# Patient Record
Sex: Female | Born: 1970 | Race: White | Hispanic: No | Smoking: Former smoker
Health system: Southern US, Community
[De-identification: ages and names within clinical notes are randomized; demographics above are authoritative.]

## PROBLEM LIST (undated history)

## (undated) DIAGNOSIS — I82409 Acute embolism and thrombosis of unspecified deep veins of unspecified lower extremity: Secondary | ICD-10-CM

## (undated) DIAGNOSIS — E039 Hypothyroidism, unspecified: Secondary | ICD-10-CM

## (undated) DIAGNOSIS — C50919 Malignant neoplasm of unspecified site of unspecified female breast: Secondary | ICD-10-CM

## (undated) DIAGNOSIS — M8580 Other specified disorders of bone density and structure, unspecified site: Secondary | ICD-10-CM

## (undated) DIAGNOSIS — E079 Disorder of thyroid, unspecified: Secondary | ICD-10-CM

## (undated) DIAGNOSIS — Z803 Family history of malignant neoplasm of breast: Secondary | ICD-10-CM

## (undated) DIAGNOSIS — S83522A Sprain of posterior cruciate ligament of left knee, initial encounter: Secondary | ICD-10-CM

## (undated) DIAGNOSIS — Z1509 Genetic susceptibility to other malignant neoplasm: Secondary | ICD-10-CM

## (undated) DIAGNOSIS — F329 Major depressive disorder, single episode, unspecified: Secondary | ICD-10-CM

## (undated) DIAGNOSIS — Z1501 Genetic susceptibility to malignant neoplasm of breast: Secondary | ICD-10-CM

## (undated) DIAGNOSIS — Z1379 Encounter for other screening for genetic and chromosomal anomalies: Secondary | ICD-10-CM

## (undated) DIAGNOSIS — Z1589 Genetic susceptibility to other disease: Secondary | ICD-10-CM

## (undated) DIAGNOSIS — F32A Depression, unspecified: Secondary | ICD-10-CM

## (undated) DIAGNOSIS — F4310 Post-traumatic stress disorder, unspecified: Secondary | ICD-10-CM

## (undated) DIAGNOSIS — F322 Major depressive disorder, single episode, severe without psychotic features: Secondary | ICD-10-CM

## (undated) DIAGNOSIS — S83242A Other tear of medial meniscus, current injury, left knee, initial encounter: Secondary | ICD-10-CM

## (undated) HISTORY — DX: Major depressive disorder, single episode, severe without psychotic features: F32.2

## (undated) HISTORY — DX: Disorder of thyroid, unspecified: E07.9

## (undated) HISTORY — DX: Post-traumatic stress disorder, unspecified: F43.10

## (undated) HISTORY — DX: Acute embolism and thrombosis of unspecified deep veins of unspecified lower extremity: I82.409

## (undated) HISTORY — DX: Genetic susceptibility to other malignant neoplasm: Z15.09

## (undated) HISTORY — DX: Genetic susceptibility to malignant neoplasm of breast: Z15.89

## (undated) HISTORY — DX: Family history of malignant neoplasm of breast: Z80.3

## (undated) HISTORY — DX: Depression, unspecified: F32.A

## (undated) HISTORY — DX: Other specified disorders of bone density and structure, unspecified site: M85.80

## (undated) HISTORY — DX: Major depressive disorder, single episode, unspecified: F32.9

## (undated) HISTORY — DX: Genetic susceptibility to malignant neoplasm of breast: Z15.01

## (undated) HISTORY — DX: Encounter for other screening for genetic and chromosomal anomalies: Z13.79

---

## 1998-06-16 ENCOUNTER — Emergency Department (HOSPITAL_COMMUNITY): Admission: EM | Admit: 1998-06-16 | Discharge: 1998-06-16 | Payer: Self-pay | Admitting: Emergency Medicine

## 1998-10-22 ENCOUNTER — Inpatient Hospital Stay (HOSPITAL_COMMUNITY): Admission: EM | Admit: 1998-10-22 | Discharge: 1998-10-24 | Payer: Self-pay | Admitting: Emergency Medicine

## 2003-11-05 ENCOUNTER — Emergency Department (HOSPITAL_COMMUNITY): Admission: EM | Admit: 2003-11-05 | Discharge: 2003-11-05 | Payer: Self-pay | Admitting: Emergency Medicine

## 2005-05-06 ENCOUNTER — Emergency Department (HOSPITAL_COMMUNITY): Admission: EM | Admit: 2005-05-06 | Discharge: 2005-05-06 | Payer: Self-pay | Admitting: Emergency Medicine

## 2005-10-29 ENCOUNTER — Inpatient Hospital Stay (HOSPITAL_COMMUNITY): Admission: AD | Admit: 2005-10-29 | Discharge: 2005-10-29 | Payer: Self-pay | Admitting: *Deleted

## 2006-06-01 ENCOUNTER — Emergency Department: Payer: Self-pay | Admitting: Emergency Medicine

## 2006-06-03 ENCOUNTER — Inpatient Hospital Stay (HOSPITAL_COMMUNITY): Admission: AD | Admit: 2006-06-03 | Discharge: 2006-06-03 | Payer: Self-pay | Admitting: Gynecology

## 2007-09-26 ENCOUNTER — Encounter (INDEPENDENT_AMBULATORY_CARE_PROVIDER_SITE_OTHER): Payer: Self-pay | Admitting: Unknown Physician Specialty

## 2007-09-26 ENCOUNTER — Other Ambulatory Visit: Admission: RE | Admit: 2007-09-26 | Discharge: 2007-09-26 | Payer: Self-pay | Admitting: Unknown Physician Specialty

## 2008-02-19 ENCOUNTER — Emergency Department (HOSPITAL_COMMUNITY): Admission: EM | Admit: 2008-02-19 | Discharge: 2008-02-19 | Payer: Self-pay | Admitting: Emergency Medicine

## 2008-04-01 ENCOUNTER — Ambulatory Visit: Payer: Self-pay | Admitting: Internal Medicine

## 2008-04-01 DIAGNOSIS — F32A Depression, unspecified: Secondary | ICD-10-CM | POA: Insufficient documentation

## 2008-04-01 DIAGNOSIS — E785 Hyperlipidemia, unspecified: Secondary | ICD-10-CM | POA: Insufficient documentation

## 2008-04-01 DIAGNOSIS — F329 Major depressive disorder, single episode, unspecified: Secondary | ICD-10-CM

## 2008-04-01 DIAGNOSIS — D509 Iron deficiency anemia, unspecified: Secondary | ICD-10-CM | POA: Insufficient documentation

## 2008-04-01 DIAGNOSIS — R259 Unspecified abnormal involuntary movements: Secondary | ICD-10-CM | POA: Insufficient documentation

## 2008-04-01 DIAGNOSIS — Z87442 Personal history of urinary calculi: Secondary | ICD-10-CM | POA: Insufficient documentation

## 2008-04-02 ENCOUNTER — Telehealth: Payer: Self-pay | Admitting: Internal Medicine

## 2008-04-05 ENCOUNTER — Telehealth: Payer: Self-pay | Admitting: Internal Medicine

## 2008-04-08 ENCOUNTER — Encounter (HOSPITAL_COMMUNITY): Admission: RE | Admit: 2008-04-08 | Discharge: 2008-04-09 | Payer: Self-pay | Admitting: Internal Medicine

## 2008-04-17 ENCOUNTER — Telehealth: Payer: Self-pay | Admitting: Internal Medicine

## 2008-04-23 ENCOUNTER — Encounter: Payer: Self-pay | Admitting: Internal Medicine

## 2008-04-26 ENCOUNTER — Ambulatory Visit: Payer: Self-pay | Admitting: Internal Medicine

## 2008-04-26 DIAGNOSIS — E0500 Thyrotoxicosis with diffuse goiter without thyrotoxic crisis or storm: Secondary | ICD-10-CM | POA: Insufficient documentation

## 2008-05-24 ENCOUNTER — Ambulatory Visit: Payer: Self-pay | Admitting: Internal Medicine

## 2008-07-25 ENCOUNTER — Ambulatory Visit: Payer: Self-pay | Admitting: Internal Medicine

## 2008-07-26 ENCOUNTER — Ambulatory Visit: Payer: Self-pay | Admitting: Internal Medicine

## 2008-07-30 ENCOUNTER — Telehealth: Payer: Self-pay | Admitting: Internal Medicine

## 2008-07-30 LAB — CONVERTED CEMR LAB: TSH: 73.46 microintl units/mL — ABNORMAL HIGH (ref 0.35–5.50)

## 2008-08-13 ENCOUNTER — Ambulatory Visit: Payer: Self-pay | Admitting: Internal Medicine

## 2008-08-13 DIAGNOSIS — J029 Acute pharyngitis, unspecified: Secondary | ICD-10-CM | POA: Insufficient documentation

## 2009-02-24 ENCOUNTER — Ambulatory Visit: Payer: Self-pay | Admitting: Internal Medicine

## 2009-02-24 DIAGNOSIS — E039 Hypothyroidism, unspecified: Secondary | ICD-10-CM | POA: Insufficient documentation

## 2009-03-24 ENCOUNTER — Ambulatory Visit: Payer: Self-pay | Admitting: Internal Medicine

## 2009-09-24 ENCOUNTER — Ambulatory Visit (HOSPITAL_COMMUNITY): Admission: RE | Admit: 2009-09-24 | Discharge: 2009-09-24 | Payer: Self-pay | Admitting: Family Medicine

## 2009-09-30 ENCOUNTER — Encounter: Admission: RE | Admit: 2009-09-30 | Discharge: 2009-09-30 | Payer: Self-pay | Admitting: General Surgery

## 2009-10-08 ENCOUNTER — Encounter (INDEPENDENT_AMBULATORY_CARE_PROVIDER_SITE_OTHER): Payer: Self-pay | Admitting: General Surgery

## 2009-10-08 ENCOUNTER — Inpatient Hospital Stay (HOSPITAL_COMMUNITY): Admission: RE | Admit: 2009-10-08 | Discharge: 2009-10-10 | Payer: Self-pay | Admitting: General Surgery

## 2009-10-08 HISTORY — PX: MODIFIED RADICAL MASTECTOMY: SHX5703

## 2009-11-05 ENCOUNTER — Encounter (HOSPITAL_COMMUNITY): Admission: RE | Admit: 2009-11-05 | Discharge: 2009-12-05 | Payer: Self-pay | Admitting: Oncology

## 2009-11-05 ENCOUNTER — Ambulatory Visit (HOSPITAL_COMMUNITY): Payer: Self-pay | Admitting: Oncology

## 2009-11-07 ENCOUNTER — Ambulatory Visit: Payer: Self-pay | Admitting: Cardiology

## 2009-11-07 ENCOUNTER — Ambulatory Visit: Admission: RE | Admit: 2009-11-07 | Discharge: 2010-01-19 | Payer: Self-pay | Admitting: Radiation Oncology

## 2009-11-07 ENCOUNTER — Encounter (HOSPITAL_COMMUNITY): Payer: Self-pay | Admitting: Oncology

## 2009-11-14 ENCOUNTER — Ambulatory Visit (HOSPITAL_COMMUNITY): Admission: RE | Admit: 2009-11-14 | Discharge: 2009-11-14 | Payer: Self-pay | Admitting: General Surgery

## 2009-11-14 HISTORY — PX: PORTACATH PLACEMENT: SHX2246

## 2009-11-24 ENCOUNTER — Ambulatory Visit: Payer: Self-pay | Admitting: Internal Medicine

## 2009-12-16 ENCOUNTER — Encounter (HOSPITAL_COMMUNITY): Admission: RE | Admit: 2009-12-16 | Discharge: 2010-01-15 | Payer: Self-pay | Admitting: Oncology

## 2009-12-30 ENCOUNTER — Ambulatory Visit (HOSPITAL_COMMUNITY): Payer: Self-pay | Admitting: Oncology

## 2010-01-20 ENCOUNTER — Encounter (HOSPITAL_COMMUNITY): Admission: RE | Admit: 2010-01-20 | Discharge: 2010-02-19 | Payer: Self-pay | Admitting: Oncology

## 2010-02-17 ENCOUNTER — Ambulatory Visit (HOSPITAL_COMMUNITY): Payer: Self-pay | Admitting: Oncology

## 2010-02-24 ENCOUNTER — Encounter (HOSPITAL_COMMUNITY): Admission: RE | Admit: 2010-02-24 | Discharge: 2010-03-26 | Payer: Self-pay | Admitting: Oncology

## 2010-03-10 ENCOUNTER — Encounter: Payer: Self-pay | Admitting: Internal Medicine

## 2010-03-31 ENCOUNTER — Encounter (HOSPITAL_COMMUNITY): Admission: RE | Admit: 2010-03-31 | Discharge: 2010-04-30 | Payer: Self-pay | Admitting: Oncology

## 2010-04-10 ENCOUNTER — Ambulatory Visit: Admission: RE | Admit: 2010-04-10 | Discharge: 2010-06-17 | Payer: Self-pay | Admitting: Radiation Oncology

## 2010-05-06 ENCOUNTER — Ambulatory Visit (HOSPITAL_COMMUNITY): Admission: RE | Admit: 2010-05-06 | Discharge: 2010-05-06 | Payer: Self-pay | Admitting: Neurosurgery

## 2010-05-12 ENCOUNTER — Ambulatory Visit (HOSPITAL_COMMUNITY): Payer: Self-pay | Admitting: Oncology

## 2010-06-19 ENCOUNTER — Encounter (HOSPITAL_COMMUNITY)
Admission: RE | Admit: 2010-06-19 | Discharge: 2010-07-19 | Payer: Self-pay | Source: Home / Self Care | Attending: Oncology | Admitting: Oncology

## 2010-06-19 ENCOUNTER — Encounter: Payer: Self-pay | Admitting: Internal Medicine

## 2010-06-30 ENCOUNTER — Ambulatory Visit (HOSPITAL_COMMUNITY): Payer: Self-pay | Admitting: Oncology

## 2010-07-15 ENCOUNTER — Ambulatory Visit (HOSPITAL_COMMUNITY)
Admission: RE | Admit: 2010-07-15 | Discharge: 2010-07-15 | Payer: Self-pay | Source: Home / Self Care | Attending: General Surgery | Admitting: General Surgery

## 2010-07-15 HISTORY — PX: PORT-A-CATH REMOVAL: SHX5289

## 2010-08-30 ENCOUNTER — Encounter (HOSPITAL_COMMUNITY): Payer: Self-pay | Admitting: Oncology

## 2010-08-30 ENCOUNTER — Encounter: Payer: Self-pay | Admitting: Radiation Oncology

## 2010-09-10 NOTE — Letter (Signed)
Summary: [facility]  [facility]   Imported By: Maryln Gottron 04/10/2010 12:47:07  _____________________________________________________________________  External Attachment:    Type:   Image     Comment:   External Document

## 2010-09-10 NOTE — Letter (Signed)
Summary: [facility]  [facility]   Imported By: Maryln Gottron 07/08/2010 14:41:47  _____________________________________________________________________  External Attachment:    Type:   Image     Comment:   External Document

## 2010-09-10 NOTE — Assessment & Plan Note (Signed)
Summary: fu on med/njr   Vital Signs:  Patient profile:   40 year old female Weight:      145 pounds Temp:     98.6 degrees F oral BP sitting:   100 / 68  (right arm) Cuff size:   regular  Vitals Entered By: Duard Brady LPN (November 24, 2009 3:31 PM) CC: med review - had mastectomy (L) mar 2, 20100,   portacath (R) upper chest - started chemo this week Is Patient Diabetic? No   CC:  med review - had mastectomy (L) mar 2, 20100, and portacath (R) upper chest - started chemo this week.  History of Present Illness: 40 year old patient has a history of hypothyroidism, status post I-131 treatment for Graves' disease.  She is seen today for follow-up.  Since her last visit here.  She has been diagnosed with stage IIIa breast cancer with 2 of  3 positive lymph nodes.  She is status post left mastectomy on March 2 and has completed her first round of chemotherapy last week  Preventive Screening-Counseling & Management  Alcohol-Tobacco     Smoking Status: current  Allergies (verified): No Known Drug Allergies  Past History:  Past Medical History: seizure disorder, and posttraumatic  at age 23:  formally treated with phenobarbital, but apparently has been seizure-free for over 10 years Hyperlipidemia Nephrolithiasis, hx of Anemia-iron deficiency Depression Graves' disease status post I-131 April 23, 2008 Hypothyroidism left breast cancer ([facility]), Ne[facility]rom, [facility]   Past Surgical History: gravida two, para two, abortus zero status post left mastectomy October 08, 2009 insertion of Port-A-Cath  Physical Exam  General:  overweight-appearing.  low-normal blood pressureoverweight-appearing.   Head:  Normocephalic and atraumatic without obvious abnormalities. No apparent alopecia or balding. Eyes:  No corneal or conjunctival inflammation noted. EOMI. Perrla. Funduscopic exam benign, without hemorrhages, exudates or papilledema. Vision grossly normal. Mouth:   Oral mucosa and oropharynx without lesions or exudates.  Teeth in good repair. Neck:  No deformities, masses, or tenderness noted. Lungs:  Normal respiratory effort, chest expands symmetrically. Lungs are clear to auscultation, no crackles or wheezes. Heart:  Normal rate and regular rhythm. S1 and S2 normal without gallop, murmur, click, rub or other extra sounds. Abdomen:  Bowel sounds positive,abdomen soft and non-tender without masses, organomegaly or hernias noted. Msk:  No deformity or scoliosis noted of thoracic or lumbar spine.     Impression & Recommendations:  Problem # 1:  ADENOCARCINOMA, LEFT BREAST (ICD-174.9)  Problem # 2:  HYPOTHYROIDISM (ICD-244.9)  Her updated medication list for this problem includes:    Synthroid 100 Mcg Tabs (Levothyroxine sodium) .Marland Kitchen... 1 once daily  Orders: Venipuncture (45809) TLB-TSH (Thyroid Stimulating Hormone) (84443-TSH)  Her updated medication list for this problem includes:    Synthroid 100 Mcg Tabs (Levothyroxine sodium) .Marland Kitchen... 1 once daily  Complete Medication List: 1)  Synthroid 100 Mcg Tabs (Levothyroxine sodium) .Marland Kitchen.. 1 once daily 2)  Ativan 1 Mg Tabs (Lorazepam) .... Prn  Patient Instructions: 1)  Please schedule a follow-up appointment in 6 months. 2)  It is important that you exercise regularly at least 20 minutes 5 times a week. If you develop chest pain, have severe difficulty breathing, or feel very tired , stop exercising immediately and seek medical attention. 3)  You need to lose weight. Consider a lower calorie diet and regular exercise.  Prescriptions: SYNTHROID 100 MCG TABS (LEVOTHYROXINE SODIUM) 1 once daily  #90 x 6   Entered and Authorized by:   Gordy Savers  MD   Signed by:   Gordy Savers  MD on 11/24/2009   Method used:   Print then Give to Patient   RxID:   [id number] SYNTHROID 100 MCG TABS (LEVOTHYROXINE SODIUM) 1 once daily  #90 x 6   Entered and Authorized by:   Gordy Savers  MD    Signed by:   Gordy Savers  MD on 11/24/2009   Method used:   Electronically to        [facility]  [address] (retail)       [address]       [place], [place]  [address]       Ph: [phone]       Fax: [phone]   RxID:   [id number]

## 2010-09-11 ENCOUNTER — Ambulatory Visit (HOSPITAL_COMMUNITY): Admission: RE | Admit: 2010-09-11 | Payer: Self-pay | Source: Ambulatory Visit | Admitting: Oncology

## 2010-09-11 ENCOUNTER — Ambulatory Visit (HOSPITAL_COMMUNITY): Payer: Medicaid Other | Admitting: Oncology

## 2010-09-11 ENCOUNTER — Encounter (HOSPITAL_COMMUNITY): Admission: RE | Admit: 2010-09-11 | Payer: Self-pay | Source: Home / Self Care | Admitting: Oncology

## 2010-09-11 DIAGNOSIS — C50919 Malignant neoplasm of unspecified site of unspecified female breast: Secondary | ICD-10-CM

## 2010-10-16 ENCOUNTER — Ambulatory Visit (HOSPITAL_COMMUNITY): Payer: Medicaid Other | Admitting: Oncology

## 2010-10-16 DIAGNOSIS — C50919 Malignant neoplasm of unspecified site of unspecified female breast: Secondary | ICD-10-CM

## 2010-10-20 LAB — COMPREHENSIVE METABOLIC PANEL
ALT: 15 U/L (ref 0–35)
Albumin: 4.2 g/dL (ref 3.5–5.2)
Alkaline Phosphatase: 79 U/L (ref 39–117)
BUN: 13 mg/dL (ref 6–23)
Calcium: 9.8 mg/dL (ref 8.4–10.5)
Potassium: 3.5 mEq/L (ref 3.5–5.1)
Sodium: 131 mEq/L — ABNORMAL LOW (ref 135–145)
Total Protein: 7.3 g/dL (ref 6.0–8.3)

## 2010-10-20 LAB — CBC
HCT: 39.3 % (ref 36.0–46.0)
Hemoglobin: 13.3 g/dL (ref 12.0–15.0)
MCV: 98.8 fL (ref 78.0–100.0)
RDW: 15.2 % (ref 11.5–15.5)
WBC: 7.1 10*3/uL (ref 4.0–10.5)

## 2010-10-20 LAB — DIFFERENTIAL
Basophils Relative: 0 % (ref 0–1)
Lymphs Abs: 1.2 10*3/uL (ref 0.7–4.0)
Monocytes Absolute: 0.5 10*3/uL (ref 0.1–1.0)
Monocytes Relative: 8 % (ref 3–12)
Neutro Abs: 5.1 10*3/uL (ref 1.7–7.7)
Neutrophils Relative %: 72 % (ref 43–77)

## 2010-10-22 ENCOUNTER — Ambulatory Visit (HOSPITAL_COMMUNITY): Payer: Medicaid Other | Admitting: Oncology

## 2010-10-22 ENCOUNTER — Other Ambulatory Visit (HOSPITAL_COMMUNITY): Payer: Self-pay | Admitting: Oncology

## 2010-10-22 DIAGNOSIS — Z853 Personal history of malignant neoplasm of breast: Secondary | ICD-10-CM

## 2010-10-22 DIAGNOSIS — Z9010 Acquired absence of unspecified breast and nipple: Secondary | ICD-10-CM

## 2010-10-22 DIAGNOSIS — M79622 Pain in left upper arm: Secondary | ICD-10-CM

## 2010-10-22 DIAGNOSIS — C50919 Malignant neoplasm of unspecified site of unspecified female breast: Secondary | ICD-10-CM

## 2010-10-22 DIAGNOSIS — Z139 Encounter for screening, unspecified: Secondary | ICD-10-CM

## 2010-10-23 LAB — DIFFERENTIAL
Band Neutrophils: 0 % (ref 0–10)
Basophils Absolute: 0 10*3/uL (ref 0.0–0.1)
Basophils Absolute: 0 10*3/uL (ref 0.0–0.1)
Basophils Relative: 0 % (ref 0–1)
Blasts: 0 %
Eosinophils Absolute: 0 10*3/uL (ref 0.0–0.7)
Eosinophils Absolute: 0 10*3/uL (ref 0.0–0.7)
Eosinophils Absolute: 0 10*3/uL (ref 0.0–0.7)
Eosinophils Relative: 0 % (ref 0–5)
Eosinophils Relative: 0 % (ref 0–5)
Eosinophils Relative: 0 % (ref 0–5)
Eosinophils Relative: 0 % (ref 0–5)
Lymphocytes Relative: 7 % — ABNORMAL LOW (ref 12–46)
Lymphocytes Relative: 8 % — ABNORMAL LOW (ref 12–46)
Lymphs Abs: 0.6 10*3/uL — ABNORMAL LOW (ref 0.7–4.0)
Lymphs Abs: 0.7 10*3/uL (ref 0.7–4.0)
Monocytes Absolute: 0.2 10*3/uL (ref 0.1–1.0)
Monocytes Absolute: 0.1 10*3/uL (ref 0.1–1.0)
Monocytes Relative: 2 % — ABNORMAL LOW (ref 3–12)
Monocytes Relative: 2 % — ABNORMAL LOW (ref 3–12)
Neutro Abs: 8.2 10*3/uL — ABNORMAL HIGH (ref 1.7–7.7)
Neutro Abs: 6 10*3/uL (ref 1.7–7.7)
Neutrophils Relative %: 91 % — ABNORMAL HIGH (ref 43–77)
Neutrophils Relative %: 88 % — ABNORMAL HIGH (ref 43–77)

## 2010-10-23 LAB — CBC
HCT: 33 % — ABNORMAL LOW (ref 36.0–46.0)
Hemoglobin: 11.4 g/dL — ABNORMAL LOW (ref 12.0–15.0)
Hemoglobin: 11.5 g/dL — ABNORMAL LOW (ref 12.0–15.0)
MCH: 37 pg — ABNORMAL HIGH (ref 26.0–34.0)
MCH: 37.4 pg — ABNORMAL HIGH (ref 26.0–34.0)
MCHC: 33.9 g/dL (ref 30.0–36.0)
MCV: 108.5 fL — ABNORMAL HIGH (ref 78.0–100.0)
MCV: 109.4 fL — ABNORMAL HIGH (ref 78.0–100.0)
MCV: 109.8 fL — ABNORMAL HIGH (ref 78.0–100.0)
Platelets: 182 10*3/uL (ref 150–400)
Platelets: 213 10*3/uL (ref 150–400)
RBC: 3.05 MIL/uL — ABNORMAL LOW (ref 3.87–5.11)
RBC: 3.09 MIL/uL — ABNORMAL LOW (ref 3.87–5.11)
RDW: 14.8 % (ref 11.5–15.5)
RDW: 15.5 % (ref 11.5–15.5)
RDW: 16.3 % — ABNORMAL HIGH (ref 11.5–15.5)
WBC: 8.5 10*3/uL (ref 4.0–10.5)
WBC: 5.2 10*3/uL (ref 4.0–10.5)
WBC: 6.6 10*3/uL (ref 4.0–10.5)

## 2010-10-23 LAB — COMPREHENSIVE METABOLIC PANEL
ALT: 13 U/L (ref 0–35)
AST: 17 U/L (ref 0–37)
Albumin: 3.8 g/dL (ref 3.5–5.2)
Alkaline Phosphatase: 87 U/L (ref 39–117)
BUN: 11 mg/dL (ref 6–23)
CO2: 24 mEq/L (ref 19–32)
Calcium: 9.4 mg/dL (ref 8.4–10.5)
Chloride: 105 mEq/L (ref 96–112)
Creatinine, Ser: 0.73 mg/dL (ref 0.4–1.2)
GFR calc Af Amer: >60 mL/min (ref >60)
GFR calc Af Amer: >60 mL/min (ref >60)
GFR calc non Af Amer: >60 mL/min (ref >60)
Glucose, Bld: 144 mg/dL — ABNORMAL HIGH (ref 70–99)
Potassium: 3.3 mEq/L — ABNORMAL LOW (ref 3.5–5.1)
Sodium: 135 mEq/L (ref 135–145)
Total Bilirubin: 0.3 mg/dL (ref 0.3–1.2)
Total Protein: 7.3 g/dL (ref 6.0–8.3)

## 2010-10-24 LAB — CBC
Hemoglobin: 11.3 g/dL — ABNORMAL LOW (ref 12.0–15.0)
MCH: 36.7 pg — ABNORMAL HIGH (ref 26.0–34.0)
MCH: 37.2 pg — ABNORMAL HIGH (ref 26.0–34.0)
MCHC: 34 g/dL (ref 30.0–36.0)
MCHC: 34.1 g/dL (ref 30.0–36.0)
MCV: 109.4 fL — ABNORMAL HIGH (ref 78.0–100.0)
Platelets: 209 10*3/uL (ref 150–400)
RDW: 17.3 % — ABNORMAL HIGH (ref 11.5–15.5)

## 2010-10-24 LAB — DIFFERENTIAL
Basophils Absolute: 0 10*3/uL (ref 0.0–0.1)
Basophils Relative: 0 % (ref 0–1)
Eosinophils Absolute: 0 10*3/uL (ref 0.0–0.7)
Eosinophils Absolute: 0 10*3/uL (ref 0.0–0.7)
Eosinophils Relative: 0 % (ref 0–5)
Monocytes Relative: 1 % — ABNORMAL LOW (ref 3–12)
Neutrophils Relative %: 88 % — ABNORMAL HIGH (ref 43–77)

## 2010-10-24 LAB — URINALYSIS, ROUTINE W REFLEX MICROSCOPIC
Bilirubin Urine: NEGATIVE
Hgb urine dipstick: NEGATIVE
Protein, ur: NEGATIVE mg/dL
Urobilinogen, UA: 0.2 mg/dL (ref 0.0–1.0)

## 2010-10-25 LAB — DIFFERENTIAL
Basophils Absolute: 0 10*3/uL (ref 0.0–0.1)
Basophils Absolute: 0 10*3/uL (ref 0.0–0.1)
Basophils Absolute: 0 10*3/uL (ref 0.0–0.1)
Basophils Relative: 0 % (ref 0–1)
Basophils Relative: 0 % (ref 0–1)
Basophils Relative: 0 % (ref 0–1)
Eosinophils Absolute: 0 10*3/uL (ref 0.0–0.7)
Eosinophils Relative: 0 % (ref 0–5)
Lymphocytes Relative: 5 % — ABNORMAL LOW (ref 12–46)
Lymphocytes Relative: 6 % — ABNORMAL LOW (ref 12–46)
Lymphs Abs: 0.4 10*3/uL — ABNORMAL LOW (ref 0.7–4.0)
Monocytes Absolute: 0.1 10*3/uL (ref 0.1–1.0)
Monocytes Absolute: 0.3 10*3/uL (ref 0.1–1.0)
Monocytes Relative: 3 % (ref 3–12)
Neutro Abs: 7 10*3/uL (ref 1.7–7.7)
Neutro Abs: 8 10*3/uL — ABNORMAL HIGH (ref 1.7–7.7)
Neutro Abs: 7.8 10*3/uL — ABNORMAL HIGH (ref 1.7–7.7)
Neutro Abs: 8.2 10*3/uL — ABNORMAL HIGH (ref 1.7–7.7)
Neutrophils Relative %: 93 % — ABNORMAL HIGH (ref 43–77)
Neutrophils Relative %: 92 % — ABNORMAL HIGH (ref 43–77)
Neutrophils Relative %: 93 % — ABNORMAL HIGH (ref 43–77)

## 2010-10-25 LAB — CBC
HCT: 30.3 % — ABNORMAL LOW (ref 36.0–46.0)
HCT: 30 % — ABNORMAL LOW (ref 36.0–46.0)
Hemoglobin: 10.1 g/dL — ABNORMAL LOW (ref 12.0–15.0)
Hemoglobin: 10.3 g/dL — ABNORMAL LOW (ref 12.0–15.0)
MCHC: 33.7 g/dL (ref 30.0–36.0)
Platelets: 182 10*3/uL (ref 150–400)
Platelets: 200 10*3/uL (ref 150–400)
RBC: 2.94 MIL/uL — ABNORMAL LOW (ref 3.87–5.11)
RDW: 19.9 % — ABNORMAL HIGH (ref 11.5–15.5)
RDW: 20.4 % — ABNORMAL HIGH (ref 11.5–15.5)
RDW: 20.3 % — ABNORMAL HIGH (ref 11.5–15.5)
RDW: 21 % — ABNORMAL HIGH (ref 11.5–15.5)
WBC: 7.5 10*3/uL (ref 4.0–10.5)
WBC: 8.7 10*3/uL (ref 4.0–10.5)
WBC: 8.5 10*3/uL (ref 4.0–10.5)

## 2010-10-25 LAB — COMPREHENSIVE METABOLIC PANEL
ALT: 15 U/L (ref 0–35)
ALT: 21 U/L (ref 0–35)
Albumin: 3.6 g/dL (ref 3.5–5.2)
Alkaline Phosphatase: 69 U/L (ref 39–117)
Alkaline Phosphatase: 76 U/L (ref 39–117)
BUN: 12 mg/dL (ref 6–23)
BUN: 13 mg/dL (ref 6–23)
CO2: 25 mEq/L (ref 19–32)
Calcium: 9.7 mg/dL (ref 8.4–10.5)
Chloride: 101 mEq/L (ref 96–112)
GFR calc non Af Amer: >60 mL/min (ref >60)
Glucose, Bld: 163 mg/dL — ABNORMAL HIGH (ref 70–99)
Glucose, Bld: 173 mg/dL — ABNORMAL HIGH (ref 70–99)
Potassium: 3.4 mEq/L — ABNORMAL LOW (ref 3.5–5.1)
Potassium: 3.5 mEq/L (ref 3.5–5.1)
Sodium: 134 mEq/L — ABNORMAL LOW (ref 135–145)
Sodium: 136 mEq/L (ref 135–145)
Total Bilirubin: 0.4 mg/dL (ref 0.3–1.2)
Total Protein: 6.7 g/dL (ref 6.0–8.3)
Total Protein: 6.9 g/dL (ref 6.0–8.3)

## 2010-10-26 LAB — COMPREHENSIVE METABOLIC PANEL
ALT: 12 U/L (ref 0–35)
AST: 15 U/L (ref 0–37)
Alkaline Phosphatase: 115 U/L (ref 39–117)
CO2: 28 mEq/L (ref 19–32)
Calcium: 9 mg/dL (ref 8.4–10.5)
GFR calc Af Amer: >60 mL/min (ref >60)
GFR calc non Af Amer: >60 mL/min (ref >60)
Potassium: 3.7 mEq/L (ref 3.5–5.1)
Sodium: 138 mEq/L (ref 135–145)
Total Protein: 6.6 g/dL (ref 6.0–8.3)

## 2010-10-26 LAB — DIFFERENTIAL
Basophils Absolute: 0 10*3/uL (ref 0.0–0.1)
Basophils Absolute: 0 10*3/uL (ref 0.0–0.1)
Basophils Relative: 0 % (ref 0–1)
Basophils Relative: 1 % (ref 0–1)
Basophils Relative: 0 % (ref 0–1)
Eosinophils Absolute: 0 10*3/uL (ref 0.0–0.7)
Eosinophils Absolute: 0 10*3/uL (ref 0.0–0.7)
Eosinophils Absolute: 0.1 10*3/uL (ref 0.0–0.7)
Eosinophils Relative: 0 % (ref 0–5)
Eosinophils Relative: 1 % (ref 0–5)
Lymphs Abs: 0.6 10*3/uL — ABNORMAL LOW (ref 0.7–4.0)
Metamyelocytes Relative: 1 %
Monocytes Absolute: 0.5 10*3/uL (ref 0.1–1.0)
Monocytes Relative: 2 % — ABNORMAL LOW (ref 3–12)
Monocytes Relative: 3 % (ref 3–12)
Myelocytes: 0 %
Neutro Abs: 9.7 10*3/uL — ABNORMAL HIGH (ref 1.7–7.7)
Neutrophils Relative %: 92 % — ABNORMAL HIGH (ref 43–77)
Neutrophils Relative %: 94 % — ABNORMAL HIGH (ref 43–77)
Promyelocytes Absolute: 0 %

## 2010-10-26 LAB — CBC
HCT: 31 % — ABNORMAL LOW (ref 36.0–46.0)
Hemoglobin: 10.5 g/dL — ABNORMAL LOW (ref 12.0–15.0)
Hemoglobin: 10.7 g/dL — ABNORMAL LOW (ref 12.0–15.0)
MCHC: 33.9 g/dL (ref 30.0–36.0)
MCHC: 34 g/dL (ref 30.0–36.0)
MCHC: 34.6 g/dL (ref 30.0–36.0)
MCV: 100.3 fL — ABNORMAL HIGH (ref 78.0–100.0)
MCV: 99.8 fL (ref 78.0–100.0)
Platelets: 272 10*3/uL (ref 150–400)
RBC: 3.04 MIL/uL — ABNORMAL LOW (ref 3.87–5.11)
RBC: 3.09 MIL/uL — ABNORMAL LOW (ref 3.87–5.11)
RBC: 3.15 MIL/uL — ABNORMAL LOW (ref 3.87–5.11)
RDW: 18.6 % — ABNORMAL HIGH (ref 11.5–15.5)
WBC: 16 10*3/uL — ABNORMAL HIGH (ref 4.0–10.5)

## 2010-10-27 LAB — DIFFERENTIAL
Basophils Absolute: 0 10*3/uL (ref 0.0–0.1)
Basophils Absolute: 0 10*3/uL (ref 0.0–0.1)
Basophils Relative: 0 % (ref 0–1)
Lymphocytes Relative: 12 % (ref 12–46)
Lymphs Abs: 1.8 10*3/uL (ref 0.7–4.0)
Monocytes Absolute: 1.1 10*3/uL — ABNORMAL HIGH (ref 0.1–1.0)
Monocytes Relative: 7 % (ref 3–12)
Monocytes Relative: 8 % (ref 3–12)
Neutro Abs: 12.2 10*3/uL — ABNORMAL HIGH (ref 1.7–7.7)
Neutro Abs: 9.7 10*3/uL — ABNORMAL HIGH (ref 1.7–7.7)
Neutrophils Relative %: 73 % (ref 43–77)

## 2010-10-27 LAB — CBC
HCT: 34.2 % — ABNORMAL LOW (ref 36.0–46.0)
Hemoglobin: 11.9 g/dL — ABNORMAL LOW (ref 12.0–15.0)
Hemoglobin: 12.2 g/dL (ref 12.0–15.0)
MCHC: 35.6 g/dL (ref 30.0–36.0)
MCV: 97.5 fL (ref 78.0–100.0)
RBC: 3.44 MIL/uL — ABNORMAL LOW (ref 3.87–5.11)
RDW: 13.6 % (ref 11.5–15.5)
WBC: 15.1 10*3/uL — ABNORMAL HIGH (ref 4.0–10.5)

## 2010-10-27 LAB — COMPREHENSIVE METABOLIC PANEL
Alkaline Phosphatase: 101 U/L (ref 39–117)
BUN: 14 mg/dL (ref 6–23)
Glucose, Bld: 86 mg/dL (ref 70–99)
Potassium: 3.8 mEq/L (ref 3.5–5.1)
Total Protein: 6.7 g/dL (ref 6.0–8.3)

## 2010-10-28 ENCOUNTER — Ambulatory Visit (HOSPITAL_COMMUNITY)
Admission: RE | Admit: 2010-10-28 | Discharge: 2010-10-28 | Disposition: A | Discharge status: Home / Self Care | Payer: Medicaid Other | Source: Ambulatory Visit | Attending: Oncology | Admitting: Oncology

## 2010-10-28 DIAGNOSIS — N644 Mastodynia: Secondary | ICD-10-CM | POA: Insufficient documentation

## 2010-10-28 DIAGNOSIS — Z853 Personal history of malignant neoplasm of breast: Secondary | ICD-10-CM | POA: Insufficient documentation

## 2010-10-28 DIAGNOSIS — M79622 Pain in left upper arm: Secondary | ICD-10-CM

## 2010-10-28 DIAGNOSIS — R0789 Other chest pain: Secondary | ICD-10-CM | POA: Insufficient documentation

## 2010-10-28 LAB — DIFFERENTIAL
Lymphocytes Relative: 24 % (ref 12–46)
Lymphs Abs: 2.3 10*3/uL (ref 0.7–4.0)
Monocytes Absolute: 0.6 10*3/uL (ref 0.1–1.0)
Monocytes Relative: 7 % (ref 3–12)
Neutro Abs: 6.3 10*3/uL (ref 1.7–7.7)

## 2010-10-28 LAB — CBC
Hemoglobin: 13.5 g/dL (ref 12.0–15.0)
MCHC: 33.6 g/dL (ref 30.0–36.0)
RBC: 4.03 MIL/uL (ref 3.87–5.11)

## 2010-10-29 ENCOUNTER — Ambulatory Visit (HOSPITAL_COMMUNITY)
Admission: RE | Admit: 2010-10-29 | Discharge: 2010-10-29 | Disposition: A | Discharge status: Home / Self Care | Payer: Medicaid Other | Source: Ambulatory Visit | Attending: Oncology | Admitting: Oncology

## 2010-10-29 DIAGNOSIS — Z9010 Acquired absence of unspecified breast and nipple: Secondary | ICD-10-CM

## 2010-10-29 DIAGNOSIS — Z1231 Encounter for screening mammogram for malignant neoplasm of breast: Secondary | ICD-10-CM | POA: Insufficient documentation

## 2010-10-29 DIAGNOSIS — Z139 Encounter for screening, unspecified: Secondary | ICD-10-CM

## 2010-10-29 LAB — CROSSMATCH: ABO/RH(D): A POS

## 2010-10-29 LAB — COMPREHENSIVE METABOLIC PANEL
ALT: 14 U/L (ref 0–35)
CO2: 31 mEq/L (ref 19–32)
Calcium: 9.4 mg/dL (ref 8.4–10.5)
Creatinine, Ser: 0.71 mg/dL (ref 0.4–1.2)
GFR calc non Af Amer: >60 mL/min (ref >60)
Glucose, Bld: 86 mg/dL (ref 70–99)

## 2010-10-29 LAB — CBC
HCT: 41 % (ref 36.0–46.0)
Hemoglobin: 14 g/dL (ref 12.0–15.0)
MCHC: 34.2 g/dL (ref 30.0–36.0)
MCV: 100.8 fL — ABNORMAL HIGH (ref 78.0–100.0)
RBC: 4.07 MIL/uL (ref 3.87–5.11)

## 2010-11-02 LAB — DIFFERENTIAL
Basophils Absolute: 0 10*3/uL (ref 0.0–0.1)
Basophils Relative: 0 % (ref 0–1)
Basophils Relative: 0 % (ref 0–1)
Eosinophils Absolute: 0.2 10*3/uL (ref 0.0–0.7)
Lymphocytes Relative: 24 % (ref 12–46)
Monocytes Absolute: 0.6 10*3/uL (ref 0.1–1.0)
Monocytes Relative: 7 % (ref 3–12)
Neutro Abs: 7.8 10*3/uL — ABNORMAL HIGH (ref 1.7–7.7)
Neutrophils Relative %: 69 % (ref 43–77)
Neutrophils Relative %: 69 % (ref 43–77)

## 2010-11-02 LAB — CBC
Hemoglobin: 10.8 g/dL — ABNORMAL LOW (ref 12.0–15.0)
Hemoglobin: 13.4 g/dL (ref 12.0–15.0)
Platelets: 143 10*3/uL — ABNORMAL LOW (ref 150–400)
RBC: 3.96 MIL/uL (ref 3.87–5.11)
RDW: 14.2 % (ref 11.5–15.5)

## 2010-11-02 LAB — BASIC METABOLIC PANEL
Calcium: 8.2 mg/dL — ABNORMAL LOW (ref 8.4–10.5)
Creatinine, Ser: 0.57 mg/dL (ref 0.4–1.2)
GFR calc non Af Amer: >60 mL/min (ref >60)
Glucose, Bld: 92 mg/dL (ref 70–99)
Sodium: 135 mEq/L (ref 135–145)

## 2010-11-02 LAB — COMPREHENSIVE METABOLIC PANEL
ALT: 13 U/L (ref 0–35)
Alkaline Phosphatase: 84 U/L (ref 39–117)
CO2: 31 mEq/L (ref 19–32)
Chloride: 104 mEq/L (ref 96–112)
GFR calc non Af Amer: >60 mL/min (ref >60)
Glucose, Bld: 85 mg/dL (ref 70–99)
Potassium: 4 mEq/L (ref 3.5–5.1)
Sodium: 140 mEq/L (ref 135–145)
Total Bilirubin: 0.4 mg/dL (ref 0.3–1.2)

## 2010-11-02 LAB — HEMOGLOBIN AND HEMATOCRIT, BLOOD
HCT: 30.7 % — ABNORMAL LOW (ref 36.0–46.0)
Hemoglobin: 10.4 g/dL — ABNORMAL LOW (ref 12.0–15.0)

## 2010-11-02 LAB — LUTEINIZING HORMONE: LH: 57 m[IU]/mL

## 2010-12-03 ENCOUNTER — Encounter (HOSPITAL_COMMUNITY): Payer: Medicaid Other | Attending: Oncology

## 2010-12-03 DIAGNOSIS — C50919 Malignant neoplasm of unspecified site of unspecified female breast: Secondary | ICD-10-CM

## 2010-12-03 DIAGNOSIS — Z79899 Other long term (current) drug therapy: Secondary | ICD-10-CM | POA: Insufficient documentation

## 2010-12-04 ENCOUNTER — Other Ambulatory Visit (HOSPITAL_COMMUNITY): Payer: Medicaid Other

## 2010-12-04 ENCOUNTER — Ambulatory Visit (HOSPITAL_COMMUNITY): Payer: Medicaid Other | Admitting: Oncology

## 2010-12-07 ENCOUNTER — Other Ambulatory Visit: Payer: Self-pay | Admitting: Internal Medicine

## 2010-12-08 ENCOUNTER — Encounter (HOSPITAL_COMMUNITY): Payer: Medicaid Other | Attending: Oncology | Admitting: Oncology

## 2010-12-08 DIAGNOSIS — C50919 Malignant neoplasm of unspecified site of unspecified female breast: Secondary | ICD-10-CM

## 2011-01-13 ENCOUNTER — Encounter (HOSPITAL_COMMUNITY)
Admission: RE | Admit: 2011-01-13 | Discharge: 2011-01-13 | Disposition: A | Discharge status: Home / Self Care | Payer: Medicaid Other | Source: Ambulatory Visit | Attending: Plastic Surgery | Admitting: Plastic Surgery

## 2011-01-13 LAB — BASIC METABOLIC PANEL
BUN: 12 mg/dL (ref 6–23)
Calcium: 9.3 mg/dL (ref 8.4–10.5)
GFR calc non Af Amer: >60 mL/min (ref >60)
Glucose, Bld: 88 mg/dL (ref 70–99)
Sodium: 141 mEq/L (ref 135–145)

## 2011-01-13 LAB — CBC
HCT: 40.2 % (ref 36.0–46.0)
MCHC: 33.3 g/dL (ref 30.0–36.0)
RDW: 13.2 % (ref 11.5–15.5)

## 2011-01-14 ENCOUNTER — Ambulatory Visit (HOSPITAL_COMMUNITY)
Admission: RE | Admit: 2011-01-14 | Discharge: 2011-01-17 | Disposition: A | Discharge status: Home / Self Care | Payer: Medicaid Other | Source: Ambulatory Visit | Attending: Plastic Surgery | Admitting: Plastic Surgery

## 2011-01-14 ENCOUNTER — Other Ambulatory Visit: Payer: Self-pay | Admitting: Plastic Surgery

## 2011-01-14 DIAGNOSIS — Z9010 Acquired absence of unspecified breast and nipple: Secondary | ICD-10-CM | POA: Insufficient documentation

## 2011-01-14 DIAGNOSIS — Z853 Personal history of malignant neoplasm of breast: Secondary | ICD-10-CM | POA: Insufficient documentation

## 2011-01-14 DIAGNOSIS — Z421 Encounter for breast reconstruction following mastectomy: Principal | ICD-10-CM | POA: Insufficient documentation

## 2011-01-14 DIAGNOSIS — Z87891 Personal history of nicotine dependence: Secondary | ICD-10-CM | POA: Insufficient documentation

## 2011-01-14 LAB — DIFFERENTIAL
Basophils Relative: 0 % (ref 0–1)
Eosinophils Absolute: 0 10*3/uL (ref 0.0–0.7)
Eosinophils Relative: 0 % (ref 0–5)
Monocytes Absolute: 0.8 10*3/uL (ref 0.1–1.0)
Monocytes Relative: 7 % (ref 3–12)
Neutrophils Relative %: 85 % — ABNORMAL HIGH (ref 43–77)

## 2011-01-14 LAB — CBC
MCH: 33.3 pg (ref 26.0–34.0)
MCHC: 34.2 g/dL (ref 30.0–36.0)
Platelets: 130 10*3/uL — ABNORMAL LOW (ref 150–400)
RDW: 13.2 % (ref 11.5–15.5)

## 2011-01-14 LAB — BASIC METABOLIC PANEL
Calcium: 7.7 mg/dL — ABNORMAL LOW (ref 8.4–10.5)
Creatinine, Ser: 0.48 mg/dL (ref 0.4–1.2)
GFR calc Af Amer: >60 mL/min (ref >60)

## 2011-01-15 HISTORY — PX: BREAST RECONSTRUCTION: SHX9

## 2011-01-15 LAB — BASIC METABOLIC PANEL
Chloride: 101 mEq/L (ref 96–112)
Creatinine, Ser: 0.52 mg/dL (ref 0.4–1.2)
GFR calc Af Amer: >60 mL/min (ref >60)
GFR calc non Af Amer: >60 mL/min (ref >60)
Potassium: 3.6 mEq/L (ref 3.5–5.1)

## 2011-01-15 LAB — CBC
Platelets: 135 10*3/uL — ABNORMAL LOW (ref 150–400)
RBC: 3.4 MIL/uL — ABNORMAL LOW (ref 3.87–5.11)
RDW: 13.5 % (ref 11.5–15.5)
WBC: 10.5 10*3/uL (ref 4.0–10.5)

## 2011-01-18 NOTE — Discharge Summary (Signed)
  NAMEVASSIE, KUGEL                ACCOUNT NO.:  [id number]  MEDICAL RECORD NO.:  [id number]  LOCATION:  5124                         FACILITY:  [facility]  PHYSICIAN:  Wayland Denis, DO      DATE OF BIRTH:  1970/09/22  DATE OF ADMISSION:  01/14/2011 DATE OF DISCHARGE:  01/17/2011                              DISCHARGE SUMMARY   ADMITTING DIAGNOSIS:  History of left-sided breast cancer.  PROCEDURE:  Left breast reconstruction.  BRIEF HISTORY:  [name] is a 40 year old female who was diagnosed with left breast cancer and underwent mastectomy with radiation.  She presents for reconstruction.  She has been seen in the office with some complications for the procedure were explained and she wished to proceed with reconstruction using a latissimus reconstruction flap with expander placement underneath.  HOSPITAL COURSE:  The patient was taken to the operating room and underwent a latissimus muscle flap with expander placement for the left breast reconstruction.  She did well throughout the procedure. Postoperatively, she was managed on the floor.  Initially, her pain was managed with PCA, which was switched to oral medications on postop day 2.  She was continued on antibiotics IV.  Her diet was advanced to a regular diet.  Compression hose were placed on the lower extremities for DVT prophylaxis.  On postop day 2, she was given Lovenox subcu as well. Incentive spirometer was used to help decreased pulmonary atelectasis. Colace was given prophylactically for decreasing the incidence of constipation. By discharge, she was ambulating, tolerating regular foods.  Her pain was managed with oral medication.  She was eager to go home.  She was caring for herself, ambulating without assistance.  Drain care was explained.  She will have a followup appointment in the clinic 2 days after discharge.  Her medications were called into the pharmacy prior to her procedure.  HOME MEDICATIONS FROM THE  PROCEDURE: 1. Vicodin 5/325 one p.o. q.6 h p.r.n. pain. 2. Keflex 500 mg one p.o. q.6 hours. 3. Colace 100 mg one p.o. q.8 hours. 4. Zofran 4 mg one p.o. q.6 h p.r.n. nausea and vomiting. 5. Phenergan 25 mg one p.o. q. 6 hours p.r.n. nausea and vomiting. 6. Aleve 220 mg one p.o. q.8 h. 7. Valium 2 mg 1 p.o. q.12 hours p.r.n. muscle tightness.  At the time of discharge, her vitals were stable.  She was afebrile and 100% on room air oxygen.     Wayland Denis, DO     CS/MEDQ  D:  01/17/2011  T:  01/18/2011  Job:  [id number]  Electronically Signed by Wayland Denis  on 01/18/2011 10:15:52 AM

## 2011-02-09 NOTE — Op Note (Signed)
Renee [name], Renee [name]                ACCOUNT NO.:  [id number]  MEDICAL RECORD NO.:  [id number]  LOCATION:  5124                         FACILITY:  [facility]  PHYSICIAN:  Wayland Denis, DO      DATE OF BIRTH:  05/05/1971  DATE OF PROCEDURE: DATE OF DISCHARGE:                              OPERATIVE REPORT   PREOPERATIVE DIAGNOSIS:  Left breast cancer.  POSTOPERATIVE DIAGNOSIS:  Left breast cancer.  PROCEDURE:  Delayed reconstruction with left latissimus dorsi muscle reconstruction, muscle flap reconstruction with expander placement.  ATTENDING:  Wayland Denis, DO  ANESTHESIA:  General.  INDICATIONS FOR PROCEDURE:  [name] is a 40 year old female who had left breast cancer and underwent radiation for her treatment.  She is now ready for reconstruction.  DESCRIPTION OF PROCEDURE:  The patient was seen in the holding room. Risks and complications were explained.  Consent was confirmed.  She was taken to the operating room and placed on the operating room table in supine position.  General anesthesia was administered once adequate time- out was called.  All information was confirmed to be correct by those in the room.  She was prepped and draped in the usual sterile fashion.  The patient was marked preoperatively.  A #10 blade was used to excise the previous incision site that had scarred down over the left mastectomy site.  The Bovie was then used to dissect down to the pectoralis major muscle.  The skin hooks were used to lift the skin and create a pocket over the pectoralis major muscle.  Hemostasis was achieved with the electrocautery.  Once the pocket was created to the inframammary fold, margin of the sternum and inferior to the clavicle and midaxillary line. Once that pocket was created, the pectoralis was lifted with the Bovie to create a subpectoral pocket.  The pectoralis was released laterally and pocket was created for the expander.  Once that was complete, a  wet sponge was placed in an [place] over the skin in order to secure the area. The patient was then repositioned to the right lateral position.  All bony points were padded.  The patient was secured.  Foley was placed just prior to repositioning.  The patient was prepped and draped in the usual sterile fashion.  A #10 blade was used to make an incision at the previous marked latissimus site.  The Bovie was used to dissect away from the skin paddle and down to the latissimus muscle.  The borders of the latissimus were identified with the electrocautery.  Particular care was taken not to reach the scapular fat pad.  Once the borders were identified, the latissimus was released inferiorly and then released up to the insertion point and with the blood supply kept intact, the paddle was checked and there was excellent blood supply.  The sponge from the pectoral pocket was removed and the latissimus was dropped into the pocket.  Hemostasis was achieved with electrocautery.  Two drains were placed, one that was more posterior was inferior and the one that was more inferior was superior. Tisseel was used to help decrease the treatment used to close the incision site deep layers and then 4-0 Vicryl  for the continuous closure and 5-0 Monocryl for the skin.  Dermabond was applied and a sterile dressing.  The patient's drains were secured with 3-0 silk.  The patient was then repositioned and redraped and prepped in the supine position. The [place] was removed and the latissimus was tacked on the lateral edges of the latissimus to create a pocket over the pectoralis.  The expander was placed under the pectoralis, 30 mL of injectable saline was injected into the expander.  The air was evacuated prior to injecting any saline into the expander.  A 3-0 Vicryl was used to tack the latissimus and also to close the skin edges to the skin paddle.  A 4-0 Vicryl was then used and 5-0 Monocryl.  Prior to closure,  the drain was placed and secured with 3-0 silk.  Dermabond was applied to the skin, an ABD, and a breast binder.  The patient tolerated the procedure well with no complications.  She was awoken and taken to recovery room in stable condition.  Foley was removed prior to deporting the room.  The excised left breast skin scar was sent to Pathology for evaluation because of her cancer history.     Wayland Denis, DO     CS/MEDQ  D:  01/14/2011  T:  01/15/2011  Job:  [id number]  Electronically Signed by Wayland Denis  on 02/09/2011 07:51:47 AM

## 2011-03-04 ENCOUNTER — Ambulatory Visit (HOSPITAL_BASED_OUTPATIENT_CLINIC_OR_DEPARTMENT_OTHER)
Admission: RE | Admit: 2011-03-04 | Discharge: 2011-03-04 | Disposition: A | Discharge status: Home / Self Care | Payer: Medicaid Other | Source: Ambulatory Visit | Attending: Plastic Surgery | Admitting: Plastic Surgery

## 2011-03-04 DIAGNOSIS — T8130XA Disruption of wound, unspecified, initial encounter: Secondary | ICD-10-CM | POA: Insufficient documentation

## 2011-03-04 DIAGNOSIS — Y838 Other surgical procedures as the cause of abnormal reaction of the patient, or of later complication, without mention of misadventure at the time of the procedure: Secondary | ICD-10-CM | POA: Insufficient documentation

## 2011-03-04 DIAGNOSIS — Z01812 Encounter for preprocedural laboratory examination: Secondary | ICD-10-CM | POA: Insufficient documentation

## 2011-03-04 LAB — POCT HEMOGLOBIN-HEMACUE: Hemoglobin: 13.5 g/dL (ref 12.0–15.0)

## 2011-03-05 HISTORY — PX: MINOR IRRIGATION AND DEBRIDEMENT OF WOUND: SHX6239

## 2011-03-06 LAB — WOUND CULTURE
Culture: NO GROWTH
Gram Stain: NONE SEEN

## 2011-03-08 NOTE — Op Note (Signed)
  [name], Renee [name]                ACCOUNT NO.:  [id number]  MEDICAL RECORD NO.:  [id number]  LOCATION:                                 FACILITY:  PHYSICIAN:  Wayland Denis, DO      DATE OF BIRTH:  11/09/1970  DATE OF PROCEDURE: DATE OF DISCHARGE:                              OPERATIVE REPORT   PREOPERATIVE DIAGNOSIS:  History of left breast cancer with reconstruction, back wound ulcer due to dehiscence, breast wound.  POSTOPERATIVE DIAGNOSIS:  History of left breast cancer with reconstruction, back wound ulcer due to dehiscence, breast wound.  PROCEDURES:  Irrigation and debridement of skin, subcutaneous tissue, and capsule the left back wound with primary closure 5cm Irrigation and debridement of breast wound, skin, and subcutaneous tissue 3cm.  ATTENDING:  Wayland Denis, DO  ANESTHESIA:  General.  INDICATIONS FOR PROCEDURE:  The patient is a 40 year old female who underwent reconstruction.  Unfortunately, she started smoking and dehisced her back incision and a portion of her medial breast reconstruction.  Decision was made to bring her to the OR.  She was seen in the holding room.  Risks and complications were reviewed.  DESCRIPTION OF PROCEDURE:  The patient was taken to the operating room placed on the operating room table in supine position.  General anesthesia was administered once adequate time-out was called and all information was confirmed to be correct.  She was then placed in the right lateral position.  She was prepped and draped in the usual sterile fashion.  A -15 blade was used to make an incision along where the opening was and it was debrided completely with sharp dissection using a #15 blade.  There was a capsule that was approximately 4 x 10 cm and this was excised from the flap side.  The chest side was left intact. Hemostasis was achieved using electrocautery.  Cultures were sent. Copious amounts of irrigation was used to irrigate the wound.  The  deep layers were closed with 3-0 Vicryl.  A drain was placed prior to closure.  A #10 drain was secured to the skin with a 3-0 silk to seal and was used to help decrease the seroma formation.  The next layer was closed with 4-0 Vicryl and 5-0 Monocryl to close the skin.  The patient then had Dermabond applied and an ABD.  She was placed in the supine position.  Attention was given to the anterior wound.  It was prepped and draped in usual sterile fashion.  A #15 blade was used to excise the wound.  The flap was brought more medially.  1 cm of undermining was done to decrease the tension. A 5-0 Vicryl was used to reapproximate the deep layers and 5-0 Monocryl was used to close the skin.  Dermabond was applied, 4 x 4's, and breast binder.  The patient tolerated the procedure well.  No complications.  She was awoken and taken to the recovery room in stable condition.     Wayland Denis, DO     CS/MEDQ  D:  03/04/2011  T:  03/05/2011  Job:  [id number]  Electronically Signed by Wayland Denis  on 03/08/2011 08:30:08 AM

## 2011-03-10 LAB — ANAEROBIC CULTURE

## 2011-03-18 ENCOUNTER — Other Ambulatory Visit: Payer: Self-pay | Admitting: Internal Medicine

## 2011-05-06 LAB — POCT I-STAT, CHEM 8
BUN: 14
Calcium, Ion: 1.35 — ABNORMAL HIGH
HCT: 36
Hemoglobin: 12.2
Sodium: 139
TCO2: 29

## 2011-05-06 LAB — POCT CARDIAC MARKERS
Myoglobin, poc: 64.1
Operator id: 151321

## 2011-05-06 LAB — D-DIMER, QUANTITATIVE: D-Dimer, Quant: 0.28

## 2011-05-07 DIAGNOSIS — C50919 Malignant neoplasm of unspecified site of unspecified female breast: Secondary | ICD-10-CM | POA: Insufficient documentation

## 2011-05-07 DIAGNOSIS — Z9889 Other specified postprocedural states: Secondary | ICD-10-CM | POA: Insufficient documentation

## 2011-06-09 ENCOUNTER — Encounter: Payer: Self-pay | Admitting: *Deleted

## 2011-06-09 ENCOUNTER — Emergency Department (HOSPITAL_COMMUNITY): Payer: Medicaid Other

## 2011-06-09 ENCOUNTER — Emergency Department (HOSPITAL_COMMUNITY)
Admission: EM | Admit: 2011-06-09 | Discharge: 2011-06-09 | Disposition: A | Discharge status: Home / Self Care | Payer: Medicaid Other | Attending: Emergency Medicine | Admitting: Emergency Medicine

## 2011-06-09 DIAGNOSIS — N200 Calculus of kidney: Secondary | ICD-10-CM | POA: Insufficient documentation

## 2011-06-09 DIAGNOSIS — N390 Urinary tract infection, site not specified: Secondary | ICD-10-CM | POA: Insufficient documentation

## 2011-06-09 DIAGNOSIS — Z9010 Acquired absence of unspecified breast and nipple: Secondary | ICD-10-CM | POA: Insufficient documentation

## 2011-06-09 DIAGNOSIS — R109 Unspecified abdominal pain: Secondary | ICD-10-CM | POA: Insufficient documentation

## 2011-06-09 DIAGNOSIS — Z853 Personal history of malignant neoplasm of breast: Secondary | ICD-10-CM | POA: Insufficient documentation

## 2011-06-09 HISTORY — DX: Malignant neoplasm of unspecified site of unspecified female breast: C50.919

## 2011-06-09 LAB — URINALYSIS, ROUTINE W REFLEX MICROSCOPIC
Glucose, UA: NEGATIVE mg/dL
Ketones, ur: NEGATIVE mg/dL
Nitrite: NEGATIVE
Protein, ur: NEGATIVE mg/dL
Urobilinogen, UA: 0.2 mg/dL (ref 0.0–1.0)

## 2011-06-09 LAB — COMPREHENSIVE METABOLIC PANEL
Albumin: 3.6 g/dL (ref 3.5–5.2)
Alkaline Phosphatase: 83 U/L (ref 39–117)
BUN: 11 mg/dL (ref 6–23)
Chloride: 99 mEq/L (ref 96–112)
Creatinine, Ser: 0.7 mg/dL (ref 0.50–1.10)
GFR calc Af Amer: >90 mL/min (ref >90)
Glucose, Bld: 104 mg/dL — ABNORMAL HIGH (ref 70–99)
Potassium: 3.8 mEq/L (ref 3.5–5.1)
Total Bilirubin: 0.3 mg/dL (ref 0.3–1.2)

## 2011-06-09 LAB — DIFFERENTIAL
Basophils Relative: 0 % (ref 0–1)
Eosinophils Absolute: 0.1 10*3/uL (ref 0.0–0.7)
Lymphs Abs: 1.8 10*3/uL (ref 0.7–4.0)
Monocytes Absolute: 1.2 10*3/uL — ABNORMAL HIGH (ref 0.1–1.0)
Monocytes Relative: 9 % (ref 3–12)
Neutro Abs: 9.5 10*3/uL — ABNORMAL HIGH (ref 1.7–7.7)

## 2011-06-09 LAB — CBC
HCT: 37.5 % (ref 36.0–46.0)
Hemoglobin: 12.7 g/dL (ref 12.0–15.0)
MCH: 33.1 pg (ref 26.0–34.0)
MCHC: 33.9 g/dL (ref 30.0–36.0)
RBC: 3.84 MIL/uL — ABNORMAL LOW (ref 3.87–5.11)

## 2011-06-09 LAB — URINE MICROSCOPIC-ADD ON

## 2011-06-09 MED ORDER — NITROFURANTOIN MONOHYD MACRO 100 MG PO CAPS: 100.0000 mg | ORAL_CAPSULE | Freq: Two times a day (BID) | ORAL | Status: AC

## 2011-06-09 MED ORDER — KETOROLAC TROMETHAMINE 30 MG/ML IJ SOLN
30.0000 mg | Freq: Once | INTRAMUSCULAR | Status: AC
  Administered 2011-06-09: 30 mg via INTRAVENOUS
  Filled 2011-06-09: qty 1

## 2011-06-09 MED ORDER — SODIUM CHLORIDE 0.9 % IV BOLUS (SEPSIS)
1000.0000 mL | Freq: Once | INTRAVENOUS | Status: AC
  Administered 2011-06-09: 1000 mL via INTRAVENOUS

## 2011-06-09 MED ORDER — SODIUM CHLORIDE 0.9 % IV SOLN: INTRAVENOUS | Status: DC

## 2011-06-09 MED ORDER — HYDROMORPHONE HCL 1 MG/ML IJ SOLN
1.0000 mg | Freq: Once | INTRAMUSCULAR | Status: AC
  Administered 2011-06-09: 1 mg via INTRAVENOUS
  Filled 2011-06-09: qty 1

## 2011-06-09 MED ORDER — ONDANSETRON HCL 4 MG PO TABS: 4.0000 mg | ORAL_TABLET | Freq: Four times a day (QID) | ORAL | Status: AC

## 2011-06-09 MED ORDER — NITROFURANTOIN MONOHYD MACRO 100 MG PO CAPS
ORAL_CAPSULE | ORAL | Status: AC
  Filled 2011-06-09: qty 1

## 2011-06-09 MED ORDER — NITROFURANTOIN MONOHYD MACRO 100 MG PO CAPS
100.0000 mg | ORAL_CAPSULE | Freq: Once | ORAL | Status: AC
  Administered 2011-06-09: 100 mg via ORAL
  Filled 2011-06-09: qty 1

## 2011-06-09 MED ORDER — OXYCODONE-ACETAMINOPHEN 5-325 MG PO TABS: 2.0000 | ORAL_TABLET | ORAL | Status: AC | PRN

## 2011-06-09 MED ORDER — ONDANSETRON HCL 4 MG/2ML IJ SOLN
4.0000 mg | Freq: Once | INTRAMUSCULAR | Status: AC
  Administered 2011-06-09: 4 mg via INTRAVENOUS
  Filled 2011-06-09: qty 2

## 2011-06-09 NOTE — ED Notes (Signed)
Pt reports right flank pain.  Started about 2 weeks ago, progressively worsening.  Reports nausea and vomiting. Denies problem with urination.

## 2011-06-09 NOTE — ED Notes (Signed)
Pt still reporting nausea but does not want more medication. Requesting something to drink.

## 2011-06-09 NOTE — ED Provider Notes (Signed)
Scribed for Donnetta Hutching, MD, the patient was seen in room APA01/APA01. This chart was scribed by [employer]. The patient's care started at 21:19  CSN: [id number] Arrival date & time: 06/09/2011  9:00 PM   First MD Initiated Contact with Patient 06/09/11 2119      Chief Complaint  Patient presents with  . Flank Pain   HPI Renee [name] is a 40 y.o. female with a history of kidney stones who presents to the Emergency Department complaining of flank pain for about two weeks. Reports that pain radiates from the right flank to the RLQ of the abdomen. Denies dysuria or hematuria but reports associated nausea and vomiting. Reports mild 99 fever. PCP is Dr Amador Cunas. Patient reports a history of Kidney stones.  Past Medical History  Diagnosis Date  . Breast cancer     Past Surgical History  Procedure Date  . Mastectomy     History reviewed. No pertinent family history.  History  Substance Use Topics  . Smoking status: Never Smoker   . Smokeless tobacco: Not on file  . Alcohol Use: No    OB History    Grav Para Term Preterm Abortions TAB SAB Ect Mult Living                  Review of Systems  Gastrointestinal: Positive for abdominal pain.  Genitourinary: Positive for flank pain (right).  All other systems reviewed and are negative.    Allergies  Review of patient's allergies indicates no known allergies.  Home Medications   Current Outpatient Rx  Name Route Sig Dispense Refill  . TAMOXIFEN CITRATE 10 MG PO TABS Oral Take 10 mg by mouth 2 (two) times daily.      Marland Kitchen SYNTHROID 112 MCG PO TABS  TAKE ONE TABLET BY MOUTH EVERY DAY 90 each 0    Dispense as written.    BP 120/67  Pulse 104  Temp(Src) 99.9 F (37.7 C) (Oral)  Resp 21  Ht 5' (1.524 m)  Wt 142 lb (64.411 kg)  BMI 27.73 kg/m2  SpO2 99%  Physical Exam  Nursing note and vitals reviewed. Constitutional: She is oriented to person, place, and time.       Awake, alert, nontoxic appearance with  baseline speech.  HENT:  Head: Atraumatic.  Eyes: Pupils are equal, round, and reactive to light. Right eye exhibits no discharge. Left eye exhibits no discharge.  Neck: Neck supple.  Cardiovascular: Normal rate and regular rhythm.   No murmur heard. Pulmonary/Chest: Effort normal and breath sounds normal. No respiratory distress. She has no wheezes. She has no rales. She exhibits no tenderness.  Abdominal: Soft. Bowel sounds are normal. She exhibits no mass. There is no tenderness. There is no rebound.  Musculoskeletal: She exhibits tenderness (right flank to the RLQ).       Thoracic back: She exhibits no tenderness.       Lumbar back: She exhibits no tenderness.  Neurological: She is alert and oriented to person, place, and time. No cranial nerve deficit.       Mental status baseline for patient.  Upper extremity motor strength and sensation intact and symmetric bilaterally.  Skin: No rash noted.  Psychiatric: She has a normal mood and affect.    ED Course  Procedures   DIAGNOSTIC STUDIES: Oxygen Saturation is 99% on room air, normal by my interpretation.    COORDINATION OF CARE: 21:25 - EDP examined patient at bedside. Pain management, IV Fluids, blood work, UA  and CT abdomen orderd  Results for orders placed during the hospital encounter of 06/09/11  CBC      Component Value Range   WBC 12.5 (*) 4.0 - 10.5 (K/uL)   RBC 3.84 (*) 3.87 - 5.11 (MIL/uL)   Hemoglobin 12.7  12.0 - 15.0 (g/dL)   HCT 21.3  08.6 - 57.8 (%)   MCV 97.7  78.0 - 100.0 (fL)   MCH 33.1  26.0 - 34.0 (pg)   MCHC 33.9  30.0 - 36.0 (g/dL)   RDW 46.9  62.9 - 52.8 (%)   Platelets 181  150 - 400 (K/uL)  DIFFERENTIAL      Component Value Range   Neutrophils Relative 75  43 - 77 (%)   Neutro Abs 9.5 (*) 1.7 - 7.7 (K/uL)   Lymphocytes Relative 15  12 - 46 (%)   Lymphs Abs 1.8  0.7 - 4.0 (K/uL)   Monocytes Relative 9  3 - 12 (%)   Monocytes Absolute 1.2 (*) 0.1 - 1.0 (K/uL)   Eosinophils Relative 1  0 - 5 (%)    Eosinophils Absolute 0.1  0.0 - 0.7 (K/uL)   Basophils Relative 0  0 - 1 (%)   Basophils Absolute 0.0  0.0 - 0.1 (K/uL)  COMPREHENSIVE METABOLIC PANEL      Component Value Range   Sodium 135  135 - 145 (mEq/L)   Potassium 3.8  3.5 - 5.1 (mEq/L)   Chloride 99  96 - 112 (mEq/L)   CO2 27  19 - 32 (mEq/L)   Glucose, Bld 104 (*) 70 - 99 (mg/dL)   BUN 11  6 - 23 (mg/dL)   Creatinine, Ser 4.13  0.50 - 1.10 (mg/dL)   Calcium 9.4  8.4 - 24.4 (mg/dL)   Total Protein 7.3  6.0 - 8.3 (g/dL)   Albumin 3.6  3.5 - 5.2 (g/dL)   AST 14  0 - 37 (U/L)   ALT 10  0 - 35 (U/L)   Alkaline Phosphatase 83  39 - 117 (U/L)   Total Bilirubin 0.3  0.3 - 1.2 (mg/dL)   GFR calc non Af Amer >90  >90 (mL/min)   GFR calc Af Amer >90  >90 (mL/min)  URINALYSIS, ROUTINE W REFLEX MICROSCOPIC      Component Value Range   Color, Urine YELLOW  YELLOW    Appearance CLEAR  CLEAR    Specific Gravity, Urine 1.020  1.005 - 1.030    pH 6.0  5.0 - 8.0    Glucose, UA NEGATIVE  NEGATIVE (mg/dL)   Hgb urine dipstick NEGATIVE  NEGATIVE    Bilirubin Urine NEGATIVE  NEGATIVE    Ketones, ur NEGATIVE  NEGATIVE (mg/dL)   Protein, ur NEGATIVE  NEGATIVE (mg/dL)   Urobilinogen, UA 0.2  0.0 - 1.0 (mg/dL)   Nitrite NEGATIVE  NEGATIVE    Leukocytes, UA SMALL (*) NEGATIVE   URINE MICROSCOPIC-ADD ON      Component Value Range   Squamous Epithelial / LPF FEW (*) RARE    WBC, UA 11-20  <3 (WBC/hpf)   Bacteria, UA FEW (*) RARE       MDM  Patient complains of right flank pain with radiation to the right lower quadrant. CT scan shows negative acute;  however 5 mm stone noted in parenchyma. Will treat urinary infection, meds for pain and nausea. Follow up with urologist.    I personally performed the services described in this documentation, which was scribed in my presence. The recorded  information has been reviewed and considered.   Donnetta Hutching, MD 06/09/11 [phone]

## 2011-06-12 LAB — URINE CULTURE: Colony Count: >=100000

## 2011-06-13 NOTE — ED Notes (Addendum)
+   urine culture. Chart sent to EDP office for review patient put on Zithromax  by PCP

## 2011-06-15 ENCOUNTER — Encounter (HOSPITAL_COMMUNITY): Payer: Medicaid Other | Attending: Oncology | Admitting: Oncology

## 2011-06-15 ENCOUNTER — Encounter (HOSPITAL_COMMUNITY): Payer: Self-pay | Admitting: Oncology

## 2011-06-15 DIAGNOSIS — C50419 Malignant neoplasm of upper-outer quadrant of unspecified female breast: Secondary | ICD-10-CM

## 2011-06-15 DIAGNOSIS — C50919 Malignant neoplasm of unspecified site of unspecified female breast: Secondary | ICD-10-CM

## 2011-06-15 DIAGNOSIS — E079 Disorder of thyroid, unspecified: Secondary | ICD-10-CM | POA: Insufficient documentation

## 2011-06-15 NOTE — Patient Instructions (Signed)
[facility]  Discharge Instructions  RECOMMENDATIONS MADE BY THE CONSULTANT AND ANY TEST RESULTS WILL BE SENT TO YOUR REFERRING DOCTOR.   EXAM FINDINGS BY MD TODAY AND SIGNS AND SYMPTOMS TO REPORT TO CLINIC OR PRIMARY MD: you are doing well.  Report any lumps, bone pain or shortness of breath.  MEDICATIONS PRESCRIBED: none     SPECIAL INSTRUCTIONS/FOLLOW-UP: Return to Clinic in 6 months.   I acknowledge that I have been informed and understand all the instructions given to me and received a copy. I do not have any more questions at this time, but understand that I may call the Specialty Clinic at [facility] at [phone] during business hours should I have any further questions or need assistance in obtaining follow-up care.    __________________________________________  _____________  __________ Signature of Patient or Authorized Representative            Date                   Time    __________________________________________ Nurse's Signature

## 2011-06-15 NOTE — Progress Notes (Signed)
Renee Boga, MD [address] [place] [address]  1. Breast cancer  CBC, Differential, Comprehensive metabolic panel  2. Thyroid disease    3. 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  S/P radiation following chemo by Dr. Mitzi Hansen.   Now on Tamoxifen.  INTERVAL HISTORY: Renee [name] 40 y.o. 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  Both mets were > 3 mm and positive extracapsular extension.  No LVI.  Er 96%, PR 11%, Ki-67 20%, Her2 negative.  The patient denies any complaints except weight gain.  She reports that she is exercising (walking), but may not be eating the healthiest of foods.    The patient did successfully complete a skydiving trip.  She reports that it was exhilarating.    She is continuing to undergo the breast reconstruction procedure under the care of Dr. Wayland Denis.   She reports that the procedure is going well.  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  I wished her good luck with that venture.  The patient was recently in the ER for right sided flank pain.  She was diagnosed with a UTI for which she is taking an antibiotic.  She has a few more doses left.  She was also diagnosed with a renal calculus measuring 5 mm.   Past Medical History  Diagnosis Date  . Breast cancer     chemo  . 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      has no known allergies.  [name] does not currently have medications on file.  Past Surgical History  Procedure Date  . Mastectomy   . Portacath placement   . Port-a-cath removal   .  Breast reconstruction 01/2011    Denies any headaches, dizziness, double vision, fevers, chills, night sweats, nausea, vomiting, diarrhea, constipation, chest pain, heart palpitations, shortness of breath, blood in stool, black tarry stool, urinary pain, urinary burning, urinary frequency, hematuria.   PHYSICAL EXAMINATION  ECOG PERFORMANCE STATUS: 0 - Asymptomatic  Filed Vitals:   06/15/11 1525  BP: 111/69  Pulse: 66  Temp: 98.1 F (36.7 C)    GENERAL:alert, no distress, well nourished, well developed, comfortable, cooperative and smiling SKIN: skin color, texture, turgor are normal HEAD: Normocephalic EYES: normal EARS: External ears normal OROPHARYNX:mucous membranes are moist  NECK: supple, no adenopathy, no bruits, thyroid normal size, non-tender, without nodularity, no stridor, non-tender, trachea midline LYMPH:  no palpable lymphadenopathy, no hepatosplenomegaly BREAST:right breast normal without mass, skin or nipple changes or axillary nodes,Left breast is undergoing reconstruction, but no palpable abnormalities otherwise LUNGS: clear to auscultation and percussion HEART: regular rate & rhythm, no murmurs, no gallops, S1 normal and S2 normal ABDOMEN:abdomen soft, non-tender, normal bowel sounds and no hepatosplenomegaly BACK: Back symmetric, no curvature., No CVA tenderness, left sided scar well healed from breast reconstruction EXTREMITIES:less then 2 second capillary refill, no joint deformities, effusion, or inflammation, no edema, no skin discoloration, no clubbing, no cyanosis  NEURO: alert & oriented x 3 with fluent speech, no focal motor/sensory deficits, gait normal  LABORATORY DATA: CBC    Component Value Date/Time   WBC  12.5* 06/09/2011 2130   RBC 3.84* 06/09/2011 2130   HGB 12.7 06/09/2011 2130   HCT 37.5 06/09/2011 2130   PLT 181 06/09/2011 2130   MCV 97.7 06/09/2011 2130   MCH 33.1 06/09/2011 2130   MCHC 33.9 06/09/2011 2130   RDW 13.2 06/09/2011 2130     LYMPHSABS 1.8 06/09/2011 2130   MONOABS 1.2* 06/09/2011 2130   EOSABS 0.1 06/09/2011 2130   BASOSABS 0.0 06/09/2011 2130      Chemistry      Component Value Date/Time   NA 135 06/09/2011 2130   K 3.8 06/09/2011 2130   CL 99 06/09/2011 2130   CO2 27 06/09/2011 2130   BUN 11 06/09/2011 2130   CREATININE 0.70 06/09/2011 2130      Component Value Date/Time   CALCIUM 9.4 06/09/2011 2130   ALKPHOS 83 06/09/2011 2130   AST 14 06/09/2011 2130   ALT 10 06/09/2011 2130   BILITOT 0.3 06/09/2011 2130       RADIOGRAPHIC STUDIES:  06/09/2011  *RADIOLOGY REPORT*  Clinical Data: Right flank pain. Previous history of renal stones.  CT ABDOMEN AND PELVIS WITHOUT CONTRAST  Technique: Multidetector CT imaging of the abdomen and pelvis was  performed following the standard protocol without intravenous  contrast.  Comparison: 11/07/2009  Findings: Mild dependent atelectasis in the lung bases. 5 mm  nonobstructing stone in the midportion of the right kidney. No  other renal or ureteral or bladder stones demonstrated. No  pyelocaliectasis or ureterectasis. The bladder is decompressed.  The unenhanced liver, spleen, gallbladder, pancreas, adrenal  glands, and retroperitoneal lymph nodes are unremarkable.  Calcification of the aorta. The stomach and small bowel are  decompressed. No free fluid or free air in the abdomen. Broad-  based umbilical hernia containing fat without fat necrosis. Stool  filled colon without wall thickening or distension.  Pelvis: The uterus and adnexal structures are not enlarged. The  appendix is normal. No inflammatory changes in the sigmoid colon.  Normal alignment of the lumbar vertebra.  IMPRESSION:  5 mm nonobstructing stone in the midportion of the right kidney.  No ureteral stones or obstruction is demonstrated.  Original Report Authenticated By: Marlon Pel, M.D.    ASSESSMENT:  1. Stage IIIA left sided, poorly differentiated infiltrating  ductal carcinoma of left breast S/P chemotherapy, radiation, and mastectomy.   PLAN:  1. Return in 6 months for follow-up. 2. Lab work in 6 months: CBC diff, CMET    All questions were answered. The patient knows to call the clinic with any problems, questions or concerns. We can certainly see the patient much sooner if necessary.  The patient and plan discussed with Glenford Peers, MD and he is in agreement with the aforementioned.  I spent 20 minutes counseling the patient face to face. The total time spent in the appointment was 25 minutes.  Louis Ivery

## 2011-07-03 ENCOUNTER — Other Ambulatory Visit (HOSPITAL_COMMUNITY): Payer: Self-pay | Admitting: Oncology

## 2011-07-05 ENCOUNTER — Encounter (HOSPITAL_COMMUNITY): Payer: Self-pay

## 2011-07-06 ENCOUNTER — Telehealth (HOSPITAL_COMMUNITY): Payer: Self-pay | Admitting: *Deleted

## 2011-07-06 ENCOUNTER — Other Ambulatory Visit (HOSPITAL_COMMUNITY): Payer: Self-pay | Admitting: Oncology

## 2011-07-06 NOTE — Telephone Encounter (Signed)
She should have 6 refills

## 2011-08-04 ENCOUNTER — Encounter (HOSPITAL_COMMUNITY): Payer: Self-pay

## 2011-08-06 ENCOUNTER — Other Ambulatory Visit (HOSPITAL_COMMUNITY): Payer: Self-pay | Admitting: *Deleted

## 2011-08-13 ENCOUNTER — Encounter (HOSPITAL_COMMUNITY): Payer: Self-pay

## 2011-08-13 ENCOUNTER — Encounter (HOSPITAL_COMMUNITY)
Admission: RE | Admit: 2011-08-13 | Discharge: 2011-08-13 | Disposition: A | Discharge status: Home / Self Care | Payer: Medicaid Other | Source: Ambulatory Visit | Attending: Plastic Surgery | Admitting: Plastic Surgery

## 2011-08-13 HISTORY — DX: Hypothyroidism, unspecified: E03.9

## 2011-08-13 LAB — CBC
MCV: 97.1 fL (ref 78.0–100.0)
Platelets: 203 10*3/uL (ref 150–400)
RBC: 4.11 MIL/uL (ref 3.87–5.11)
RDW: 14.3 % (ref 11.5–15.5)
WBC: 9.1 10*3/uL (ref 4.0–10.5)

## 2011-08-13 LAB — BASIC METABOLIC PANEL
Calcium: 9.7 mg/dL (ref 8.4–10.5)
GFR calc Af Amer: >90 mL/min (ref >90)
GFR calc non Af Amer: >90 mL/min (ref >90)
Glucose, Bld: 68 mg/dL — ABNORMAL LOW (ref 70–99)
Potassium: 4 mEq/L (ref 3.5–5.1)
Sodium: 137 mEq/L (ref 135–145)

## 2011-08-13 LAB — SURGICAL PCR SCREEN
MRSA, PCR: NEGATIVE
Staphylococcus aureus: NEGATIVE

## 2011-08-13 NOTE — Pre-Procedure Instructions (Signed)
20 Renee [name]  08/13/2011   Your procedure is scheduled on:  08/19/11  Report to [facility] at 11 AM.  Call this number if you have problems the morning of surgery: [phone]   Remember:   Do not eat food:After Midnight.  May have clear liquids: up to 4 Hours before arrival.  Clear liquids include soda, tea, black coffee, apple or grape juice, broth.  Take these medicines the morning of surgery with A SIP OF WATER: synthroid,tamoxifen,   Do not wear jewelry, make-up or nail polish.  Do not wear lotions, powders, or perfumes. You may wear deodorant.  Do not shave 48 hours prior to surgery.  Do not bring valuables to the hospital.  Contacts, dentures or bridgework may not be worn into surgery.  Leave suitcase in the car. After surgery it may be brought to your room.  For patients admitted to the hospital, checkout time is 11:00 AM the day of discharge.   Patients discharged the day of surgery will not be allowed to drive home.  Name and phone number of your driver: family  Special Instructions: CHG Shower Use Special Wash: 1/2 bottle night before surgery and 1/2 bottle morning of surgery.   Please read over the following fact sheets that you were given: Pain Booklet, MRSA Information and Surgical Site Infection Prevention 1

## 2011-08-18 MED ORDER — CHLORHEXIDINE GLUCONATE 4 % EX LIQD: 1.0000 "application " | Freq: Once | CUTANEOUS | Status: DC

## 2011-08-18 MED ORDER — CEFAZOLIN SODIUM 1-5 GM-% IV SOLN
1.0000 g | INTRAVENOUS | Status: AC
  Administered 2011-08-19: 1 g via INTRAVENOUS
  Filled 2011-08-18: qty 50

## 2011-08-19 ENCOUNTER — Ambulatory Visit (HOSPITAL_COMMUNITY)
Admission: RE | Admit: 2011-08-19 | Discharge: 2011-08-19 | Disposition: A | Discharge status: Home / Self Care | Payer: Medicaid Other | Source: Ambulatory Visit | Attending: Plastic Surgery | Admitting: Plastic Surgery

## 2011-08-19 ENCOUNTER — Ambulatory Visit (HOSPITAL_COMMUNITY): Payer: Medicaid Other | Admitting: Anesthesiology

## 2011-08-19 ENCOUNTER — Encounter (HOSPITAL_COMMUNITY): Payer: Self-pay | Admitting: Plastic Surgery

## 2011-08-19 ENCOUNTER — Encounter (HOSPITAL_COMMUNITY): Payer: Self-pay | Admitting: Anesthesiology

## 2011-08-19 ENCOUNTER — Encounter (HOSPITAL_COMMUNITY)
Admission: RE | Disposition: A | Discharge status: Home / Self Care | Payer: Self-pay | Source: Ambulatory Visit | Attending: Plastic Surgery

## 2011-08-19 DIAGNOSIS — Z01812 Encounter for preprocedural laboratory examination: Secondary | ICD-10-CM | POA: Insufficient documentation

## 2011-08-19 DIAGNOSIS — Z923 Personal history of irradiation: Secondary | ICD-10-CM | POA: Insufficient documentation

## 2011-08-19 DIAGNOSIS — Z4430 Encounter for fitting and adjustment of external breast prosthesis, unspecified breast: Secondary | ICD-10-CM | POA: Insufficient documentation

## 2011-08-19 DIAGNOSIS — C50919 Malignant neoplasm of unspecified site of unspecified female breast: Secondary | ICD-10-CM | POA: Insufficient documentation

## 2011-08-19 HISTORY — PX: BREAST CAPSULECTOMY WITH IMPLANT EXCHANGE: SHX5592

## 2011-08-19 HISTORY — PX: PLACEMENT OF BREAST IMPLANTS: SHX6334

## 2011-08-19 SURGERY — CAPSULECTOMY, BREAST, WITH REPLACEMENT OF IMPLANT
Anesthesia: General | Site: Breast | Laterality: Bilateral | Wound class: Clean

## 2011-08-19 MED ORDER — MEPERIDINE HCL 25 MG/ML IJ SOLN: 6.2500 mg | INTRAMUSCULAR | Status: DC | PRN

## 2011-08-19 MED ORDER — HYDROMORPHONE HCL PF 1 MG/ML IJ SOLN
INTRAMUSCULAR | Status: AC
  Filled 2011-08-19: qty 1

## 2011-08-19 MED ORDER — LACTATED RINGERS IV SOLN
INTRAVENOUS | Status: DC | PRN
  Administered 2011-08-19 (×2): via INTRAVENOUS

## 2011-08-19 MED ORDER — LACTATED RINGERS IV SOLN
INTRAVENOUS | Status: DC
  Administered 2011-08-19: 14:00:00 via INTRAVENOUS

## 2011-08-19 MED ORDER — NEOSTIGMINE METHYLSULFATE 1 MG/ML IJ SOLN
INTRAMUSCULAR | Status: DC | PRN
  Administered 2011-08-19: 4 mg via INTRAVENOUS

## 2011-08-19 MED ORDER — HYDROMORPHONE HCL PF 1 MG/ML IJ SOLN
0.2500 mg | INTRAMUSCULAR | Status: DC | PRN
  Administered 2011-08-19 (×6): 0.5 mg via INTRAVENOUS

## 2011-08-19 MED ORDER — ONDANSETRON HCL 4 MG/2ML IJ SOLN
INTRAMUSCULAR | Status: DC | PRN
  Administered 2011-08-19: 4 mg via INTRAVENOUS

## 2011-08-19 MED ORDER — PROMETHAZINE HCL 25 MG/ML IJ SOLN: 6.2500 mg | INTRAMUSCULAR | Status: DC | PRN

## 2011-08-19 MED ORDER — 0.9 % SODIUM CHLORIDE (POUR BTL) OPTIME
TOPICAL | Status: DC | PRN
  Administered 2011-08-19: 1000 mL

## 2011-08-19 MED ORDER — FENTANYL CITRATE 0.05 MG/ML IJ SOLN
INTRAMUSCULAR | Status: DC | PRN
  Administered 2011-08-19: 100 ug via INTRAVENOUS
  Administered 2011-08-19: 50 ug via INTRAVENOUS

## 2011-08-19 MED ORDER — GLYCOPYRROLATE 0.2 MG/ML IJ SOLN
INTRAMUSCULAR | Status: DC | PRN
  Administered 2011-08-19: .6 mg via INTRAVENOUS

## 2011-08-19 MED ORDER — LIDOCAINE HCL (CARDIAC) 20 MG/ML IV SOLN
INTRAVENOUS | Status: DC | PRN
  Administered 2011-08-19: 60 mg via INTRAVENOUS

## 2011-08-19 MED ORDER — SODIUM CHLORIDE 0.9 % IR SOLN
Status: DC | PRN
  Administered 2011-08-19 (×3)

## 2011-08-19 MED ORDER — ROCURONIUM BROMIDE 100 MG/10ML IV SOLN
INTRAVENOUS | Status: DC | PRN
  Administered 2011-08-19: 10 mg via INTRAVENOUS
  Administered 2011-08-19: 40 mg via INTRAVENOUS
  Administered 2011-08-19: 30 mg via INTRAVENOUS

## 2011-08-19 MED ORDER — LIDOCAINE-EPINEPHRINE (PF) 1 %-1:200000 IJ SOLN
INTRAMUSCULAR | Status: DC | PRN
  Administered 2011-08-19: 18 mL

## 2011-08-19 MED ORDER — PROPOFOL 10 MG/ML IV EMUL
INTRAVENOUS | Status: DC | PRN
  Administered 2011-08-19: 150 mg via INTRAVENOUS

## 2011-08-19 SURGICAL SUPPLY — 62 items
125CC ROUND MODERATE PROFILE SALINE BREAST IMPLANT SIZER ×1 IMPLANT
ADH SKN CLS APL DERMABOND .7 (GAUZE/BANDAGES/DRESSINGS) ×3
ATCH SMKEVC FLXB CAUT HNDSWH (FILTER) IMPLANT
BAG DECANTER FOR FLEXI CONT (MISCELLANEOUS) ×4 IMPLANT
BINDER BREAST LRG (GAUZE/BANDAGES/DRESSINGS) ×1 IMPLANT
BINDER BREAST XLRG (GAUZE/BANDAGES/DRESSINGS) IMPLANT
BIOPATCH RED 1 DISK 7.0 (GAUZE/BANDAGES/DRESSINGS) ×1 IMPLANT
CANISTER SUCTION 2500CC (MISCELLANEOUS) ×2 IMPLANT
CHLORAPREP W/TINT 26ML (MISCELLANEOUS) ×2 IMPLANT
CLOTH BEACON ORANGE TIMEOUT ST (SAFETY) ×2 IMPLANT
CONT SPEC 4OZ CLIKSEAL STRL BL (MISCELLANEOUS) ×2 IMPLANT
COVER SURGICAL LIGHT HANDLE (MISCELLANEOUS) ×2 IMPLANT
DERMABOND ADVANCED (GAUZE/BANDAGES/DRESSINGS) ×3
DERMABOND ADVANCED .7 DNX12 (GAUZE/BANDAGES/DRESSINGS) ×1 IMPLANT
DRAIN CHANNEL 19F RND (DRAIN) ×1 IMPLANT
DRAPE LAPAROSCOPIC ABDOMINAL (DRAPES) ×2 IMPLANT
DRAPE ORTHO SPLIT 77X108 STRL (DRAPES) ×4
DRAPE SURG ORHT 6 SPLT 77X108 (DRAPES) ×2 IMPLANT
DRAPE WARM FLUID 44X44 (DRAPE) ×2 IMPLANT
DRSG MEPILEX BORDER 4X4 (GAUZE/BANDAGES/DRESSINGS) ×1 IMPLANT
DRSG PAD ABDOMINAL 8X10 ST (GAUZE/BANDAGES/DRESSINGS) ×4 IMPLANT
ELECT BLADE 4.0 EZ CLEAN MEGAD (MISCELLANEOUS) ×2
ELECT REM PT RETURN 9FT ADLT (ELECTROSURGICAL) ×2
ELECTRODE BLDE 4.0 EZ CLN MEGD (MISCELLANEOUS) IMPLANT
ELECTRODE REM PT RTRN 9FT ADLT (ELECTROSURGICAL) ×1 IMPLANT
EVACUATOR SILICONE 100CC (DRAIN) ×1 IMPLANT
EVACUATOR SMOKE ACCUVAC VALLEY (FILTER) ×1
GLOVE BIO SURGEON STRL SZ 6.5 (GLOVE) ×7 IMPLANT
GLOVE BIOGEL PI IND STRL 6.5 (GLOVE) IMPLANT
GLOVE BIOGEL PI INDICATOR 6.5 (GLOVE) ×3
GLOVE SURG SS PI 6.5 STRL IVOR (GLOVE) ×4 IMPLANT
GOWN STRL NON-REIN LRG LVL3 (GOWN DISPOSABLE) ×6 IMPLANT
IMPL GEL HI PROFILE 350CC (Breast) IMPLANT
IMPLANT GEL HI PROFILE 350CC (Breast) ×2 IMPLANT
NDL HYPO 25GX1X1/2 BEV (NEEDLE) IMPLANT
NEEDLE HYPO 25GX1X1/2 BEV (NEEDLE) ×2 IMPLANT
NS IRRIG 1000ML POUR BTL (IV SOLUTION) ×4 IMPLANT
PACK GENERAL/GYN (CUSTOM PROCEDURE TRAY) ×2 IMPLANT
PAD ARMBOARD 7.5X6 YLW CONV (MISCELLANEOUS) ×2 IMPLANT
PIN SAFETY STERILE (MISCELLANEOUS) IMPLANT
PREFILTER SMOKE EVAC (FILTER) ×1 IMPLANT
SET COLLECT BLD 21X3/4 12 PB (MISCELLANEOUS) ×1 IMPLANT
SPONGE GAUZE 4X4 12PLY (GAUZE/BANDAGES/DRESSINGS) IMPLANT
STAPLER VISISTAT 35W (STAPLE) ×2 IMPLANT
SUT ETHILON 2 0 FS 18 (SUTURE) ×1 IMPLANT
SUT MON AB 5-0 P3 18 (SUTURE) ×1 IMPLANT
SUT MON AB 5-0 PS2 18 (SUTURE) ×4 IMPLANT
SUT PDS AB 2-0 CT1 27 (SUTURE) IMPLANT
SUT PDS AB 3-0 SH 27 (SUTURE) ×2 IMPLANT
SUT VIC AB 3-0 SH 27 (SUTURE) ×4
SUT VIC AB 3-0 SH 27X BRD (SUTURE) ×1 IMPLANT
SUT VIC AB 5-0 P-3 18XBRD (SUTURE) IMPLANT
SUT VIC AB 5-0 P3 18 (SUTURE) ×2
SUT VIC AB 5-0 PS2 18 (SUTURE) ×1 IMPLANT
SUT VICRYL 4-0 PS2 18IN ABS (SUTURE) ×5 IMPLANT
SYR 50ML LL SCALE MARK (SYRINGE) ×1 IMPLANT
SYR CONTROL 10ML LL (SYRINGE) ×1 IMPLANT
TOWEL OR 17X24 6PK STRL BLUE (TOWEL DISPOSABLE) ×2 IMPLANT
TOWEL OR 17X26 10 PK STRL BLUE (TOWEL DISPOSABLE) ×2 IMPLANT
TUBE CONNECTING 12X1/4 (SUCTIONS) ×1 IMPLANT
YANKAUER SUCT BULB TIP NO VENT (SUCTIONS) ×1 IMPLANT
smooth round moderate plus profile gel filled brea (Prosthesis) ×1 IMPLANT

## 2011-08-19 NOTE — Anesthesia Procedure Notes (Signed)
Procedure Name: Intubation Date/Time: 08/19/2011 2:09 PM Performed by: Ellin Goodie Pre-anesthesia Checklist: Patient identified, Emergency Drugs available, Suction available, Patient being monitored and Timeout performed Patient Re-evaluated:Patient Re-evaluated prior to inductionOxygen Delivery Method: Circle System Utilized Preoxygenation: Pre-oxygenation with 100% oxygen Intubation Type: IV induction Ventilation: Mask ventilation without difficulty Laryngoscope Size: Mac and 3 Grade View: Grade I Tube type: Oral Tube size: 7.5 mm Number of attempts: 1 Airway Equipment and Method: stylet Placement Confirmation: ETT inserted through vocal cords under direct vision,  positive ETCO2 and breath sounds checked- equal and bilateral Secured at: 21 cm Tube secured with: Tape Dental Injury: Teeth and Oropharynx as per pre-operative assessment

## 2011-08-19 NOTE — H&P (Signed)
History and Physical  Renee [name]   07/20/2011 1:00 PM Office Visit  MRN: [id number]  Description: 41 year old female  Provider: Wayland Denis, DO  Department:  Plastic Surgery   Diagnoses  -  CA - breast cancer   - Primary   174.9     Reason for Visit  -  Breast Reconstruction      Subjective:   HPI Renee [name] is a 41 years old wf here for post operative follow up from Left Breast reconstruction with latissimus and expander placement. She was diagnosed (09/23/09) with left breast invasive ductal carcinoma with DCIS and 2/3 lymph nodes positive for metastasis (pT3, pN1a). She underwent a left masectomy followed by chemo and radiation which ended 10/11. She was a 36 A bra size. The lower lateral portion of her incision opened so she was taken back to the OR for primary closure 03/04/11. She denies any fevers. All incisions are healing well. She has good cap refill and color. She was inflated to 350 cc at the last visit and likes that size.  The following portions of the patient's history were reviewed and updated as appropriate: allergies, current medications, past family history, past medical history, past social history, past surgical history and problem list.  Review of Systems  Constitutional: Negative.   HENT: Negative.   Eyes: Negative.   Respiratory: Negative.   Cardiovascular: Negative.   Gastrointestinal: Negative.   Genitourinary: Negative.   Musculoskeletal: Negative.   Neurological: Negative.   Hematological: Negative.   Psychiatric/Behavioral: Negative.   All other systems reviewed and are negative.  Objective:   Physical Exam  Constitutional: She appears well-developed and well-nourished.  HENT:   Head: Normocephalic and atraumatic.  Eyes: Conjunctivae and EOM are normal. Pupils are equal, round, and reactive to light.  Cardiovascular: Normal rate.   Pulmonary/Chest: Effort normal. No respiratory distress. She has no wheezes.  Neurological: She is alert.    Skin: Skin is warm.  Psychiatric: She has a normal mood and affect. Her behavior is normal.   Assessment:  1.  CA - breast cancer       Plan:  We placed 50 cc for a total of 400/350 cc.  We are scheduled for 08/19/11 for an implant on the right and exchange on the left.  Don't plan to go any larger than 350cc for the implant. At this point she would like to go with silicone implants.  The patient was seen in holding and marked.  The consent was signed and risks and complications were reviewed.  There is no change in her history or physical.

## 2011-08-19 NOTE — Transfer of Care (Signed)
Immediate Anesthesia Transfer of Care Note  Patient: Renee [name]  Procedure(s) Performed:  BREAST CAPSULECTOMY WITH IMPLANT EXCHANGE - EXCHANGE LEFT TISSUE EXPANDER  WITH LEFT GEL IMPLANT AND PLACE GEL IMPLANT ON RIGHT SIDE, REMOVAL OF DOG EAR LEFT FLANK (STATUS POST LATISSIMUS FLAP)  Patient Location: PACU  Anesthesia Type: General  Level of Consciousness: awake, alert  and oriented  Airway & Oxygen Therapy: Patient Spontanous Breathing and Patient connected to nasal cannula oxygen  Post-op Assessment: Report given to PACU RN and Post -op Vital signs reviewed and stable  Post vital signs: Reviewed and stable Filed Vitals:   08/19/11 1652  BP:   Pulse:   Temp: 36.7 C  Resp:     Complications: No apparent anesthesia complications

## 2011-08-19 NOTE — Brief Op Note (Addendum)
08/19/2011  4:45 PM  PATIENT:  Renee [name]  41 y.o. female  PRE-OPERATIVE DIAGNOSIS:  BREAST CANCER  POST-OPERATIVE DIAGNOSIS:  breast cancer  PROCEDURE:  Procedure(s): LEFT BREAST CAPSULECTOMY WITH EXPANDER REMOVAL AND PLACEMENT OF IMPLANT RIGHT IMPLANT PLACEMENT  SURGEON:  Surgeon(s): [employer], DO  PHYSICIAN ASSISTANT:   ASSISTANTS: none   ANESTHESIA:   local and general  EBL:  Total I/O In: 1450 [I.V.:1450] Out: 60 [Blood:60]  BLOOD ADMINISTERED:none  DRAINS: none   LOCAL MEDICATIONS USED:  LIDOCAINE 1%  SPECIMEN:  Excision of left mastectomy scar and lateral left capsule  DISPOSITION OF SPECIMEN:  PATHOLOGY  COUNTS:  YES  TOURNIQUET:  * No tourniquets in log *  DICTATION: dictated  PLAN OF CARE: Discharge to home after PACU  PATIENT DISPOSITION:  PACU - hemodynamically stable.   Delay start of Pharmacological VTE agent (>24hrs) due to surgical blood loss or risk of bleeding:  NO

## 2011-08-19 NOTE — Anesthesia Preprocedure Evaluation (Addendum)
Anesthesia Evaluation  Patient identified by MRN, date of birth, ID band Patient awake    Reviewed: Allergy & Precautions, H&P , NPO status , Patient's Chart, lab work & pertinent test results  History of Anesthesia Complications Negative for: history of anesthetic complications  Airway Mallampati: I  Neck ROM: Full    Dental  (+) Teeth Intact   Pulmonary neg pulmonary ROS,  clear to auscultation        Cardiovascular neg cardio ROS Regular Normal    Neuro/Psych Negative Neurological ROS     GI/Hepatic negative GI ROS, Neg liver ROS,   Endo/Other  Hypothyroidism Hyperthyroidism   Renal/GU negative Renal ROS     Musculoskeletal   Abdominal   Peds negative pediatric ROS (+)  Hematology negative hematology ROS (+)   Anesthesia Other Findings   Reproductive/Obstetrics                          Anesthesia Physical Anesthesia Plan  ASA: I  Anesthesia Plan: General   Post-op Pain Management:    Induction: Intravenous  Airway Management Planned: Oral ETT  Additional Equipment:   Intra-op Plan:   Post-operative Plan: Extubation in OR  Informed Consent: I have reviewed the patients History and Physical, chart, labs and discussed the procedure including the risks, benefits and alternatives for the proposed anesthesia with the patient or authorized representative who has indicated his/her understanding and acceptance.   Dental advisory given  Plan Discussed with: CRNA and Surgeon  Anesthesia Plan Comments:         Anesthesia Quick Evaluation

## 2011-08-19 NOTE — Preoperative (Signed)
Beta Blockers   Reason not to administer Beta Blockers:Not Applicable 

## 2011-08-19 NOTE — Anesthesia Postprocedure Evaluation (Signed)
  Anesthesia Post-op Note  Patient: Renee [name]  Procedure(s) Performed:  BREAST CAPSULECTOMY WITH IMPLANT EXCHANGE - EXCHANGE LEFT TISSUE EXPANDER  WITH LEFT GEL IMPLANT AND PLACE GEL IMPLANT ON RIGHT SIDE, REMOVAL OF DOG EAR LEFT FLANK (STATUS POST LATISSIMUS FLAP)  Patient Location: PACU  Anesthesia Type: General  Level of Consciousness: awake  Airway and Oxygen Therapy: Patient Spontanous Breathing  Post-op Pain: mild  Post-op Assessment: Post-op Vital signs reviewed  Post-op Vital Signs: stable  Complications: No apparent anesthesia complications

## 2011-08-20 ENCOUNTER — Other Ambulatory Visit: Payer: Self-pay | Admitting: Plastic Surgery

## 2011-08-20 NOTE — Op Note (Signed)
Renee [name], ALBEE                ACCOUNT NO.:  [id number]  MEDICAL RECORD NO.:  [id number]  LOCATION:  [facility]                         FACILITY:  [facility]  PHYSICIAN:  Wayland Denis, DO      DATE OF BIRTH:  June 27, 1971  DATE OF PROCEDURE:  08/19/2011 DATE OF DISCHARGE:  08/19/2011                              OPERATIVE REPORT   PREOPERATIVE DIAGNOSIS:  Left breast cancer.  POSTOPERATIVE DIAGNOSIS:  Left breast cancer.  PROCEDURES: 1. Left breast capsulectomy with removal of expander and placement of     implant. 2. Right breast implant placement for symmetry.  ATTENDING SURGEON:  Wayland Denis, DO  ANESTHESIA:  General and 1% lidocaine with epinephrine.  INDICATION FOR PROCEDURE:  The patient is a 41 year old female who was diagnosed with left breast cancer and underwent mastectomy with radiation and opted for reconstruction with latissimus muscle flap and expander placement.  Risks and complications were explained and reviewed including capsular contracture, exposure, rupture, and followup plan. She was seen in holding, risks and complications were reviewed, the patient was marked, and consent was confirmed.  DESCRIPTION OF PROCEDURE:  The patient was taken to the operating room and placed on the operating room table in supine position.  General anesthesia was administered.  Once adequate, time-out was called and all information was confirmed to be correct.  She was prepped and draped in the usual sterile fashion.  The lower incision on the left breast was excised and sent to pathology.  The Bovie was then used to dissect down to the expander.  Once it was located, the fluid was evacuated and the expander was removed.  The decision was made to place a Mentor smooth, round, high-profile gel implant, reference #[id number], lot [id number], SN #[id number].  Mentor 350 implant was placed.  Capsulectomy was done prior to placement of the implant laterally and that was sent  to pathology.  The capsule was closed with 3-0 PDS.  Hemostasis was achieved with electrocautery and the capsule was released from medial inferior aspect and superiorly.  The deep layers were closed with 3-0 Vicryl to reapproximate the deep layers, 4-0 Vicryl next, and then finally a 5-0 Monocryl to reapproximate the skin edges.  Attention was then turned to the right breast.  A 2.5-3 cm incision was made right around the inframammary fold.  The Bovie was used to dissect down to the pectoralis major muscle which was lifted to create a pocket for the implant.  Sizer was placed and the decision was made for the 225 mL Mentor Moderate Plus profile gel implant, reference #[id number], lot [id number], SN #[id number].  The patient was sat up to observe symmetry. She has had good symmetry.  The deep layers were closed with 3-0 Vicryl, 4-0 Vicryl was used to close the next layer, and the skin was reapproximated with 5-0 Monocryl.  The patient was then turned to the right lateral position and prepped to remove the dog ear from the latissimus flap.  The area was marked with a marking pen and 1% lidocaine with epinephrine was injected into the area after waiting several minutes for the epinephrine to take effect.  The #15 blade was used  to excise in an elliptical fashion the dog ear, and the deep layers were closed with 4-0 Vicryl and 5-0 Monocryl was used to reapproximate the skin edges.  Dermabond was applied.  The patient tolerated the procedure well.  Mepilex was placed. There were no complications.  She was awakened and taken to recovery room in stable condition.     Wayland Denis, DO     CS/MEDQ  D:  08/19/2011  T:  08/20/2011  Job:  [id number]

## 2011-09-06 ENCOUNTER — Other Ambulatory Visit: Payer: Self-pay | Admitting: Internal Medicine

## 2011-11-08 ENCOUNTER — Other Ambulatory Visit (HOSPITAL_COMMUNITY): Payer: Self-pay | Admitting: Oncology

## 2011-11-08 DIAGNOSIS — C50919 Malignant neoplasm of unspecified site of unspecified female breast: Secondary | ICD-10-CM

## 2011-11-08 DIAGNOSIS — Z139 Encounter for screening, unspecified: Secondary | ICD-10-CM

## 2011-11-12 ENCOUNTER — Ambulatory Visit (HOSPITAL_COMMUNITY)
Admission: RE | Admit: 2011-11-12 | Discharge: 2011-11-12 | Disposition: A | Discharge status: Home / Self Care | Payer: Medicaid Other | Source: Ambulatory Visit | Attending: Oncology | Admitting: Oncology

## 2011-11-12 ENCOUNTER — Other Ambulatory Visit (HOSPITAL_COMMUNITY): Payer: Self-pay | Admitting: Oncology

## 2011-11-12 DIAGNOSIS — Z139 Encounter for screening, unspecified: Secondary | ICD-10-CM

## 2011-11-12 DIAGNOSIS — Z1231 Encounter for screening mammogram for malignant neoplasm of breast: Secondary | ICD-10-CM | POA: Insufficient documentation

## 2011-11-12 DIAGNOSIS — Z978 Presence of other specified devices: Secondary | ICD-10-CM | POA: Insufficient documentation

## 2011-11-24 ENCOUNTER — Inpatient Hospital Stay (HOSPITAL_COMMUNITY): Admission: RE | Admit: 2011-11-24 | Payer: Medicaid Other | Source: Ambulatory Visit

## 2011-11-24 ENCOUNTER — Encounter (HOSPITAL_COMMUNITY): Payer: Medicaid Other

## 2011-12-08 ENCOUNTER — Encounter (HOSPITAL_COMMUNITY): Payer: Self-pay

## 2011-12-08 ENCOUNTER — Emergency Department (HOSPITAL_COMMUNITY)
Admission: EM | Admit: 2011-12-08 | Discharge: 2011-12-08 | Disposition: A | Discharge status: Home / Self Care | Payer: Medicaid Other | Attending: Emergency Medicine | Admitting: Emergency Medicine

## 2011-12-08 ENCOUNTER — Emergency Department (HOSPITAL_COMMUNITY): Payer: Medicaid Other

## 2011-12-08 DIAGNOSIS — M545 Low back pain, unspecified: Secondary | ICD-10-CM | POA: Insufficient documentation

## 2011-12-08 DIAGNOSIS — Z853 Personal history of malignant neoplasm of breast: Secondary | ICD-10-CM | POA: Insufficient documentation

## 2011-12-08 DIAGNOSIS — E079 Disorder of thyroid, unspecified: Secondary | ICD-10-CM | POA: Insufficient documentation

## 2011-12-08 DIAGNOSIS — Z79899 Other long term (current) drug therapy: Secondary | ICD-10-CM | POA: Insufficient documentation

## 2011-12-08 DIAGNOSIS — R109 Unspecified abdominal pain: Secondary | ICD-10-CM | POA: Insufficient documentation

## 2011-12-08 LAB — URINALYSIS, ROUTINE W REFLEX MICROSCOPIC
Glucose, UA: NEGATIVE mg/dL
Ketones, ur: NEGATIVE mg/dL
Leukocytes, UA: NEGATIVE
pH: 6 (ref 5.0–8.0)

## 2011-12-08 MED ORDER — SODIUM CHLORIDE 0.9 % IV SOLN
INTRAVENOUS | Status: DC
  Administered 2011-12-08: 11:00:00 via INTRAVENOUS

## 2011-12-08 MED ORDER — HYDROMORPHONE HCL PF 1 MG/ML IJ SOLN
1.0000 mg | Freq: Once | INTRAMUSCULAR | Status: AC
  Administered 2011-12-08: 1 mg via INTRAVENOUS
  Filled 2011-12-08: qty 1

## 2011-12-08 MED ORDER — ONDANSETRON HCL 4 MG/2ML IJ SOLN
4.0000 mg | Freq: Once | INTRAMUSCULAR | Status: AC
  Administered 2011-12-08: 4 mg via INTRAVENOUS
  Filled 2011-12-08: qty 2

## 2011-12-08 MED ORDER — OXYCODONE-ACETAMINOPHEN 5-325 MG PO TABS: 1.0000 | ORAL_TABLET | ORAL | Status: AC | PRN

## 2011-12-08 NOTE — Discharge Instructions (Signed)
Renee [name], your tests today show that you have a stone in the right kidney.  There was no urinary tract infection.  You can take the pain medicine Percocet every 4 hours if needed for pain.  Call Dr. Madilyn Hook office at [facility] for a followup appointment.

## 2011-12-08 NOTE — ED Notes (Signed)
Pt c/o r flank pain with nausea x 1 1/2 weeks.  Reports has "lodged kidney stones."

## 2011-12-08 NOTE — ED Provider Notes (Signed)
History    This chart was scribed for Osvaldo Human, MD, MD by Smitty Pluck. The patient was seen in room APA08 and the patient's care was started at 10:35AM.   CSN: [id number]  Arrival date & time 12/08/11  5284   First MD Initiated Contact with Patient 12/08/11 1027      Chief Complaint  Patient presents with  . Flank Pain    (Consider location/radiation/quality/duration/timing/severity/associated sxs/prior treatment) Patient is a 41 y.o. female presenting with flank pain. The history is provided by the patient.  Flank Pain   Renee [name] is a 41 y.o. female who presents to the Emergency Department complaining of moderate lower back pain radiating to front right side onset 1.5 weeks ago. Pt has hx of kidney stones. Pt has taken ibuprofen without relief. Denies hematuria, fever and injury to back. Pt takes tomoxifen for breast cancer (pt has had mastectomy of left breast). Pt is not allergic to medication. Pt does not smoke or drink. Pt denies chest pain and leg pain. Symptoms have been constant since onset. Pt has seen urologist previously. Pt reports that her urine has been darker than normal.   PCP is Dr. Amador Cunas  Past Medical History  Diagnosis Date  . Breast cancer     chemo  . Thyroid disease   . Hypothyroidism     Past Surgical History  Procedure Date  . Mastectomy   . Portacath placement   . Port-a-cath removal   . Breast reconstruction 01/2011    Family History  Problem Relation Age of Onset  . Hypertension Mother   . Diabetes Father   . Stroke Father   . Hypertension Father   . Hypertension Maternal Grandmother   . Diabetes Paternal Grandmother   . Cancer Paternal Grandfather     History  Substance Use Topics  . Smoking status: Former Smoker -- 0.5 packs/day for 14 years    Quit date: 09/22/2010  . Smokeless tobacco: Never Used  . Alcohol Use: No    OB History    Grav Para Term Preterm Abortions TAB SAB Ect Mult Living                   Review of Systems  Genitourinary: Positive for flank pain.  All other systems reviewed and are negative.   10 Systems reviewed and all are negative for acute change except as noted in the HPI.   Allergies  Review of patient's allergies indicates no known allergies.  Home Medications   Current Outpatient Rx  Name Route Sig Dispense Refill  . VITAMIN C PO Oral Take 1 tablet by mouth daily.      Marland Kitchen VITAMIN B-12 CR PO Oral Take 1 tablet by mouth daily.      Carma Leaven M PLUS PO TABS Oral Take 1 tablet by mouth daily.      Marland Kitchen SYNTHROID 112 MCG PO TABS  TAKE ONE TABLET BY MOUTH EVERY DAY 90 each 0    Dispense as written.  . TAMOXIFEN CITRATE 10 MG PO TABS  TAKE ONE TABLET BY MOUTH TWICE DAILY 60 tablet 6    BP 131/73  Pulse 70  Temp(Src) 97.9 F (36.6 C) (Oral)  Resp 20  Ht 5' (1.524 m)  Wt 132 lb (59.875 kg)  BMI 25.78 kg/m2  SpO2 94%  Physical Exam  Nursing note and vitals reviewed. Constitutional: She is oriented to person, place, and time. She appears well-developed and well-nourished. No distress.  HENT:  Head:  Normocephalic and atraumatic.  Eyes: Conjunctivae are normal. Pupils are equal, round, and reactive to light.  Neck: Normal range of motion. No thyromegaly present.  Pulmonary/Chest: Effort normal. No respiratory distress.  Abdominal: Soft. She exhibits no distension.  Neurological: She is alert and oriented to person, place, and time.  Skin: Skin is warm and dry.  Psychiatric: She has a normal mood and affect. Her behavior is normal.    ED Course  Procedures (including critical care time) DIAGNOSTIC STUDIES: Oxygen Saturation is 94% on room air, adequate by my interpretation.    COORDINATION OF CARE: 10:43AM EDP discusses pt ED treatment with pt 10:45AM EDP ordered medication: dilaudid 1 mg, zofran 4 mg, 0.9% NaCl 12:42PM EDP rechecks pt. Discusses pt lab results with pt.    Labs Reviewed  URINALYSIS, ROUTINE W REFLEX MICROSCOPIC  PREGNANCY, URINE   URINE CULTURE   Ct Abdomen Pelvis Wo Contrast  12/08/2011  *RADIOLOGY REPORT*  Clinical Data: Right flank pain, history of kidney stones.  Left breast cancer diagnosed 2010, status post mastectomy and oral chemotherapy.  CT ABDOMEN AND PELVIS WITHOUT CONTRAST  Technique:  Multidetector CT imaging of the abdomen and pelvis was performed following the standard protocol without intravenous contrast.  Comparison: 06/09/2011  Findings: Lung bases are essentially clear.  Unenhanced liver is notable for a tiny probable cyst in the left hepatic dome (series 2/image 6).  Unenhanced spleen, pancreas, and adrenal glands are within normal limits.  Gallbladder is unremarkable.  No intrahepatic or extrahepatic ductal dilatation.  Stable 4 mm nonobstructing interpolar right renal calculus. Suspected 1.5 x 1.4 cm right upper pole cyst (series 2/image 22), incompletely characterized in the absence of intravenous contrast. Left kidney is unremarkable.  No hydronephrosis.  No evidence of bowel obstruction.  Normal appendix.  Atherosclerotic calcifications of the abdominal aorta and branch vessels.  No abdominopelvic ascites.  No suspicious abdominopelvic lymphadenopathy.  Uterus and bilateral ovaries are unremarkable.  No ureteral or bladder calculi.  Visualized osseous structures are within normal limits.  IMPRESSION: Stable 4 mm nonobstructing interpolar right renal calculus.  No ureteral or bladder calculi.  No hydronephrosis.  Suspected 1.5 x 1.4 cm right upper pole cyst, incompletely characterized in the absence of intravenous contrast.  Normal appendix.  No evidence of bowel obstruction.  No CT findings to account for the patient's right abdominal pain.  Original Report Authenticated By: Charline Bills, M.D.     1. Flank pain      I personally performed the services described in this documentation, which was scribed in my presence. The recorded information has been reviewed and considered.  Osvaldo Human,  M.D.         Carleene Cooper III, MD 12/08/11 [phone]

## 2011-12-10 LAB — URINE CULTURE
Colony Count: NO GROWTH
Culture  Setup Time: 201305020233

## 2011-12-13 ENCOUNTER — Other Ambulatory Visit (HOSPITAL_COMMUNITY): Payer: Medicaid Other

## 2011-12-15 ENCOUNTER — Ambulatory Visit (HOSPITAL_COMMUNITY): Payer: Medicaid Other | Admitting: Oncology

## 2012-01-28 ENCOUNTER — Encounter (HOSPITAL_COMMUNITY): Payer: Medicaid Other | Attending: Oncology | Admitting: Oncology

## 2012-01-28 ENCOUNTER — Encounter (HOSPITAL_COMMUNITY): Payer: Self-pay | Admitting: Oncology

## 2012-01-28 VITALS — BP 125/81 | HR 83 | Temp 98.6°F | Wt 132.4 lb

## 2012-01-28 DIAGNOSIS — Z853 Personal history of malignant neoplasm of breast: Secondary | ICD-10-CM | POA: Insufficient documentation

## 2012-01-28 DIAGNOSIS — Z09 Encounter for follow-up examination after completed treatment for conditions other than malignant neoplasm: Secondary | ICD-10-CM | POA: Insufficient documentation

## 2012-01-28 DIAGNOSIS — Z170 Estrogen receptor positive status [ER+]: Secondary | ICD-10-CM

## 2012-01-28 DIAGNOSIS — T50905S Adverse effect of unspecified drugs, medicaments and biological substances, sequela: Secondary | ICD-10-CM | POA: Insufficient documentation

## 2012-01-28 DIAGNOSIS — C50919 Malignant neoplasm of unspecified site of unspecified female breast: Secondary | ICD-10-CM

## 2012-01-28 DIAGNOSIS — C50419 Malignant neoplasm of upper-outer quadrant of unspecified female breast: Secondary | ICD-10-CM

## 2012-01-28 DIAGNOSIS — G609 Hereditary and idiopathic neuropathy, unspecified: Secondary | ICD-10-CM | POA: Insufficient documentation

## 2012-01-28 DIAGNOSIS — T451X5A Adverse effect of antineoplastic and immunosuppressive drugs, initial encounter: Secondary | ICD-10-CM | POA: Insufficient documentation

## 2012-01-28 LAB — CBC
HCT: 40.1 % (ref 36.0–46.0)
MCH: 34.5 pg — ABNORMAL HIGH (ref 26.0–34.0)
MCHC: 33.7 g/dL (ref 30.0–36.0)
MCV: 102.6 fL — ABNORMAL HIGH (ref 78.0–100.0)
RDW: 13.9 % (ref 11.5–15.5)

## 2012-01-28 LAB — DIFFERENTIAL
Basophils Absolute: 0 10*3/uL (ref 0.0–0.1)
Basophils Relative: 0 % (ref 0–1)
Eosinophils Relative: 2 % (ref 0–5)
Monocytes Absolute: 0.5 10*3/uL (ref 0.1–1.0)

## 2012-01-28 LAB — COMPREHENSIVE METABOLIC PANEL
AST: 18 U/L (ref 0–37)
Albumin: 3.7 g/dL (ref 3.5–5.2)
CO2: 29 mEq/L (ref 19–32)
Calcium: 9.6 mg/dL (ref 8.4–10.5)
Creatinine, Ser: 0.84 mg/dL (ref 0.50–1.10)
GFR calc non Af Amer: 85 mL/min — ABNORMAL LOW (ref >90)

## 2012-01-28 NOTE — Patient Instructions (Addendum)
Renee [name]  [id number] August 21, 1970 Dr. Glenford Peers   [facility]  Discharge Instructions  RECOMMENDATIONS MADE BY THE CONSULTANT AND ANY TEST RESULTS WILL BE SENT TO YOUR REFERRING DOCTOR.   EXAM FINDINGS BY MD TODAY AND SIGNS AND SYMPTOMS TO REPORT TO CLINIC OR PRIMARY MD: exam and discussion per MD.  You are doing well.  Continue your nolvadex.  MEDICATIONS PRESCRIBED: none   INSTRUCTIONS GIVEN AND DISCUSSED: Other :  Report any new lumps, bone pain or shortness of breath.  SPECIAL INSTRUCTIONS/FOLLOW-UP: Return to Clinic in 6 months.   I acknowledge that I have been informed and understand all the instructions given to me and received a copy. I do not have any more questions at this time, but understand that I may call the Specialty Clinic at [facility] at [phone] during business hours should I have any further questions or need assistance in obtaining follow-up care.    __________________________________________  _____________  __________ Signature of Patient or Authorized Representative            Date                   Time    __________________________________________ Nurse's Signature

## 2012-01-28 NOTE — Progress Notes (Signed)
CC:   Wayland Denis, DO Dalia Heading, M.D. Radene Gunning, M.D., Ph.D. Gordy Savers, MD  DIAGNOSES: 1. 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  ER receptors were 96%, PR     receptors were 11%, Ki-67 marker 20%, HER-2/neu was negative.  She     is status post surgery followed by chemotherapy with dose0dense     epirubicin and Cytoxan for 4 cycles, followed by weekly Taxol for     12 weeks.  She finished radiation therapy as well by Dr. Mitzi Hansen and     then started tamoxifen.  She started her tamoxifen on 06/23/2010.     Her date of surgery was 10/08/2009.  She has recently undergone     left breast reconstruction with right breast augmentation as well. 2. Tattoos of the right upper back, low back. 3. History of sun tanning use in a tanning bed, and we have tried to     discourage that today. 4. History of smoking, though she quit several years ago.  She quit as     of 09/22/2009, having smoked for a number of years. 5. 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 She is now working for [employer], very happy that she is employed.  She lives by herself.  She is divorced.  She has 2 daughters, 1 of whom smokes.  She states that today that her mother, who also smoked in the past, had a heart attack recently but is doing well from that.  Linea herself could not feel better, she states.  Her oncologic review of systems is negative.  Her vital signs are very good.  Weight is 132 pounds.  That is down from 139.8 pounds a year ago.  Her blood pressure 125/81 right arm sitting position, pulse 80 and regular, respirations 16 and unlabored.  She is afebrile.  She denies any pain.  Lymph nodes:  Negative throughout.  The scar on the left  posterior flank has healed very well from her breast augmentation surgery on the left where she had a flap procedure.  The left breast itself is healing very nicely.  She has had a great result. The right breast is healing very nicely.  No masses on either side. Skin looks fine.  She is very tan, though.  She has a new tattoo on the right upper back.  She has a soft abdomen.  Clear lung fields, by the way.  No organomegaly.  She has no peripheral edema.  She looks great.  We will do a baseline CBC and diff and CMET, see her back in 6 months.  She will continue with her tamoxifen, which will continue for 5 years and realistically probably 10 years if all goes well.    ______________________________ Ladona Horns. Mariel Sleet, MD ESN/MEDQ  D:  01/28/2012  T:  01/28/2012  Job:  [id number]

## 2012-01-28 NOTE — Progress Notes (Signed)
This office note has been dictated.

## 2012-02-01 DIAGNOSIS — Z9010 Acquired absence of unspecified breast and nipple: Secondary | ICD-10-CM | POA: Insufficient documentation

## 2012-03-17 ENCOUNTER — Other Ambulatory Visit: Payer: Self-pay | Admitting: Internal Medicine

## 2012-03-30 ENCOUNTER — Other Ambulatory Visit (HOSPITAL_COMMUNITY): Payer: Self-pay | Admitting: Oncology

## 2012-04-26 ENCOUNTER — Telehealth (HOSPITAL_COMMUNITY): Payer: Self-pay | Admitting: *Deleted

## 2012-04-26 NOTE — Telephone Encounter (Signed)
Pt came by c/o sore nodule under left axilla. Tobie Lords RN and myself palpated area. Very tiny, smaller than a pea, tender to palpation. She just noticed it last night, she is going to use warm compresses for 48 hrs and will let us know if it persists.

## 2012-05-10 ENCOUNTER — Telehealth (HOSPITAL_COMMUNITY): Payer: Self-pay | Admitting: *Deleted

## 2012-05-10 NOTE — Telephone Encounter (Signed)
Message left for pt to call in f/u of small nodule she had felt last week

## 2012-05-24 ENCOUNTER — Other Ambulatory Visit: Payer: Self-pay | Admitting: Internal Medicine

## 2012-05-30 ENCOUNTER — Ambulatory Visit (INDEPENDENT_AMBULATORY_CARE_PROVIDER_SITE_OTHER): Payer: Medicaid Other | Admitting: Internal Medicine

## 2012-05-30 ENCOUNTER — Encounter: Payer: Self-pay | Admitting: Internal Medicine

## 2012-05-30 VITALS — BP 98/90 | Temp 97.9°F | Wt 136.0 lb

## 2012-05-30 DIAGNOSIS — E039 Hypothyroidism, unspecified: Secondary | ICD-10-CM

## 2012-05-30 DIAGNOSIS — C50919 Malignant neoplasm of unspecified site of unspecified female breast: Secondary | ICD-10-CM

## 2012-05-30 MED ORDER — SYNTHROID 112 MCG PO TABS: 112.0000 ug | ORAL_TABLET | Freq: Every day | ORAL | Status: DC

## 2012-05-30 NOTE — Patient Instructions (Signed)
Call or return to clinic prn if these symptoms worsen or fail to improve as anticipated.

## 2012-05-30 NOTE — Progress Notes (Signed)
Subjective:    Patient ID: Renee [name], female    DOB: 1970-08-29, 41 y.o.   MRN: [id number]  HPI  41 year old patient who is seen today for followup. She has a history of Graves' disease and is now on supplemental Synthroid at a dose of 0.112 mg daily. No recent TSH. She is followed closely by oncology do to left breast adenocarcinoma diagnosed in 2010. She does quite well.  Past Medical History  Diagnosis Date  . Breast cancer     2010 ; left side mastectomy chemo pill  . Thyroid disease   . Hypothyroidism     History   Social History  . Marital Status: Divorced    Spouse Name: N/A    Number of Children: N/A  . Years of Education: N/A   Occupational History  . Not on file.   Social History Main Topics  . Smoking status: Former Smoker -- 0.5 packs/day for 14 years    Quit date: 09/22/2010  . Smokeless tobacco: Never Used  . Alcohol Use: No  . Drug Use: No  . Sexually Active:    Other Topics Concern  . Not on file   Social History Narrative  . No narrative on file    Past Surgical History  Procedure Date  . Mastectomy     left side  . Portacath placement   . Port-a-cath removal   . Breast reconstruction 01/2011    Family History  Problem Relation Age of Onset  . Hypertension Mother   . Diabetes Father   . Stroke Father   . Hypertension Father   . Hypertension Maternal Grandmother   . Diabetes Paternal Grandmother   . Cancer Paternal Grandfather     No Known Allergies  Current Outpatient Prescriptions on File Prior to Visit  Medication Sig Dispense Refill  . ascorbic acid (VITAMIN C) 1000 MG tablet Take 1,000 mg by mouth every morning.      . Multiple Vitamins-Minerals (MULTIVITAMINS THER. W/MINERALS) TABS Take 1 tablet by mouth daily.        Marland Kitchen SYNTHROID 112 MCG tablet TAKE ONE TABLET BY MOUTH EVERY DAY  60 each  0  . tamoxifen (NOLVADEX) 10 MG tablet TAKE ONE TABLET BY MOUTH TWICE DAILY  60 tablet  5  . vitamin B-12 (CYANOCOBALAMIN) 1000 MCG  tablet Take 2,000 mcg by mouth every morning.        BP 98/90  Temp 97.9 F (36.6 C) (Oral)  Wt 136 lb (61.689 kg)      Review of Systems  Constitutional: Negative.   HENT: Negative for hearing loss, congestion, sore throat, rhinorrhea, dental problem, sinus pressure and tinnitus.   Eyes: Negative for pain, discharge and visual disturbance.  Respiratory: Negative for cough and shortness of breath.   Cardiovascular: Negative for chest pain, palpitations and leg swelling.  Gastrointestinal: Negative for nausea, vomiting, abdominal pain, diarrhea, constipation, blood in stool and abdominal distention.  Genitourinary: Negative for dysuria, urgency, frequency, hematuria, flank pain, vaginal bleeding, vaginal discharge, difficulty urinating, vaginal pain and pelvic pain.  Musculoskeletal: Negative for joint swelling, arthralgias and gait problem.  Skin: Negative for rash.  Neurological: Negative for dizziness, syncope, speech difficulty, weakness, numbness and headaches.  Hematological: Negative for adenopathy.  Psychiatric/Behavioral: Negative for behavioral problems, dysphoric mood and agitation. The patient is not nervous/anxious.        Objective:   Physical Exam  Constitutional: She is oriented to person, place, and time. She appears well-developed and well-nourished.  HENT:  Head: Normocephalic.  Right Ear: External ear normal.  Left Ear: External ear normal.  Mouth/Throat: Oropharynx is clear and moist.  Eyes: Conjunctivae normal and EOM are normal. Pupils are equal, round, and reactive to light.  Neck: Normal range of motion. Neck supple. No thyromegaly present.  Cardiovascular: Normal rate, regular rhythm, normal heart sounds and intact distal pulses.   Pulmonary/Chest: Effort normal and breath sounds normal.  Abdominal: Soft. Bowel sounds are normal. She exhibits no mass. There is no tenderness.  Musculoskeletal: Normal range of motion.  Lymphadenopathy:    She has no  cervical adenopathy.  Neurological: She is alert and oriented to person, place, and time.  Skin: Skin is warm and dry. No rash noted.  Psychiatric: She has a normal mood and affect. Her behavior is normal.          Assessment & Plan:   Hypothyroidism status post I-131 for Graves' disease Left breast cancer. Follow up oncology  A TSH  reviewed

## 2012-06-01 LAB — TSH: TSH: 1.93 u[IU]/mL (ref 0.35–5.50)

## 2012-06-05 NOTE — Progress Notes (Signed)
Quick Note:  Left normal test results with patient. ______

## 2012-07-31 ENCOUNTER — Encounter (HOSPITAL_COMMUNITY): Payer: Self-pay | Admitting: Oncology

## 2012-07-31 ENCOUNTER — Encounter (HOSPITAL_COMMUNITY): Payer: Medicaid Other | Attending: Oncology | Admitting: Oncology

## 2012-07-31 VITALS — BP 122/69 | HR 73 | Temp 98.2°F | Resp 16 | Wt 141.0 lb

## 2012-07-31 DIAGNOSIS — Z170 Estrogen receptor positive status [ER+]: Secondary | ICD-10-CM

## 2012-07-31 DIAGNOSIS — IMO0002 Reserved for concepts with insufficient information to code with codable children: Secondary | ICD-10-CM | POA: Insufficient documentation

## 2012-07-31 DIAGNOSIS — C50919 Malignant neoplasm of unspecified site of unspecified female breast: Secondary | ICD-10-CM

## 2012-07-31 DIAGNOSIS — Z09 Encounter for follow-up examination after completed treatment for conditions other than malignant neoplasm: Secondary | ICD-10-CM | POA: Insufficient documentation

## 2012-07-31 DIAGNOSIS — R55 Syncope and collapse: Secondary | ICD-10-CM | POA: Insufficient documentation

## 2012-07-31 DIAGNOSIS — W19XXXA Unspecified fall, initial encounter: Secondary | ICD-10-CM | POA: Insufficient documentation

## 2012-07-31 DIAGNOSIS — Z853 Personal history of malignant neoplasm of breast: Secondary | ICD-10-CM | POA: Insufficient documentation

## 2012-07-31 DIAGNOSIS — G609 Hereditary and idiopathic neuropathy, unspecified: Secondary | ICD-10-CM | POA: Insufficient documentation

## 2012-07-31 DIAGNOSIS — C50419 Malignant neoplasm of upper-outer quadrant of unspecified female breast: Secondary | ICD-10-CM

## 2012-07-31 LAB — COMPREHENSIVE METABOLIC PANEL
BUN: 14 mg/dL (ref 6–23)
Calcium: 9.5 mg/dL (ref 8.4–10.5)
Creatinine, Ser: 0.82 mg/dL (ref 0.50–1.10)
GFR calc Af Amer: >90 mL/min (ref >90)
Glucose, Bld: 83 mg/dL (ref 70–99)
Sodium: 138 mEq/L (ref 135–145)
Total Protein: 7.2 g/dL (ref 6.0–8.3)

## 2012-07-31 LAB — CBC WITH DIFFERENTIAL/PLATELET
Eosinophils Absolute: 0.1 10*3/uL (ref 0.0–0.7)
Eosinophils Relative: 2 % (ref 0–5)
Lymphs Abs: 2.8 10*3/uL (ref 0.7–4.0)
MCH: 33.7 pg (ref 26.0–34.0)
MCV: 100.3 fL — ABNORMAL HIGH (ref 78.0–100.0)
Monocytes Relative: 7 % (ref 3–12)
Platelets: 194 10*3/uL (ref 150–400)
RBC: 3.83 MIL/uL — ABNORMAL LOW (ref 3.87–5.11)

## 2012-07-31 MED ORDER — TAMOXIFEN CITRATE 10 MG PO TABS: 20.0000 mg | ORAL_TABLET | Freq: Every day | ORAL | Status: DC

## 2012-07-31 NOTE — Patient Instructions (Addendum)
.  [facility] Discharge Instructions  RECOMMENDATIONS MADE BY THE CONSULTANT AND ANY TEST RESULTS WILL BE SENT TO YOUR REFERRING PHYSICIAN.  EXAM FINDINGS BY THE PHYSICIAN TODAY AND SIGNS OR SYMPTOMS TO REPORT TO CLINIC OR PRIMARY PHYSICIAN:  Exam good  MEDICATIONS PRESCRIBED:  Tamoxifen 20 mg daily   SPECIAL INSTRUCTIONS/FOLLOW-UP: 6 months   Thank you for choosing [facility] to provide your oncology and hematology care.  To afford each patient quality time with our providers, please arrive at least 15 minutes before your scheduled appointment time.  With your help, our goal is to use those 15 minutes to complete the necessary work-up to ensure our physicians have the information they need to help with your evaluation and healthcare recommendations.    Effective January 1st, 2014, we ask that you re-schedule your appointment with our physicians should you arrive 10 or more minutes late for your appointment.  We strive to give you quality time with our providers, and arriving late affects you and other patients whose appointments are after yours.    Again, thank you for choosing [facility]  Our hope is that these requests will decrease the amount of time that you wait before being seen by our physicians.       _____________________________________________________________  I acknowledge that I have been informed and understand all the instructions given to me and received a copy. I do not have anymore questions at this time but understand that I may call the Cancer Center at [facility] at [phone] during business hours should I have any further questions or need assistance in obtaining follow-up care.

## 2012-07-31 NOTE — Progress Notes (Signed)
Problem #1 stage IIIa (T3, N1, and asked), grade 3, poorly differentiated infiltrating ductal carcinoma the breast on the left with 2 of 3 positive lymph nodes, both metastases greater than 3 mm, with extracapsular extension but no LV I. Estrogen receptors 96%, progesterone receptors 11%, Ki-67 marker 20%, HER-2/neu not amplified. She is status post dose dense epirubicin and Cytoxan for 4 cycles followed by weekly Taxol for 12 weeks. She then had radiation therapy by Dr. Mitzi Hansen. She then started tamoxifen on 06/23/2010. She will probably take this for 10 years. Date of surgery was 10/08/2009. She is undergoing left breast reconstruction without Dr. Kelly Splinter. Presently she is undergoing tattooing of the nipple areola area. 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 She took herself off the seizure medication within the last 12 years. Problem #3 syncopal episode this past Friday with facial trauma right shoulder trauma that was mild but she does have abrasions on her face and shoulder. The most striking ones are the right cheek right chin and very small one on the right frontal area. She does not know how long she was unconscious. She does not have other obvious seizure symptoms. Problem #4 history of smoking though she has quit. She smoked approximately a pack a day for 14-15 years. 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 She is not able to exercise as she wants that. She is doing well and did well until she had this syncopal episode this past Friday and she thinks that stress related. Family issues are a major concern for her. She has been trying to help her youngest daughter financially  however her youngest daughter does not want to help her cell financially. She is putting an and 2 that economic tie. Her bowels are working well, her breathing is quite good and she is not aware of any lumps or bumps anywhere. She did not lose control of her urine or bowels when she passed out. BP 122/69  Pulse 73  Temp 98.2 F (36.8 C) (Oral)  Resp 16  Wt 141 lb (63.957 kg)  Lymphadenopathy is not present in any location. Tattoos on her low back and right upper shoulder posteriorly are the same. Lungs are clear. Left posterior chest incision is well-healed. The left breast is reconstructed is negative for masses. The right breast which is reconstructed is negative for masses. Heart shows a grade 1 very soft systolic ejection murmur. There is no S3 gallop. Abdomen is soft and nontender without again in [place]. Bowel sounds are diminished. She has no leg edema no arm edema.  We will see her in 6 more months and she wants me to change her tamoxifen 10 mg twice a day to 20 mg once a day which I take care of. If she has further episodes of syncope I want her to call us. Her blood work is pending from today.

## 2012-08-01 ENCOUNTER — Other Ambulatory Visit (HOSPITAL_COMMUNITY): Payer: Self-pay | Admitting: *Deleted

## 2012-08-01 DIAGNOSIS — D509 Iron deficiency anemia, unspecified: Secondary | ICD-10-CM

## 2012-08-01 DIAGNOSIS — C50919 Malignant neoplasm of unspecified site of unspecified female breast: Secondary | ICD-10-CM

## 2012-08-01 LAB — FOLATE: Folate: >20 ng/mL

## 2012-08-01 LAB — VITAMIN B12: Vitamin B-12: 1800 pg/mL — ABNORMAL HIGH (ref 211–911)

## 2012-09-12 ENCOUNTER — Encounter (HOSPITAL_COMMUNITY): Payer: Self-pay | Admitting: Internal Medicine

## 2012-09-12 ENCOUNTER — Other Ambulatory Visit: Payer: Self-pay | Admitting: Physician Assistant

## 2012-09-12 ENCOUNTER — Observation Stay (HOSPITAL_COMMUNITY)
Admission: AD | Admit: 2012-09-12 | Discharge: 2012-09-13 | Disposition: A | Discharge status: Home / Self Care | Payer: Medicaid Other | Source: Ambulatory Visit | Attending: Family Medicine | Admitting: Family Medicine

## 2012-09-12 ENCOUNTER — Ambulatory Visit (HOSPITAL_COMMUNITY)
Admission: RE | Admit: 2012-09-12 | Discharge: 2012-09-12 | Disposition: A | Discharge status: Home / Self Care | Payer: Medicaid Other | Source: Ambulatory Visit | Attending: Physician Assistant | Admitting: Physician Assistant

## 2012-09-12 DIAGNOSIS — Z79899 Other long term (current) drug therapy: Secondary | ICD-10-CM | POA: Insufficient documentation

## 2012-09-12 DIAGNOSIS — I82629 Acute embolism and thrombosis of deep veins of unspecified upper extremity: Secondary | ICD-10-CM | POA: Diagnosis present

## 2012-09-12 DIAGNOSIS — M79609 Pain in unspecified limb: Secondary | ICD-10-CM

## 2012-09-12 DIAGNOSIS — M7989 Other specified soft tissue disorders: Secondary | ICD-10-CM

## 2012-09-12 DIAGNOSIS — Z853 Personal history of malignant neoplasm of breast: Secondary | ICD-10-CM | POA: Insufficient documentation

## 2012-09-12 DIAGNOSIS — D649 Anemia, unspecified: Secondary | ICD-10-CM

## 2012-09-12 DIAGNOSIS — I82609 Acute embolism and thrombosis of unspecified veins of unspecified upper extremity: Principal | ICD-10-CM | POA: Insufficient documentation

## 2012-09-12 DIAGNOSIS — E039 Hypothyroidism, unspecified: Secondary | ICD-10-CM | POA: Diagnosis present

## 2012-09-12 DIAGNOSIS — Z923 Personal history of irradiation: Secondary | ICD-10-CM | POA: Insufficient documentation

## 2012-09-12 DIAGNOSIS — C50919 Malignant neoplasm of unspecified site of unspecified female breast: Secondary | ICD-10-CM

## 2012-09-12 DIAGNOSIS — E079 Disorder of thyroid, unspecified: Secondary | ICD-10-CM

## 2012-09-12 LAB — COMPREHENSIVE METABOLIC PANEL
Albumin: 3.6 g/dL (ref 3.5–5.2)
Alkaline Phosphatase: 56 U/L (ref 39–117)
BUN: 13 mg/dL (ref 6–23)
CO2: 24 mEq/L (ref 19–32)
Chloride: 103 mEq/L (ref 96–112)
Creatinine, Ser: 0.67 mg/dL (ref 0.50–1.10)
GFR calc non Af Amer: >90 mL/min (ref >90)
Glucose, Bld: 156 mg/dL — ABNORMAL HIGH (ref 70–99)
Potassium: 3.4 mEq/L — ABNORMAL LOW (ref 3.5–5.1)
Total Bilirubin: 0.3 mg/dL (ref 0.3–1.2)

## 2012-09-12 LAB — CBC
HCT: 37.1 % (ref 36.0–46.0)
Hemoglobin: 12.8 g/dL (ref 12.0–15.0)
MCH: 34.3 pg — ABNORMAL HIGH (ref 26.0–34.0)
MCV: 99.5 fL (ref 78.0–100.0)
Platelets: 168 10*3/uL (ref 150–400)
RBC: 3.73 MIL/uL — ABNORMAL LOW (ref 3.87–5.11)
WBC: 8.2 10*3/uL (ref 4.0–10.5)

## 2012-09-12 LAB — MAGNESIUM: Magnesium: 1.8 mg/dL (ref 1.5–2.5)

## 2012-09-12 MED ORDER — LEVOTHYROXINE SODIUM 112 MCG PO TABS
112.0000 ug | ORAL_TABLET | Freq: Every day | ORAL | Status: DC
  Administered 2012-09-13: 112 ug via ORAL
  Filled 2012-09-12 (×2): qty 1

## 2012-09-12 MED ORDER — TAMOXIFEN CITRATE 10 MG PO TABS: 20.0000 mg | ORAL_TABLET | Freq: Every day | ORAL | Status: DC

## 2012-09-12 MED ORDER — ONDANSETRON HCL 4 MG PO TABS: 4.0000 mg | ORAL_TABLET | Freq: Four times a day (QID) | ORAL | Status: DC | PRN

## 2012-09-12 MED ORDER — ACETAMINOPHEN 650 MG RE SUPP: 650.0000 mg | Freq: Four times a day (QID) | RECTAL | Status: DC | PRN

## 2012-09-12 MED ORDER — RIVAROXABAN 15 MG PO TABS
15.0000 mg | ORAL_TABLET | Freq: Two times a day (BID) | ORAL | Status: DC
  Administered 2012-09-12 – 2012-09-13 (×2): 15 mg via ORAL
  Filled 2012-09-12 (×3): qty 1

## 2012-09-12 MED ORDER — VITAMIN B-12 1000 MCG PO TABS
1000.0000 ug | ORAL_TABLET | Freq: Every morning | ORAL | Status: DC
  Administered 2012-09-13: 1000 ug via ORAL
  Filled 2012-09-12: qty 1

## 2012-09-12 MED ORDER — ACETAMINOPHEN 325 MG PO TABS: 650.0000 mg | ORAL_TABLET | Freq: Four times a day (QID) | ORAL | Status: DC | PRN

## 2012-09-12 MED ORDER — ADULT MULTIVITAMIN W/MINERALS CH
1.0000 | ORAL_TABLET | Freq: Every day | ORAL | Status: DC
  Administered 2012-09-13: 1 via ORAL
  Filled 2012-09-12: qty 1

## 2012-09-12 MED ORDER — ONDANSETRON HCL 4 MG/2ML IJ SOLN: 4.0000 mg | Freq: Four times a day (QID) | INTRAMUSCULAR | Status: DC | PRN

## 2012-09-12 MED ORDER — OXYCODONE HCL 5 MG PO TABS
5.0000 mg | ORAL_TABLET | ORAL | Status: DC | PRN
  Administered 2012-09-12 – 2012-09-13 (×2): 5 mg via ORAL
  Filled 2012-09-12 (×2): qty 1

## 2012-09-12 MED ORDER — SODIUM CHLORIDE 0.9 % IV SOLN
INTRAVENOUS | Status: DC
  Administered 2012-09-12: 20:00:00 via INTRAVENOUS

## 2012-09-12 NOTE — Progress Notes (Signed)
*  Preliminary Results* Left upper extremity venous duplex completed. Left upper extremity is positive for deep vein thrombosis involving the radial veins.  Preliminary results discussed with Dr.Sanger, Dr.Hernandez, and Shawn Rayburn, PA.  09/12/2012 5:21 PM Gertie Fey, RDMS, RDCS

## 2012-09-12 NOTE — H&P (Signed)
[facility] History and Physical  BERNARD DONAHOO ZOX:[id number] DOB: Sep 02, 1970 DOA: 09/12/2012  Referring physician: Dr. Kelly Splinter PCP: [name], MD  Specialists: Dr. Ronney Lion (oncologist)  Chief Complaint: LUE swelling and pain  HPI: Renee [name] is a 42 y.o. female with PMH for breast cancer (S/P resection,chemotherapy and radiation; now on remission and using tamoxifen) and hypothyroidism; came to the hospital secondary to LEFT upper extremity swelling and pain; found to have positive radial deep vein thrombosis at her plastic surgeon office and was referred to the hospital for further evaluation and treatment. Patient denies CP, SOB, nausea, vomting, abdominal pain, fever, chills, melena or any other acute complaints.   Review of Systems:  Negative except as mentioned on HPI.   Past Medical History  Diagnosis Date  . Breast cancer     2010 ; left side mastectomy chemo pill  . Thyroid disease   . Hypothyroidism    Past Surgical History  Procedure Date  . Mastectomy     left side  . Portacath placement   . Port-a-cath removal   . Breast reconstruction 01/2011   Social History:  reports that she quit smoking about 1 years ago. She has never used smokeless tobacco. She reports that she does not drink alcohol or use illicit drugs. Lives at home, no assistance to perform ADL's   No Known Allergies  Family History  Problem Relation Age of Onset  . Hypertension Mother   . Diabetes Father   . Stroke Father   . Hypertension Father   . Hypertension Maternal Grandmother   . Diabetes Paternal Grandmother   . Cancer Paternal Grandfather     Prior to Admission medications   Medication Sig Start Date End Date Taking? Authorizing Provider  ascorbic acid (VITAMIN C) 1000 MG tablet Take 1,000 mg by mouth every morning.    Historical Provider, MD  Multiple Vitamins-Minerals (MULTIVITAMINS THER. W/MINERALS) TABS Take 1 tablet by mouth daily.      Historical  Provider, MD  SYNTHROID 112 MCG tablet Take 1 tablet (112 mcg total) by mouth daily. 05/30/12   Gordy Savers, MD  tamoxifen (NOLVADEX) 10 MG tablet Take 2 tablets (20 mg total) by mouth daily. 07/31/12   Randall An, MD  vitamin B-12 (CYANOCOBALAMIN) 1000 MCG tablet Take 1,000 mcg by mouth every morning.     Historical Provider, MD   Physical Exam: Filed Vitals:   09/12/12 1815  BP: 142/81  Pulse: 60  Temp: 97.9 F (36.6 C)  Resp: 18  SpO2: 100%     General:  NAD, afebrile, cooperative to examination  Eyes: PERRL, no icterus, no nystagmus; EOMI  ENT: no erythema or exudates, MMM, good dentition  Neck: supple, no bruits, no JVD  Cardiovascular: no rubs, no gallops, no murmurs, S1 and S2  Respiratory: CTA  Abdomen: soft, NT, ND, positive BS  Skin: no rashes or petechiae  Musculoskeletal: FROM, no joint swelling or erythema; LUE mild to moderate swollen affecting area from elbow to shoulder. Also tender on palpation; rest of extremities intact and exam WNL  Psychiatric: appropriate, no hallucinations, agitation or SI  Neurologic: CN intact, AAOX#, no focal sensory or motor deficit   Radiological Exams on Admission: Left upper extremity is positive for deep vein thrombosis involving the radial veins.    Assessment/Plan 1-DVT of upper extremity (deep vein thrombosis): most likely secondary to decrease flow in her left arm vein/lymph system; due to breast resection and radiation for breast cancer. She is  now on remission and using tamoxifen. Which can also predispose her to clots. -she reports that would no use any injections and had heard bad stories from coumadin (and difficulty dosing) -will start treatment on xarelto; patient will follow with oncologist for further long term treatment. (this needs to be treated at least for 6 months) -will check kidney function, Hgb level and FOBT -no CP, no SOB, no tachycardia (making virtually impossible any PE) -PRN  analgesics and supportive care.  2-ADENOCARCINOMA, LEFT BREAST: Hold tamoxifen and follow up with primary oncologist for long term decision. Acute case discussed with Dr. Arline Asp; who recommended to hold tamoxifen for now.  3-HYPOTHYROIDISM: check TSH; continue synthroid   Code Status: Full Family Communication: whole family at bedside (mother, father, Steffanie Rainwater and daughter) Disposition Plan: Patient admitted on observation for evaluation and initiation of treatment for Upper extremity DVT left Side. Patient had hx of breast cancer status post surgery, chemotherapy and radiation. LOS > 2 midnights  Time spent: >30 minutes  Odie Rauen [facility] Pager [phone]  If 7PM-7AM, please contact night-coverage [email or web address] Password [facility] 09/12/2012, 6:53 PM

## 2012-09-13 ENCOUNTER — Other Ambulatory Visit (HOSPITAL_COMMUNITY): Payer: Self-pay | Admitting: Oncology

## 2012-09-13 LAB — CBC
Hemoglobin: 12 g/dL (ref 12.0–15.0)
MCH: 33.1 pg (ref 26.0–34.0)
MCHC: 33 g/dL (ref 30.0–36.0)
RDW: 13.7 % (ref 11.5–15.5)

## 2012-09-13 LAB — BASIC METABOLIC PANEL
BUN: 15 mg/dL (ref 6–23)
Calcium: 8.8 mg/dL (ref 8.4–10.5)
GFR calc Af Amer: >90 mL/min (ref >90)
GFR calc non Af Amer: >90 mL/min (ref >90)
Glucose, Bld: 92 mg/dL (ref 70–99)
Sodium: 141 mEq/L (ref 135–145)

## 2012-09-13 MED ORDER — RIVAROXABAN 20 MG PO TABS: 20.0000 mg | ORAL_TABLET | Freq: Every day | ORAL | Status: DC

## 2012-09-13 MED ORDER — ACETAMINOPHEN 325 MG PO TABS: 650.0000 mg | ORAL_TABLET | Freq: Four times a day (QID) | ORAL | Status: DC | PRN

## 2012-09-13 MED ORDER — RIVAROXABAN 15 MG PO TABS: 15.0000 mg | ORAL_TABLET | Freq: Two times a day (BID) | ORAL | Status: DC

## 2012-09-13 NOTE — Discharge Summary (Signed)
Physician Discharge Summary  [name]:[id number] DOB: Jul 10, 1971 DOA: 09/12/2012  PCP: [name], MD Specialists: Dr. Ronney Lion (oncologist)  Admit date: 09/12/2012 Discharge date: 09/13/2012  Recommendations for Outpatient Follow-up:  1. Followup resolution of left upper extremity DVT  2. Followup mild thrombocytopenia, consider repeat CBC within 7 days  Follow-up Information    Follow up with [name], MD. (As needed)    Contact information:   [address] [place]ake [place] [address] [phone]       Schedule an appointment as soon as possible for a visit with Randall An, MD. (2/7 for CBC/blood work)    Solicitor information:   [address] [place] [address] [phone]         Discharge Diagnoses:  1. Acute left upper extremity DVT 2. History of breast cancer, on tamoxifen  Discharge Condition: Improved Disposition: Home  Diet recommendation: Regular  Filed Weights   09/12/12 2027  Weight: 60.782 kg (134 lb)    History of present illness:  42 y.o. female with PMH for breast cancer (S/P resection,chemotherapy and radiation; now on remission and using tamoxifen) and hypothyroidism; came to the hospital secondary to LEFT upper extremity swelling and pain; found to have positive radial deep vein thrombosis at her plastic surgeon office and was referred to the hospital for further evaluation and treatment. Patient denies CP, SOB, nausea, vomting, abdominal pain, fever, chills, melena or any other acute complaints.  Hospital Course:  [name] was admitted for further treatment of left upper extremity DVT. She started on Xarelto (did not want to take Coumadin). Repeat lab work in the morning showed mild thrombocytopenia, likely insignificant. Symptoms present for approximately 2 weeks, given chronicity and lack of more proximal DVT, no indication for thrombolysis. The common side effects and adverse reactions of the new  medication were discussed with the patient including bleeding. She understands this is a relatively new medication but would like to take this medication rather than Coumadin. Tamoxifen on hold until followup as an outpatient. 1. Left upper extremity DVT: most likely secondary to decrease flow in her left arm vein/lymph system; due to breast resection and radiation for breast cancer, possibly complicated by tamoxifen. No signs or symptoms to suggest clot propagation or pulmonary embolism.  2. History of breast cancer with left-sided mastectomy 2010: Hold tamoxifen per oncology, followup with primary oncologist for long term decision.  3. Hypothyroidism: Stable. TSH within normal limits. Continue levothyroxine.  4. Postmenopausal: Last menses greater than 7 years ago. Went through menopause early.  Procedures: Left upper extremity venous duplex: Findings consistent with acute deep vein thrombosis involving the left radial vein.  Discharge Instructions  Discharge Orders    Future Appointments: Provider: Department: Dept Phone: Center:   01/29/2013 1:30 PM Randall An, MD [facility] [phone] None     Future Orders Please Complete By Expires   Diet general      Activity as tolerated - No restrictions      Discharge instructions      Comments:   You have been started on Xarelto for a blood clot in your left arm. Notify your physician or seek immediate medical attention for bleeding, increased pain in your arm, chest pain, shortness of breath or worsening of your condition. You may resume normal activities, avoid new exercise program, strenuous lifting/activity until cleared by your physician.       Medication List     As of 09/13/2012 10:29 AM  STOP taking these medications         ibuprofen 200 MG tablet   Commonly known as: ADVIL,MOTRIN      tamoxifen 10 MG tablet   Commonly known as: NOLVADEX      TAKE these medications         acetaminophen 325 MG tablet    Commonly known as: TYLENOL   Take 2 tablets (650 mg total) by mouth every 6 (six) hours as needed for pain.      ascorbic acid 1000 MG tablet   Commonly known as: VITAMIN C   Take 1,000 mg by mouth every morning.      multivitamins ther. w/minerals Tabs   Take 1 tablet by mouth daily.      Rivaroxaban 15 MG Tabs tablet   Commonly known as: XARELTO   Take 1 tablet (15 mg total) by mouth 2 (two) times daily. Start 2/5 in the evening. Note: This medication is dosed at 15 mg twice a day for the first 21 days, then the doses changes to 20 mg daily.      Rivaroxaban 20 MG Tabs   Commonly known as: XARELTO   Take 1 tablet (20 mg total) by mouth daily. Start February 25 in the evening (after completing 3 weeks of 15 mg twice daily).      SYNTHROID 112 MCG tablet   Generic drug: levothyroxine   Take 1 tablet (112 mcg total) by mouth daily.      vitamin B-12 1000 MCG tablet   Commonly known as: CYANOCOBALAMIN   Take 1,000 mcg by mouth every morning.        The results of significant diagnostics from this hospitalization (including imaging, microbiology, ancillary and laboratory) are listed below for reference.      Labs: Basic Metabolic Panel:  Lab 09/13/12 7829 09/12/12 2028  NA 141 139  K 3.9 3.4*  CL 106 103  CO2 26 24  GLUCOSE 92 156*  BUN 15 13  CREATININE 0.76 0.67  CALCIUM 8.8 9.1  MG -- 1.8  PHOS -- 3.5   Liver Function Tests:  Lab 09/12/12 2028  AST 19  ALT 10  ALKPHOS 56  BILITOT 0.3  PROT 6.7  ALBUMIN 3.6   CBC:  Lab 09/13/12 0600 09/12/12 2028  WBC 6.2 8.2  NEUTROABS -- --  HGB 12.0 12.8  HCT 36.4 37.1  MCV 100.3* 99.5  PLT 147* 168    Principal Problem:  *DVT of upper extremity (deep vein thrombosis) Active Problems:  ADENOCARCINOMA, LEFT BREAST  HYPOTHYROIDISM   Time coordinating discharge: 35 minutes  Signed:  Brendia Sacks, MD [facility] 09/13/2012, 10:29 AM

## 2012-09-13 NOTE — Progress Notes (Signed)
[facility] PROGRESS NOTE  Renee [name] QMV:[id number] DOB: Mar 20, 1971 DOA: 09/12/2012 PCP: [name], MD Specialists: Dr. Ronney Lion (oncologist)  Assessment/Plan: 1. Left upper extremity DVT: most likely secondary to decrease flow in her left arm vein/lymph system; due to breast resection and radiation for breast cancer, possibly complicated by tamoxifen. No signs or symptoms to suggest clot propagation or pulmonary embolism. 2. History of breast cancer with left-sided mastectomy 2010: Hold tamoxifen per oncology, followup with primary oncologist for long term decision. 3. Hypothyroidism: Stable. TSH within normal limits. Continue levothyroxine. 4. Postmenopausal: Last menses greater than 7 years ago. Went through menopause early.  Code Status: Full code Family Communication: Discussed with family at bedside Disposition Plan: Home  Brendia Sacks, MD  [facility] Team 5 Pager [phone] If 7PM-7AM, please contact night-coverage at [email or web address], password [facility] 09/13/2012, 9:35 AM  LOS: 1 day   Brief narrative: 42 y.o. female with PMH for breast cancer (S/P resection,chemotherapy and radiation; now on remission and using tamoxifen) and hypothyroidism; came to the hospital secondary to LEFT upper extremity swelling and pain; found to have positive radial deep vein thrombosis at her plastic surgeon office and was referred to the hospital for further evaluation and treatment. Patient denies CP, SOB, nausea, vomting, abdominal pain, fever, chills, melena or any other acute complaints.  Procedures: Left upper extremity venous duplex: Findings consistent with acute deep vein thrombosis involving the left radial vein.   HPI/Subjective: Feels about the same. No chest pain or shortness of breath. Still has left arm pain and swelling.  Objective: Filed Vitals:   09/12/12 1815 09/12/12 2027 09/12/12 2134 09/13/12 0524  BP: 142/81  123/72 110/57  Pulse: 60  65 70   Temp: 97.9 F (36.6 C)  98.2 F (36.8 C) 98 F (36.7 C)  TempSrc:   Oral   Resp: 18  17 17   Height:  5' (1.524 m)    Weight:  60.782 kg (134 lb)    SpO2: 100%  98% 100%    Intake/Output Summary (Last 24 hours) at 09/13/12 0935 Last data filed at 09/13/12 0523  Gross per 24 hour  Intake 493.17 ml  Output      0 ml  Net 493.17 ml   Filed Weights   09/12/12 2027  Weight: 60.782 kg (134 lb)    Exam:  General:  Appears calm and comfortable Cardiovascular: RRR, no m/r/g. No LE edema. Respiratory: CTA bilaterally, no w/r/r. Normal respiratory effort. Musculoskeletal: Mild edema left upper extremity, ecchymosis lateral epicondyle, tender forearm, no overlying erythema or evidence of infection. Psychiatric: grossly normal mood and affect, speech fluent and appropriate  Data Reviewed: Basic Metabolic Panel:  Lab 09/13/12 4010 09/12/12 2028  NA 141 139  K 3.9 3.4*  CL 106 103  CO2 26 24  GLUCOSE 92 156*  BUN 15 13  CREATININE 0.76 0.67  CALCIUM 8.8 9.1  MG -- 1.8  PHOS -- 3.5   Liver Function Tests:  Lab 09/12/12 2028  AST 19  ALT 10  ALKPHOS 56  BILITOT 0.3  PROT 6.7  ALBUMIN 3.6   CBC:  Lab 09/13/12 0600 09/12/12 2028  WBC 6.2 8.2  NEUTROABS -- --  HGB 12.0 12.8  HCT 36.4 37.1  MCV 100.3* 99.5  PLT 147* 168    Studies: No results found.  Scheduled Meds:   . levothyroxine  112 mcg Oral QAC breakfast  . multivitamin with minerals  1 tablet Oral Daily  . rivaroxaban  15 mg Oral BID  .  vitamin B-12  1,000 mcg Oral q morning - 10a   Continuous Infusions:   . sodium chloride 50 mL/hr at 09/12/12 2028    Principal Problem:  *DVT of upper extremity (deep vein thrombosis) Active Problems:  ADENOCARCINOMA, LEFT BREAST  HYPOTHYROIDISM     Brendia Sacks, MD  [facility] Team 5 Pager [phone] If 7PM-7AM, please contact night-coverage at [email or web address], password [facility] 09/13/2012, 9:35 AM  LOS: 1 day

## 2012-09-18 ENCOUNTER — Encounter (HOSPITAL_COMMUNITY): Payer: Medicaid Other | Attending: Oncology | Admitting: Oncology

## 2012-09-18 ENCOUNTER — Encounter (HOSPITAL_COMMUNITY): Payer: Self-pay | Admitting: Oncology

## 2012-09-18 VITALS — BP 113/69 | HR 78 | Temp 98.1°F | Resp 16 | Wt 134.8 lb

## 2012-09-18 DIAGNOSIS — G609 Hereditary and idiopathic neuropathy, unspecified: Secondary | ICD-10-CM

## 2012-09-18 DIAGNOSIS — I82629 Acute embolism and thrombosis of deep veins of unspecified upper extremity: Secondary | ICD-10-CM

## 2012-09-18 DIAGNOSIS — C50919 Malignant neoplasm of unspecified site of unspecified female breast: Secondary | ICD-10-CM

## 2012-09-18 DIAGNOSIS — C773 Secondary and unspecified malignant neoplasm of axilla and upper limb lymph nodes: Secondary | ICD-10-CM

## 2012-09-18 DIAGNOSIS — W19XXXA Unspecified fall, initial encounter: Secondary | ICD-10-CM | POA: Insufficient documentation

## 2012-09-18 DIAGNOSIS — C50419 Malignant neoplasm of upper-outer quadrant of unspecified female breast: Secondary | ICD-10-CM

## 2012-09-18 LAB — COMPREHENSIVE METABOLIC PANEL
ALT: 12 U/L (ref 0–35)
Calcium: 9.6 mg/dL (ref 8.4–10.5)
Creatinine, Ser: 0.74 mg/dL (ref 0.50–1.10)
GFR calc Af Amer: >90 mL/min (ref >90)
Glucose, Bld: 90 mg/dL (ref 70–99)
Sodium: 140 mEq/L (ref 135–145)
Total Protein: 7 g/dL (ref 6.0–8.3)

## 2012-09-18 LAB — CBC WITH DIFFERENTIAL/PLATELET
Basophils Absolute: 0 10*3/uL (ref 0.0–0.1)
Eosinophils Absolute: 0.1 10*3/uL (ref 0.0–0.7)
Eosinophils Relative: 1 % (ref 0–5)
Lymphs Abs: 2.8 10*3/uL (ref 0.7–4.0)
MCH: 34 pg (ref 26.0–34.0)
MCV: 99.2 fL (ref 78.0–100.0)
Platelets: 166 10*3/uL (ref 150–400)
RDW: 13.4 % (ref 11.5–15.5)

## 2012-09-18 NOTE — Progress Notes (Signed)
Renee [name] presented for labwork. Labs per MD order drawn via Peripheral Line 21 gauge needle inserted in right AC  Good blood return present. Procedure without incident.  Needle removed intact. Patient tolerated procedure well.

## 2012-09-18 NOTE — Patient Instructions (Addendum)
[facility] Discharge Instructions  RECOMMENDATIONS MADE BY THE CONSULTANT AND ANY TEST RESULTS WILL BE SENT TO YOUR REFERRING PHYSICIAN.  EXAM FINDINGS BY THE PHYSICIAN TODAY AND SIGNS OR SYMPTOMS TO REPORT TO CLINIC OR PRIMARY PHYSICIAN: Exam and discussion by PA.  MD wants you to restart Tamoxifen.  MEDICATIONS PRESCRIBED:  Restart Tamoxifen  INSTRUCTIONS GIVEN AND DISCUSSED: Report any new lumps, bone pain, etc.  SPECIAL INSTRUCTIONS/FOLLOW-UP: Keep scheduled appointment.  Thank you for choosing [name] to provide your oncology and hematology care.  To afford each patient quality time with our providers, please arrive at least 15 minutes before your scheduled appointment time.  With your help, our goal is to use those 15 minutes to complete the necessary work-up to ensure our physicians have the information they need to help with your evaluation and healthcare recommendations.    Effective January 1st, 2014, we ask that you re-schedule your appointment with our physicians should you arrive 10 or more minutes late for your appointment.  We strive to give you quality time with our providers, and arriving late affects you and other patients whose appointments are after yours.    Again, thank you for choosing [facility].  Our hope is that these requests will decrease the amount of time that you wait before being seen by our physicians.       _____________________________________________________________  Should you have questions after your visit to [facility], please contact our office at [phone] between the hours of 8:30 a.m. and 5:00 p.m.  Voicemails left after 4:30 p.m. will not be returned until the following business day.  For prescription refill requests, have your pharmacy contact our office with your prescription refill request.     Deep Vein Thrombosis A deep vein thrombosis (DVT) is a blood clot that  develops in a deep vein. A DVT is a clot in the deep, larger veins of the leg, arm, or pelvis. These are more dangerous than clots that might form in veins near the surface of the body. A DVT can lead to complications if the clot breaks off and travels in the bloodstream to the lungs.  A DVT can damage the valves in your leg veins, so that instead of flowing upwards, the blood pools in the lower leg. This is called post-thrombotic syndrome, and can result in pain, swelling, discoloration, and sores on the leg. Once identified, a DVT can be treated. It can also be prevented in some circumstances. Once you have had a DVT, you may be at increased risk for a DVT in the future. CAUSES Blood clots form in a vein for different reasons. Usually several things contribute to blood clots. Contributing factors include:  The flow of blood slows down.  The inside of the vein is damaged in some way.  The person has a condition that makes blood clot more easily. Some people are more likely than others to develop blood clots. That is because they have more factors that make clots likely. These are called risk factors. Risk factors include:   Older age, especially over 49 years old.  Having a history of blood clots. This means you have had one before. Or, it means that someone else in your family has had blood clots. You may have a genetic tendency to form clots.  Having major or lengthy surgery. This is especially true for surgery on the hip, knee, or belly (abdomen). Hip surgery is particularly high  risk.  Breaking a hip or leg.  Sitting or lying still for a long time. This includes long distance travel, paralysis, or recovery from an illness or surgery.  Cancer, or cancer treatment.  Having a long, thin tube (catheter) placed inside a vein during a medical procedure.  Being overweight (obese).  Pregnancy and childbirth. Hormone changes make the blood clot more easily during pregnancy. The fetus puts  pressure on the veins of the pelvis. There is also risk of injury to veins during delivery or a caesarean. The risk is at its highest just after childbirth.  Medicines with the female hormone estrogen. This includes birth control pills and hormone replacement therapy.  Smoking.  Other circulation or heart problems. SYMPTOMS When a clot forms, it can either partially or totally block the blood flow in that vein. Symptoms of a DVT can include:  Swelling of the leg or arm, especially if one side is much worse.  Warmth and redness of the leg or arm, especially if one side is much worse.  Pain in an arm or leg. If the clot is in the leg, symptoms may be more noticeable or worse when standing or walking. The symptoms of a DVT that has traveled to the lungs (pulmonary embolism, PE) usually start suddenly, and include:  Shortness of breath.  Coughing.  Coughing up blood or blood-tinged phlegm.  Chest pain. The chest pain is often worse with deep breaths.  Rapid heartbeat. Anyone with these symptoms should get emergency medical treatment right away. Call your local emergency services 911 in [place] if you have these symptoms. DIAGNOSIS If a DVT is suspected, your caregiver will take a full medical history and carry out a physical exam. Tests that also may be required include:  Blood tests, including studies of the clotting properties of the blood.  Ultrasonography to see if you have clots in your legs or lungs.  X-rays to show the flow of blood when dye is injected into the veins (venography).  Studies of your lungs, if you have any chest symptoms. PREVENTION  Exercise the legs regularly. Take a brisk 30 minute walk every day.  Maintain a weight that is appropriate for your height.  Avoid sitting or lying in bed for long periods of time without moving your legs.  Women, particularly those over the age of 80, should consider the risks and benefits of taking estrogen medicines,  including birth control pills.  Do not smoke, especially if you take estrogen medicines.  Long distance travel can increase your risk of DVT. You should exercise your legs by walking or pumping the muscles every hour.  In-hospital prevention:  Many of the risk factors above relate to situations that exist with hospitalization, either for illness, injury, or elective surgery.  Your caregiver will assess you for the need for venous thromboembolism prophylaxis when you are admitted to the hospital. If you are having surgery, your surgeon will assess you the day of or day after surgery.  Prevention may include medical and nonmedical measures. TREATMENT Treatment for DVT helps prevent death and disability. The most common treatment for DVT is blood thinning (anticoagulant) medicine, which reduces the blood's tendency to clot. Anticoagulants can stop new blood clots from forming and old ones from growing. They cannot dissolve existing clots. Your body does this by itself over time. Anticoagulants can be given by mouth, by intravenous (IV) access, or by injection. Your caregiver will determine the best program for you.  Heparin or related medicines (low  molecular weight heparin) are usually the first treatment for a blood clot. They act quickly. However, they cannot be taken orally.  Heparin can cause a fall in a component of blood that stops bleeding and forms blood clots (platelets). You will be monitored with blood tests to be sure this does not occur.  Warfarin is an anticoagulant that can be swallowed (taken orally). It takes a few days to start working, so usually heparin or related medicines are used in combination. Once warfarin is working, heparin is usually stopped.  Less commonly, clot dissolving drugs (thrombolytics) are used to dissolve a DVT. They carry a high risk of bleeding, so they are used mainly in severe cases, where a life or limb is threatened.  Very rarely, a blood clot in  the leg needs to be removed surgically.  If you are unable to take anticoagulants, your caregiver may arrange for you to have a filter placed in a main vein in your belly (abdomen). This filter prevents clots from traveling to your lungs. HOME CARE INSTRUCTIONS  Take all medicines prescribed by your caregiver. Follow the directions carefully.  Warfarin. Most people will continue taking warfarin after hospital discharge. Your caregiver will advise you on the length of treatment (usually 3 6 months, sometimes lifelong).  Too much and too little warfarin are both dangerous. Too much warfarin increases the risk of bleeding. Too little warfarin continues to allow the risk for blood clots. While taking warfarin, you will need to have regular blood tests to measure your blood clotting time. These blood tests usually include both the prothrombin time (PT) and international normalized ratio (INR) tests. The PT and INR results allow your caregiver to adjust your dose of warfarin. The dose can change for many reasons. It is critically important that you take warfarin exactly as prescribed, and that you have your PT and INR levels drawn exactly as directed.  Many foods, especially foods high in vitamin K can interfere with warfarin and affect the PT and INR results. Foods high in vitamin K include spinach, kale, broccoli, cabbage, collard and turnip greens, brussels sprouts, peas, cauliflower, seaweed, and parsley as well as beef and pork liver, green tea, and soybean oil. You should eat a consistent amount of foods high in vitamin K. Avoid major changes in your diet, or notify your caregiver before changing your diet. Arrange a visit with a dietitian to answer your questions.  Many medicines can interfere with warfarin and affect the PT and INR results. You must tell your caregiver about any and all medicines you take, this includes all vitamins and supplements. Be especially cautious with aspirin and  anti-inflammatory medicines. Ask your caregiver before taking these. Do not take or discontinue any prescribed or over-the-counter medicine except on the advice of your caregiver or pharmacist.  Warfarin can have side effects, primarily excessive bruising or bleeding. You will need to hold pressure over cuts for longer than usual. Your caregiver or pharmacist will discuss other potential side effects.  Alcohol can change the body's ability to handle warfarin. It is best to avoid alcoholic drinks or consume only very small amounts while taking warfarin. Notify your caregiver if you change your alcohol intake.  Notify your dentist or other caregivers before procedures.  Activity. Ask your caregiver how soon you can go back to normal activities. It is important to stay active to prevent blood clots. If you are on anticoagulant medicine, avoid contact sports.  Exercise. It is very important to exercise. This  is especially important while traveling, sitting or standing for long periods of time. Exercise your legs by walking or by pumping the muscles frequently. Take frequent walks.  Compression stockings. These are tight elastic stockings that apply pressure to the lower legs. This pressure can help keep the blood in the legs from clotting. You may need to wear compressions stockings at home to help prevent a DVT.  Smoking. If you smoke, quit. Ask your caregiver for help with quitting smoking.  Learn as much as you can about DVT. Knowing more about the condition should help you keep it from coming back.  Wear a medical alert bracelet or carry a medical alert card. SEEK MEDICAL CARE IF:  You notice a rapid heartbeat.  You feel weaker or more tired than usual.  You feel faint.  You notice increased bruising.  You feel your symptoms are not getting better in the time expected.  You believe you are having side effects of medicine. SEEK IMMEDIATE MEDICAL CARE IF:  You have chest pain.  You  have trouble breathing.  You have new or increased swelling or pain in one leg.  You cough up blood.  You notice blood in vomit, in a bowel movement, or in urine. MAKE SURE YOU:  Understand these instructions.  Will watch your condition.  Will get help right away if you are not doing well or get worse. Document Released: 07/26/2005 Document Revised: 01/25/2012 Document Reviewed: 09/17/2010 [employer] Patient Information 2013 [place], [place].

## 2012-09-18 NOTE — Progress Notes (Signed)
Renee Boga, MD [address] [place] [address]  1. 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 DVT of upper extremity (deep vein thrombosis)     CURRENT THERAPY: On Xarelto 15 mg BID x 21 days and then 20 mg daily.  Tamoxifen on hold secondary to DVT.  INTERVAL HISTORY: Renee [name] 42 y.o. 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 Estrogen receptors 96%, progesterone receptors 11%, Ki-67 marker 20%, HER-2/neu not amplified. She is status post dose dense epirubicin and Cytoxan for 4 cycles followed by weekly Taxol for 12 weeks. She then had radiation therapy by Dr. Mitzi Hansen.  She then started tamoxifen on 06/23/2010. She will probably take this for 10 years. Date of surgery was 10/08/2009. She is undergoing left breast reconstruction with Dr. Kelly Splinter. She was diagnosed with a left UE DVT while taking Tamoxifen on 09/12/2012.  She is to restart Tamoxifen within the week.  She is well covered with Xarelto.  She is accompanied by her boyfriend [place].  Recently, Brexlee was diagnosed with a left UE DVT.  No initiating factors can be identified except for her Tamoxifen usage and of course, her history of breast cancer.  She was started on Xarelto 15 mg BID and she will take that for 21 days.  She will then transition to 20 mg daily of Xarelto.    Since her DVT diagnosis, she was asked to hold Tamoxifen.  Today, she should restart the Tamoxifen.  We spent some time discussing causes of DVTs including Tamoxifen, Hypocoagulable states, and malignancy, just to name a few.    In light of her Stage III Breast cancer, we must evaluate her for malignancy if hypercoagulable panel is negative.  This will be performed by PET scan, but we will get a CA 27.29 marker  today.  She continues to have some left UE pain associated with the dvt.  She is tender to the touch on palpation of her left UE proximally and on the anterior side.    She also notes some occasional left LE discomfort in her calf.  She denies any claudication.  She denies any pain with foot dorsiflexion.   Past Medical History  Diagnosis Date  . Breast cancer     2010 ; left side mastectomy chemo pill  . Thyroid disease   . Hypothyroidism   . 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     has No Known Allergies.  [name] had no medications administered during this visit.  Past Surgical History  Procedure Laterality Date  . Mastectomy      left side  . Portacath placement    . Port-a-cath removal    . Breast reconstruction  01/2011    Denies any headaches, dizziness, double vision, fevers, chills, night sweats, nausea, vomiting, diarrhea, constipation, chest pain, heart palpitations, shortness of breath, blood in stool, black tarry stool, urinary pain, urinary burning, urinary frequency, hematuria.   PHYSICAL EXAMINATION  ECOG PERFORMANCE STATUS: 1 - Symptomatic but completely  ambulatory  Filed Vitals:   09/18/12 1327  BP: 113/69  Pulse: 78  Temp: 98.1 F (36.7 C)  Resp: 16    GENERAL:alert, healthy, well nourished, well developed, comfortable, cooperative and smiling SKIN: skin color, texture, turgor are normal, no rashes or significant lesions HEAD: Normocephalic, No masses, lesions, tenderness or abnormalities EYES: normal, Conjunctiva are pink and non-injected EARS: External ears normal OROPHARYNX:mucous membranes are moist  NECK: supple, no adenopathy,  thyroid normal size, non-tender, without nodularity, no stridor, non-tender, trachea midline LYMPH:  no palpable lymphadenopathy BREAST:not examined LUNGS: clear to auscultation and percussion HEART: regular rate & rhythm, no murmurs, no gallops, S1 normal and S2 normal ABDOMEN:abdomen soft, non-tender and normal bowel sounds BACK: Back symmetric, no curvature. EXTREMITIES:less then 2 second capillary refill, no joint deformities, effusion, or inflammation, no edema, no skin discoloration, no clubbing, no cyanosis, positive findings:  Left LE heat of calf.  No erythema or pain noted today.  Dorsiflexion of foot does not elicit pain.  No edema noted. Left UE is tender to palpation proximally on the anterior side especially proximal to the olecranon.  NEURO: alert & oriented x 3 with fluent speech, no focal motor/sensory deficits, gait normal    LABORATORY DATA: CBC    Component Value Date/Time   WBC 6.2 09/13/2012 0600   RBC 3.63* 09/13/2012 0600   HGB 12.0 09/13/2012 0600   HCT 36.4 09/13/2012 0600   PLT 147* 09/13/2012 0600   MCV 100.3* 09/13/2012 0600   MCH 33.1 09/13/2012 0600   MCHC 33.0 09/13/2012 0600   RDW 13.7 09/13/2012 0600   LYMPHSABS 2.8 07/31/2012 1611   MONOABS 0.6 07/31/2012 1611   EOSABS 0.1 07/31/2012 1611   BASOSABS 0.0 07/31/2012 1611        ASSESSMENT:  1. 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 Estrogen receptors 96%, progesterone receptors 11%, Ki-67 marker 20%, HER-2/neu not amplified. She is status post dose dense epirubicin and Cytoxan for 4 cycles followed by weekly Taxol for 12 weeks. She then had radiation therapy by Dr. Mitzi Hansen. She then started tamoxifen on 06/23/2010. She will probably take this for 10 years. Date of surgery was 10/08/2009. She is undergoing left breast reconstruction with Dr.  Kelly Splinter. She was diagnosed with a left UE DVT while taking Tamoxifen on 09/12/12.  She is to restart Tamoxifen within the week.  She is well covered with Xarelto. 2. 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 She took herself off the seizure medication within the last 12 years.  3. Syncopal episode in the past with facial trauma, right shoulder trauma, that was mild but she does have abrasions on her face and shoulder.   4. History of smoking though she has quit. She smoked approximately a pack a day for 14-15 years.  5. 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 Use of the sun tanning bed in the past and we have discouraged that hopefully 7. Left UE DVT   PLAN:  1. I personally reviewed and went over laboratory results with the patient. 2. Lab work today: CBC diff, CMET, CA 27.29, Hypercoag panel. 3. Patient education regarding DVT. 4. Continue Xarelto as prescribed 5. Restart Tamoxifen daily  within the next week. 6. Pending results from labs today, will pursue PET scan. 7. Return in 2-3 weeks for follow-up.     All questions were answered. The patient knows to call the clinic with any problems, questions or concerns. We can certainly see the patient much sooner if necessary.  The patient and plan discussed with Glenford Peers, MD and he is in agreement with the aforementioned.  Patient seen and examined by Dr. Mariel Sleet as well.   KEFALAS,THOMAS

## 2012-09-19 LAB — LUPUS ANTICOAGULANT PANEL
Lupus Anticoagulant: NOT DETECTED
PTT Lupus Anticoagulant: 40.6 secs (ref 28.0–43.0)
dRVVT Incubated 1:1 Mix: 41.7 secs (ref <42.9)

## 2012-09-19 LAB — CANCER ANTIGEN 27.29: CA 27.29: 29 U/mL (ref 0–39)

## 2012-09-19 LAB — CARDIOLIPIN ANTIBODIES, IGG, IGM, IGA
Anticardiolipin IgA: 2 APL U/mL — ABNORMAL LOW (ref <22)
Anticardiolipin IgM: 0 MPL U/mL — ABNORMAL LOW (ref <11)

## 2012-09-19 LAB — ANTITHROMBIN III: AntiThromb III Func: 97 % (ref 75–120)

## 2012-09-19 LAB — BETA-2-GLYCOPROTEIN I ABS, IGG/M/A
Beta-2 Glyco I IgG: 1 G Units (ref <20)
Beta-2-Glycoprotein I IgM: 2 M Units (ref <20)

## 2012-09-19 LAB — PROTEIN C ACTIVITY: Protein C Activity: 165 % — ABNORMAL HIGH (ref 75–133)

## 2012-09-20 LAB — PROTEIN C, TOTAL: Protein C, Total: 88 % (ref 72–160)

## 2012-09-23 ENCOUNTER — Other Ambulatory Visit: Payer: Self-pay

## 2012-09-26 ENCOUNTER — Other Ambulatory Visit (HOSPITAL_COMMUNITY): Payer: Self-pay | Admitting: Oncology

## 2012-09-26 ENCOUNTER — Encounter (HOSPITAL_BASED_OUTPATIENT_CLINIC_OR_DEPARTMENT_OTHER): Payer: Medicaid Other | Admitting: Oncology

## 2012-09-26 ENCOUNTER — Ambulatory Visit (HOSPITAL_COMMUNITY)
Admission: RE | Admit: 2012-09-26 | Discharge: 2012-09-26 | Disposition: A | Discharge status: Home / Self Care | Payer: Medicaid Other | Source: Ambulatory Visit | Attending: Oncology | Admitting: Oncology

## 2012-09-26 ENCOUNTER — Telehealth (HOSPITAL_COMMUNITY): Payer: Self-pay | Admitting: *Deleted

## 2012-09-26 VITALS — BP 113/72 | HR 66 | Temp 97.9°F | Resp 16

## 2012-09-26 DIAGNOSIS — I82629 Acute embolism and thrombosis of deep veins of unspecified upper extremity: Secondary | ICD-10-CM

## 2012-09-26 DIAGNOSIS — M79606 Pain in leg, unspecified: Secondary | ICD-10-CM

## 2012-09-26 DIAGNOSIS — R599 Enlarged lymph nodes, unspecified: Secondary | ICD-10-CM | POA: Insufficient documentation

## 2012-09-26 DIAGNOSIS — I809 Phlebitis and thrombophlebitis of unspecified site: Secondary | ICD-10-CM

## 2012-09-26 DIAGNOSIS — M79609 Pain in unspecified limb: Secondary | ICD-10-CM | POA: Insufficient documentation

## 2012-09-26 DIAGNOSIS — C50419 Malignant neoplasm of upper-outer quadrant of unspecified female breast: Secondary | ICD-10-CM

## 2012-09-26 NOTE — Progress Notes (Signed)
#  1 superficial phlebitis of the left forearm extending into the left antecubital fossa, ulnar aspect. We will stop the xarelto and start Aleve 2 in the morning, one at 2:00 in the afternoon, and 1 at bedtime with food. On today's exam she has tenderness along the very superficial vein extending from close to the wrist to the antecubital fossa on the ulnar side and slightly above. Her valves are palpable and tender. Because of worsening discomfort we did Dopplers again of the left arm upper and lower areas as well as of the left leg. None of these areas were found to have any deep vein involvement. I have just gone over this with 2 radiologists. Superficial phlebitis however would not be recognized on this test. Therefore I will proceed with therapy for superficial phlebitis for 2 weeks. If not better she will need a PET scan to make sure she does not have occult cancer recurrence. For the time she will continue to hold the tamoxifen. She will start this only if she is fine in 2 weeks when I see her back. She is fine with this plan.

## 2012-09-26 NOTE — Patient Instructions (Addendum)
[facility] Discharge Instructions  RECOMMENDATIONS MADE BY THE CONSULTANT AND ANY TEST RESULTS WILL BE SENT TO YOUR REFERRING PHYSICIAN.  EXAM FINDINGS BY THE PHYSICIAN TODAY AND SIGNS OR SYMPTOMS TO REPORT TO CLINIC OR PRIMARY PHYSICIAN: Exam and discussion by MD.  Ultrasound does not show any Deep Vein Thromboses in your arm or your legs.  Want you to stop the xarelto and do not take your tamoxifen.  MEDICATIONS PRESCRIBED:  Aleve take 2 pill twice daily with food for 10 days.  INSTRUCTIONS GIVEN AND DISCUSSED: Call us on Friday and let us know how you are doing. Tobie Lords, RN [phone])  SPECIAL INSTRUCTIONS/FOLLOW-UP: Return in 2 weeks for follow-up.  Thank you for choosing [facility] to provide your oncology and hematology care.  To afford each patient quality time with our providers, please arrive at least 15 minutes before your scheduled appointment time.  With your help, our goal is to use those 15 minutes to complete the necessary work-up to ensure our physicians have the information they need to help with your evaluation and healthcare recommendations.    Effective January 1st, 2014, we ask that you re-schedule your appointment with our physicians should you arrive 10 or more minutes late for your appointment.  We strive to give you quality time with our providers, and arriving late affects you and other patients whose appointments are after yours.    Again, thank you for choosing [facility].  Our hope is that these requests will decrease the amount of time that you wait before being seen by our physicians.       _____________________________________________________________  Should you have questions after your visit to [facility], please contact our office at [phone] between the hours of 8:30 a.m. and 5:00 p.m.  Voicemails left after 4:30 p.m. will not be returned until the following business day.  For  prescription refill requests, have your pharmacy contact our office with your prescription refill request.

## 2012-09-26 NOTE — Telephone Encounter (Signed)
Renee [name] called to report "knots" along left forearm up to antecubital. She will come in at 2pm today for an appt with Dr. Mariel Sleet

## 2012-09-29 ENCOUNTER — Telehealth (HOSPITAL_COMMUNITY): Payer: Self-pay

## 2012-09-29 ENCOUNTER — Other Ambulatory Visit (HOSPITAL_COMMUNITY): Payer: Self-pay

## 2012-09-29 MED ORDER — OMEPRAZOLE 20 MG PO CPDR: 20.0000 mg | DELAYED_RELEASE_CAPSULE | Freq: Two times a day (BID) | ORAL | Status: DC

## 2012-09-29 NOTE — Telephone Encounter (Signed)
Call from [place] stating that the advil has relieved her pain but it's causing her burning in her stomach.  Also that she has knots going further up her arm.  Discussed with Dr. Mariel Sleet and patient instructed to continue advil but to take prilosec 20 mg BID and that she can apply cold packs to the "knots" on her arm. Requested that RX be called into [facility] and this was done.  Verbalized understanding of instructions.  To let us know if she has further problems.

## 2012-10-09 ENCOUNTER — Encounter (HOSPITAL_COMMUNITY): Payer: Medicaid Other | Attending: Oncology | Admitting: Oncology

## 2012-10-09 VITALS — BP 110/63 | HR 78 | Temp 97.9°F | Resp 18 | Wt 134.4 lb

## 2012-10-09 DIAGNOSIS — G609 Hereditary and idiopathic neuropathy, unspecified: Secondary | ICD-10-CM | POA: Insufficient documentation

## 2012-10-09 DIAGNOSIS — F17200 Nicotine dependence, unspecified, uncomplicated: Secondary | ICD-10-CM

## 2012-10-09 DIAGNOSIS — I808 Phlebitis and thrombophlebitis of other sites: Secondary | ICD-10-CM

## 2012-10-09 DIAGNOSIS — Z09 Encounter for follow-up examination after completed treatment for conditions other than malignant neoplasm: Secondary | ICD-10-CM | POA: Insufficient documentation

## 2012-10-09 DIAGNOSIS — R55 Syncope and collapse: Secondary | ICD-10-CM | POA: Insufficient documentation

## 2012-10-09 DIAGNOSIS — Z853 Personal history of malignant neoplasm of breast: Secondary | ICD-10-CM | POA: Insufficient documentation

## 2012-10-09 DIAGNOSIS — W19XXXA Unspecified fall, initial encounter: Secondary | ICD-10-CM | POA: Insufficient documentation

## 2012-10-09 DIAGNOSIS — IMO0002 Reserved for concepts with insufficient information to code with codable children: Secondary | ICD-10-CM | POA: Insufficient documentation

## 2012-10-09 MED ORDER — VARENICLINE TARTRATE 0.5 MG PO TABS: 0.5000 mg | ORAL_TABLET | Freq: Two times a day (BID) | ORAL | Status: DC

## 2012-10-09 NOTE — Progress Notes (Signed)
Her left arm superficial phlebitis is much improved and she is presently asymptomatic. She has completed 2 full weeks of Aleve therapy consisting of one twice a day and 2 at night.  She will use cold compresses/wraps should this recur or get your dictated in the next week to 2 weeks. It's worse than that she will call me. Otherwise I will see her as scheduled in June of this year.  She is smoking once again and needs to quit. She would like to try Chantix which will be e-scribed.

## 2012-10-09 NOTE — Patient Instructions (Addendum)
[facility] Discharge Instructions  RECOMMENDATIONS MADE BY THE CONSULTANT AND ANY TEST RESULTS WILL BE SENT TO YOUR REFERRING PHYSICIAN.  EXAM FINDINGS BY THE PHYSICIAN TODAY AND SIGNS OR SYMPTOMS TO REPORT TO CLINIC OR PRIMARY PHYSICIAN: Exam and discussion by MD.  Stop the aleve and use cold packs to your arm twice daily as needed.  MEDICATIONS PRESCRIBED:  Chantix - take as directed.  INSTRUCTIONS GIVEN AND DISCUSSED: Keep same appointments.    Thank you for choosing [name] to provide your oncology and hematology care.  To afford each patient quality time with our providers, please arrive at least 15 minutes before your scheduled appointment time.  With your help, our goal is to use those 15 minutes to complete the necessary work-up to ensure our physicians have the information they need to help with your evaluation and healthcare recommendations.    Effective January 1st, 2014, we ask that you re-schedule your appointment with our physicians should you arrive 10 or more minutes late for your appointment.  We strive to give you quality time with our providers, and arriving late affects you and other patients whose appointments are after yours.    Again, thank you for choosing [facility].  Our hope is that these requests will decrease the amount of time that you wait before being seen by our physicians.       _____________________________________________________________  Should you have questions after your visit to [facility], please contact our office at [phone] between the hours of 8:30 a.m. and 5:00 p.m.  Voicemails left after 4:30 p.m. will not be returned until the following business day.  For prescription refill requests, have your pharmacy contact our office with your prescription refill request.

## 2012-10-10 ENCOUNTER — Ambulatory Visit (HOSPITAL_COMMUNITY): Payer: Medicaid Other | Admitting: Oncology

## 2012-10-12 ENCOUNTER — Other Ambulatory Visit (HOSPITAL_COMMUNITY): Payer: Self-pay | Admitting: Oncology

## 2012-10-12 DIAGNOSIS — Z139 Encounter for screening, unspecified: Secondary | ICD-10-CM

## 2012-10-23 ENCOUNTER — Encounter: Payer: Self-pay | Admitting: Oncology

## 2012-11-14 ENCOUNTER — Other Ambulatory Visit (HOSPITAL_COMMUNITY): Payer: Self-pay | Admitting: Oncology

## 2012-11-14 ENCOUNTER — Ambulatory Visit (HOSPITAL_COMMUNITY)
Admission: RE | Admit: 2012-11-14 | Discharge: 2012-11-14 | Disposition: A | Discharge status: Home / Self Care | Payer: Medicaid Other | Source: Ambulatory Visit | Attending: Oncology | Admitting: Oncology

## 2012-11-14 DIAGNOSIS — Z139 Encounter for screening, unspecified: Secondary | ICD-10-CM

## 2012-11-14 DIAGNOSIS — Z1231 Encounter for screening mammogram for malignant neoplasm of breast: Secondary | ICD-10-CM | POA: Insufficient documentation

## 2013-01-29 ENCOUNTER — Encounter (HOSPITAL_COMMUNITY): Payer: Medicaid Other | Attending: Oncology | Admitting: Oncology

## 2013-01-29 ENCOUNTER — Encounter (HOSPITAL_COMMUNITY): Payer: Self-pay | Admitting: Oncology

## 2013-01-29 VITALS — BP 106/69 | HR 68 | Temp 98.3°F | Resp 16 | Wt 137.3 lb

## 2013-01-29 DIAGNOSIS — C50919 Malignant neoplasm of unspecified site of unspecified female breast: Secondary | ICD-10-CM

## 2013-01-29 DIAGNOSIS — C50912 Malignant neoplasm of unspecified site of left female breast: Secondary | ICD-10-CM

## 2013-01-29 DIAGNOSIS — Z170 Estrogen receptor positive status [ER+]: Secondary | ICD-10-CM

## 2013-01-29 DIAGNOSIS — M79609 Pain in unspecified limb: Secondary | ICD-10-CM

## 2013-01-29 NOTE — Progress Notes (Signed)
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 Both metastases greater than 3 mm but she also had extracapsular extension. No LV I was seen. ER +96%, PR +11%, Ki-67 20%, HER-2/neu nonamplified. She had dose dense epirubicin/Cytoxan for 4 cycles followed by weekly Taxol for 12 weeks followed by radiation therapy by Dr. Dorothy Puffer. She then started tamoxifen on 06/23/2010 and she will continue this for 10 years. Date of surgery was 10/09/1999 she finished all radiation therapy as of 06/19/2010. She underwent left breast reconstruction by Dr. Kelly Splinter.  #2 superficial phlebitis of the left forearm. Upon reviewing the ultrasound she did not have any evidence for DVT. I therefore stopped the xarelto and start on anti-inflammatory therapy consisting of Aleve and she did that for 2 weeks with resolution.  #3 hypothyroidism on Synthroid replacement.  She is here today for routine followup doing very well. She has a negative oncology review of systems.  Her vital signs are stable. Lymph nodes are negative throughout. The left reconstructed breast is negative. She has no arm edema. She has no adenopathy in any location. Lungs are clear. She has a tattoo on the right upper back with the data of her conclusion of radiation therapy. Heart shows a regular rhythm and rate without murmur rub or gallop. Right breast is negative for masses. Abdomen is soft and nontender without organomegaly. She's had of reconstruction of the right breast to match the size of the left breast.  scars are all well-healed. Abdomen shows no hepatosplenomegaly. She has no leg edema either.  She does not need blood work at this time. We'll see her back in 6 months. Thus far there is no evidence of recurrent disease. She does have a little tenderness I should mention in the left axilla but she has been lifting her grandbaby the waist 30+ pounds quite a  bit over the last several days to week but there is nothing to palpate there is one small point of tenderness in the left axilla high in the axilla but with no nodularity, no redness, no veins to feel etc. If this does not get better she will let me know but I've asked her to be a little easier on lifting this baby.

## 2013-01-29 NOTE — Patient Instructions (Addendum)
[facility] Discharge Instructions  RECOMMENDATIONS MADE BY THE CONSULTANT AND ANY TEST RESULTS WILL BE SENT TO YOUR REFERRING PHYSICIAN.  EXAM FINDINGS BY THE PHYSICIAN TODAY AND SIGNS OR SYMPTOMS TO REPORT TO CLINIC OR PRIMARY PHYSICIAN: exam and discussion by MD.  Lisabeth Devoid the discomfort in your axilla is related to activity.  MEDICATIONS PRESCRIBED:  None - continue tamoxifen  INSTRUCTIONS GIVEN AND DISCUSSED: Report any new lumps, bone pain, shortness of breath or other symptoms.  SPECIAL INSTRUCTIONS/FOLLOW-UP: Follow-up in 6 months.  Thank you for choosing [facility] to provide your oncology and hematology care.  To afford each patient quality time with our providers, please arrive at least 15 minutes before your scheduled appointment time.  With your help, our goal is to use those 15 minutes to complete the necessary work-up to ensure our physicians have the information they need to help with your evaluation and healthcare recommendations.    Effective January 1st, 2014, we ask that you re-schedule your appointment with our physicians should you arrive 10 or more minutes late for your appointment.  We strive to give you quality time with our providers, and arriving late affects you and other patients whose appointments are after yours.    Again, thank you for choosing [facility].  Our hope is that these requests will decrease the amount of time that you wait before being seen by our physicians.       _____________________________________________________________  Should you have questions after your visit to [facility], please contact our office at [phone] between the hours of 8:30 a.m. and 5:00 p.m.  Voicemails left after 4:30 p.m. will not be returned until the following business day.  For prescription refill requests, have your pharmacy contact our office with your prescription refill request.

## 2013-02-27 ENCOUNTER — Telehealth: Payer: Self-pay | Admitting: Internal Medicine

## 2013-02-27 DIAGNOSIS — E079 Disorder of thyroid, unspecified: Secondary | ICD-10-CM

## 2013-02-27 DIAGNOSIS — E039 Hypothyroidism, unspecified: Secondary | ICD-10-CM

## 2013-02-27 NOTE — Telephone Encounter (Signed)
Please advise if okay to change to levothyroxine?

## 2013-02-27 NOTE — Telephone Encounter (Signed)
Pt would like to change this med to levothyroxine so pt can get for $4.00.  [facility]/ [address], [place]  90 day refill.

## 2013-02-27 NOTE — Telephone Encounter (Signed)
Ok   suggest  repeat TSH in 6-8 weeks

## 2013-02-28 ENCOUNTER — Telehealth: Payer: Self-pay | Admitting: Internal Medicine

## 2013-02-28 MED ORDER — LEVOTHYROXINE SODIUM 112 MCG PO TABS: 112.0000 ug | ORAL_TABLET | Freq: Every day | ORAL | Status: DC

## 2013-02-28 NOTE — Telephone Encounter (Signed)
Tried to contact pt mailbox full unable to leave message. Rx changed to Levothyroxine and sent to pharmacy. Needs repeat TSH in 6-8 weeks.

## 2013-02-28 NOTE — Telephone Encounter (Signed)
Pharmacy called to inquire about this pt's levothyroxine (SYNTHROID, LEVOTHROID) 112 MCG tablet RX. She stated that the pt has been on the name brand in the past and was wondering if the pt requested a generic version. Please assist.

## 2013-03-01 NOTE — Telephone Encounter (Signed)
Called pharmacy and spoke to Diane told her pt wanted to switch to Levothyroxine because it is cheaper for her. Diane verbalized understanding.

## 2013-03-01 NOTE — Telephone Encounter (Signed)
Tried to contact pt again mailbox full.

## 2013-03-02 NOTE — Telephone Encounter (Signed)
Spoke to pt's mother told her to give her a message to call the office.

## 2013-03-05 NOTE — Telephone Encounter (Signed)
Left detailed message on voicemail that pt needs repeat TSH in 6-8 weeks after starting new medication Levothyroxine. Order in EPIC.

## 2013-03-16 NOTE — Telephone Encounter (Signed)
Pt works at [employer] and will try to have labs there.

## 2013-03-22 ENCOUNTER — Other Ambulatory Visit: Payer: Self-pay | Admitting: Internal Medicine

## 2013-03-22 LAB — TSH: TSH: 0.27 u[IU]/mL — ABNORMAL LOW (ref 0.350–4.500)

## 2013-04-02 ENCOUNTER — Telehealth (HOSPITAL_COMMUNITY): Payer: Self-pay | Admitting: Oncology

## 2013-04-02 ENCOUNTER — Other Ambulatory Visit (HOSPITAL_COMMUNITY): Payer: Self-pay | Admitting: Oncology

## 2013-04-02 DIAGNOSIS — C50919 Malignant neoplasm of unspecified site of unspecified female breast: Secondary | ICD-10-CM

## 2013-04-02 MED ORDER — TAMOXIFEN CITRATE 10 MG PO TABS: 10.0000 mg | ORAL_TABLET | Freq: Two times a day (BID) | ORAL | Status: DC

## 2013-04-04 ENCOUNTER — Other Ambulatory Visit (HOSPITAL_COMMUNITY): Payer: Self-pay | Admitting: Oncology

## 2013-05-18 ENCOUNTER — Encounter (HOSPITAL_COMMUNITY): Payer: Self-pay | Admitting: Oncology

## 2013-05-18 ENCOUNTER — Encounter (HOSPITAL_COMMUNITY): Payer: Self-pay | Attending: Oncology | Admitting: Oncology

## 2013-05-18 VITALS — BP 112/70 | HR 61 | Temp 97.8°F | Resp 16 | Wt 129.4 lb

## 2013-05-18 DIAGNOSIS — E039 Hypothyroidism, unspecified: Secondary | ICD-10-CM

## 2013-05-18 DIAGNOSIS — C50912 Malignant neoplasm of unspecified site of left female breast: Secondary | ICD-10-CM

## 2013-05-18 DIAGNOSIS — C773 Secondary and unspecified malignant neoplasm of axilla and upper limb lymph nodes: Secondary | ICD-10-CM

## 2013-05-18 DIAGNOSIS — Z170 Estrogen receptor positive status [ER+]: Secondary | ICD-10-CM

## 2013-05-18 DIAGNOSIS — C50419 Malignant neoplasm of upper-outer quadrant of unspecified female breast: Secondary | ICD-10-CM

## 2013-05-18 NOTE — Progress Notes (Signed)
Renee Boga, MD [address] [place] [address]  Infiltrating ductal carcinoma of breast, left  CURRENT THERAPY: On Tamoxifen beginning on 06/23/2010 and she will take for 10 years.  INTERVAL HISTORY: [facility]e [name] 42 y.o. 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 Both metastases greater than 3 mm but she also had extracapsular extension. No LV I was seen. ER +96%, PR +11%, Ki-67 20%, HER-2/neu nonamplified. She had dose dense epirubicin/Cytoxan for 4 cycles followed by weekly Taxol for 12 weeks followed by radiation therapy by Dr. Dorothy Puffer. She then started tamoxifen on 06/23/2010 and she will continue this for 10 years. Date of surgery was 10/09/1999 she finished all radiation therapy as of 06/19/2010.  PATIENT MADE THIS APPOINTMENT DUE TO RECENT PERSONAL EVENTS.  The patient is engaged and due to get married next week to a man named [place].  Unfortunately, Randy's son Ernest Mallick (46 years old) overdosed on Opana and he was found lifeless on the couch.    Aurore is having a very difficult time with this.  She reports that she feels guilty that she has survived cancer while other have not.  She feels guilty that her children are healthy, and her fiance's only son is deceased.  She is experiencing a great deal of sadness.  She made this appointment because, "you all are like family to me and I trust you."  She understands that this is not the appropriate place for treatment, but she was lost as to where to start.    We had a long conversation about a whole host of things regarding her feelings and emotions associated with this event.  I validated her feeling and comforted her.  She had tearful moments.  She let out a number of emotions and she remained very eloquent in describing her feelings.  She has a mixture of sadness,  guilt, hurt, pain, etc associated with this trauma.  We talked for greater than 35 minutes.  At the conclusion of our visit, she felt better and I think a burden was lifted off of her shoulders as she is trying to be very strong for her fiance.  Today, she was able to expel a lot of emotions.    She did smile occasionally during our discussion.  I shared with her a little bit about my personal life and the loss of my father shortly after graduating from undergraduate studies and the emotions I have felt and still feel to this day.  It was actually therapeutic for me as well.   She denies any homicidal or suicidal ideations.  I would like to refer her to a clinical psychologist to help her, and maybe her fiance work through this traumatic event.      Past Medical History  Diagnosis Date  . Breast cancer     2010 ; left side mastectomy chemo pill  . Thyroid disease   . Hypothyroidism   . 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     has No Known Allergies.  Renee [name] does not currently have medications on file.  Past Surgical History  Procedure Laterality Date  . Mastectomy      left side  . Portacath placement    . Port-a-cath removal    . Breast reconstruction  01/2011     PHYSICAL EXAMINATION  ECOG PERFORMANCE STATUS: 0 - Asymptomatic  Filed Vitals:   05/18/13 1438  BP: 112/70  Pulse: 61  Temp: 97.8 F (36.6 C)  Resp: 16    GENERAL:alert, healthy, well nourished, well developed, comfortable, cooperative and crying SKIN: no make-up HEAD: Normocephalic, No masses, lesions, tenderness or abnormalities EYES: normal EARS: External ears normal OROPHARYNX:mucous membranes are moist  NECK: supple, trachea  midline LYMPH:  not examined BREAST:not examined LUNGS: not examined HEART: not examined ABDOMEN: not examined BACK: not examined EXTREMITIES:not examined  NEURO:  A and O x 3    LABORATORY DATA: CBC    Component Value Date/Time   WBC 8.3 09/18/2012 1420   RBC 3.73* 09/18/2012 1420   HGB 12.7 09/18/2012 1420   HCT 37.0 09/18/2012 1420   PLT 166 09/18/2012 1420   MCV 99.2 09/18/2012 1420   MCH 34.0 09/18/2012 1420   MCHC 34.3 09/18/2012 1420   RDW 13.4 09/18/2012 1420   LYMPHSABS 2.8 09/18/2012 1420   MONOABS 0.5 09/18/2012 1420   EOSABS 0.1 09/18/2012 1420   BASOSABS 0.0 09/18/2012 1420      Chemistry      Component Value Date/Time   NA 140 09/18/2012 1420   K 3.6 09/18/2012 1420   CL 101 09/18/2012 1420   CO2 28 09/18/2012 1420   BUN 8 09/18/2012 1420   CREATININE 0.74 09/18/2012 1420      Component Value Date/Time   CALCIUM 9.6 09/18/2012 1420   ALKPHOS 59 09/18/2012 1420   AST 17 09/18/2012 1420   ALT 12 09/18/2012 1420   BILITOT 0.2* 09/18/2012 1420       RADIOGRAPHIC STUDIES:  11/15/2012  *RADIOLOGY REPORT*  Clinical Data: Screening.  MAMMOGRAPHIC UNILATERAL RIGHT DIGITAL SCREENING WITH CAD  The patient has implants. Standard and implant displaced views  were performed.  Comparison: Previous exams.  FINDINGS:  ACR Breast Density Category 3: The breast tissue is heterogeneously  dense.  No suspicious masses, architectural distortion, or calcifications  are present.  Images were processed with CAD.  IMPRESSION:  No mammographic evidence of malignancy.  A result letter of this screening mammogram will be mailed directly  to the patient.  RECOMMENDATION:  Screening mammogram in one year. (Code:SM-B-01Y)  BI-RADS CATEGORY 2: Benign finding(s).  Original Report Authenticated By: Sherian Rein, M.D.     ASSESSMENT:  1. 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 Both  metastases greater than 3 mm but she also had extracapsular extension. No LV I was seen. ER +96%, PR +11%, Ki-67 20%, HER-2/neu nonamplified. She had dose dense epirubicin/Cytoxan for 4 cycles followed by weekly Taxol for 12 weeks followed by radiation therapy by Dr. Dorothy Puffer. She then started tamoxifen on 06/23/2010 and she will continue this for 10 years. Date of surgery was 10/09/1999 she finished all radiation therapy as of 06/19/2010. 2. Breast reconstruction by Dr. Wayland Denis 3. Superficial phlebitis of the left forearm. Upon reviewing the ultrasound she did not have any evidence for DVT. Therefore, Xarelto was stopped and she was treated symptomatically with Aleve x 2 weeks with resolution.  4. Hypothyroidism on Synthroid replacement. 5. Traumatic death of soon-to-be stepson secondary to opana overdose at the age  of 21.   PLAN:  1.Long discussion regarding her feeling 2. Referral to clinical psychologist 3. Encouraged patient to call me if needed 4. Return as scheduled.    THERAPY PLAN:  Today's visit had zero discussion regarding cancer and we focused on recent personal event.    All questions were answered. The patient knows to call the clinic with any problems, questions or concerns. We can certainly see the patient much sooner if necessary.  Patient and plan discussed with Dr. Alla German and he is in agreement with the aforementioned.   I spent 40 minutes counseling the patient face to face. The total time spent in the appointment was 45 minutes.  Myliah Medel

## 2013-05-18 NOTE — Patient Instructions (Signed)
[facility] Discharge Instructions  RECOMMENDATIONS MADE BY THE CONSULTANT AND ANY TEST RESULTS WILL BE SENT TO YOUR REFERRING PHYSICIAN.  EXAM FINDINGS BY THE PHYSICIAN TODAY AND SIGNS OR SYMPTOMS TO REPORT TO CLINIC OR PRIMARY PHYSICIAN: Exam and findings as discussed by Dellis Anes, PA- C.  Will make a referral to [facility] and will call you with the appointment.  MEDICATIONS PRESCRIBED:  none  INSTRUCTIONS/FOLLOW-UP: As scheduled.  Thank you for choosing [facility] to provide your oncology and hematology care.  To afford each patient quality time with our providers, please arrive at least 15 minutes before your scheduled appointment time.  With your help, our goal is to use those 15 minutes to complete the necessary work-up to ensure our physicians have the information they need to help with your evaluation and healthcare recommendations.    Effective January 1st, 2014, we ask that you re-schedule your appointment with our physicians should you arrive 10 or more minutes late for your appointment.  We strive to give you quality time with our providers, and arriving late affects you and other patients whose appointments are after yours.    Again, thank you for choosing [facility].  Our hope is that these requests will decrease the amount of time that you wait before being seen by our physicians.       _____________________________________________________________  Should you have questions after your visit to [facility], please contact our office at [phone] between the hours of 8:30 a.m. and 5:00 p.m.  Voicemails left after 4:30 p.m. will not be returned until the following business day.  For prescription refill requests, have your pharmacy contact our office with your prescription refill request.

## 2013-05-21 ENCOUNTER — Telehealth (HOSPITAL_COMMUNITY): Payer: Self-pay | Admitting: Oncology

## 2013-05-31 ENCOUNTER — Ambulatory Visit (HOSPITAL_COMMUNITY): Payer: 59 | Admitting: Psychology

## 2013-06-14 ENCOUNTER — Other Ambulatory Visit: Payer: Self-pay

## 2013-07-31 ENCOUNTER — Ambulatory Visit (HOSPITAL_COMMUNITY): Payer: Medicaid Other

## 2013-08-01 ENCOUNTER — Encounter (HOSPITAL_COMMUNITY): Payer: Self-pay

## 2013-08-01 NOTE — Progress Notes (Signed)
This encounter was created in error - please disregard.

## 2013-09-10 NOTE — Progress Notes (Signed)
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  However, with the recent publication of SOFT and TEXT trial in NEJM she would be a great candidate for Exemestane + Ovarian Suppression (unless proven to be post-menopausal with labs today)   INTERVAL HISTORY: LAURITA PERON 43 y.o. 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 Both metastases greater than 3 mm but she also had extracapsular extension. No LV I was seen. ER +96%, PR +11%, Ki-67 20%, HER-2/neu nonamplified. She had dose dense epirubicin/Cytoxan for 4 cycles followed by weekly Taxol for 12 weeks followed by radiation therapy by Dr. Kyung Rudd. She then started tamoxifen on 06/23/2010 and she will continue this for 10 years (versus a switch to an AI when post-menopausal). Date of surgery was 10/09/1999 she finished all radiation therapy as of 06/19/2010.   I personally reviewed and went over radiographic studies with the patient.  The results are noted within this dictation.  Mammogram on 11/14/2012 was BIRADS 2 and she will be due for her next annual screening mammogram this April 2015.  We have reviewed the NCCN guidelines pertaining to her breast cancer. NCCN guidelines recommends the following surveillance for invasive breast cancer:  A. History and Physical exam every 4-6 months for 5  years and then every 12 months.  B. Mammography every 12 months  C. Women on Tamoxifen: annual gynecologic assessment every 12 months if uterus is present.  D. Women on aromatase inhibitor or who experience ovarian failure secondary to treatment should have monitoring of bone health with a bone mineral density determination at baseline and periodically thereafter.  E. Assess and encourage adherence to adjuvant endocrine therapy.  F. Evidence suggests that active lifestyle and achieving and maintaining an ideal body weight (20-25 BMI) may lead to optimal breast cancer outcomes.  Recently published data from the SOFT and TEXT trial in the [place] [employer] titled "Adjuvant Ovarian Suppression in Premenopausal Breast Cancer."   The results of SOFT shows that, considering the entire population of patients who underwent randomization, the addition of ovarian suppression to adjuvant tamoxifen did not significantly improve disease-free survival. However, SOFT investigated ovarian suppression in 2 distinct patient cohort.  The first cohort included [id number] premenopausal women for whom adjuvant tamoxifen without chemotherapy was considered to be suitable treatment. These patients were predominantly older than 44 years of age, had small, node-negative tumors of low to intermediate grade, and have excellent outcomes with tamoxifen alone after a median followup of 67 months. The findings in this cohort do not currently inform us about the clinical relevance of ovarian suppression because one third of the first events were not related to breast cancer, freedom from recurrence exceeded 95% at 5 years, and additional recurrences are anticipated with further followup.  By contrast, the second toe port to include [id number] women who remained premenopausal after completing chemotherapy, as shown by estradiol measurement regardless of menses. The clinicopathologic features  that warranted prior chemotherapy use and  the younger age of women who remained premenopausal after chemotherapy (median age, 47 years) contributed to the higher risk of recurrence in this cohort been in the cohort the had not received chemotherapy. With a median of 67 months of followup, the number of breast cancer recurrences observed was large enough to indicate that including ovarian suppression as a component of adjuvant therapy can meaningfully reduce recurrences in this cohort.  After adjustment for covariates in the multivariable Cox model, tamoxifen plus ovarian suppression resulted in a 22% reduction in the relative risk of breast cancer recurrence, a second invasive cancer, or death (P = 0.03) and a 25% reduction in the relative risk of breast cancer recurrence, as compared with tamoxifen alone.  On the basis of a combined analysis with TEXT, exemestane plus ovarian suppression demonstrated a 28% reduction in the relative risk of breast cancer recurrence, the second invasive cancer, or death when compared to tamoxifen plus ovarian suppression.  Additionally, there was a 34% reduction in the relative risk of breast cancer recurrence in the exemestane plus ovarian suppression group (P < 0.001).    Overall, tamoxifen plus ovarian suppression resulted in an absolute improvement of 2.0 percentage points, as compared with tamoxifen alone, in the proportion of patients without recurrent breast cancer at 5 years. In the higher risk cohort of patients who remained premenopausal after chemotherapy, tamoxifen plus ovarian suppression resulted in an absolute improvement of 4.5 percentage points, as compared with tamoxifen alone, in the proportion of patients without recurrent breast cancer at 5 years; with exemestane plus ovarian suppression, the absolute improvement was 7.7 percentage points, as compared to tamoxifen alone. These observed the relative and absolute benefits at 5 years from ovarian suppression plus tamoxifen or ovarian suppression plus  exemestane, as compared with tamoxifen alone, compare favorably with a practice changing results of randomized trials of adjuvant aromatase inhibitors versus tamoxifen in postmenopausal women.  Patients who received a diagnosis of hormone receptor positive breast cancer when they are younger than 44 years of age are a subgroup considered to be at higher risk for adverse outcomes than are older premenopausal women, on the basis of retrospective analyses of data from IBCSG and [place] Intergroup trials.   The results of observed in this subgroup in SOFT add to the evidence that ovarian suppression plays an important role in younger premenopausal patients. Among the women younger than 44 years of age, breast cancer recurred within 5 years and approximately 1/3 of patients assigned to receive tamoxifen alone but N1 6 of those assigned to receive exemestane plus ovarian suppression.  - NEJM, Vol 372, No 5, Sep 06, 2013   With this information, Tykisha would benefit from a switch of Tamoxifen alone to Exemestane + Ovarian suppression.  She reports that she went through menopause at a very young age in her late thirties.  She reports her last menses was > 2 years ago.  I strongly encouraged Trang to consider this switch.  We discussed the risks, benefits, alternatives (including continuing Tamoxifen), and side effects of therapy.  We compared and contrasted Tamoxifen to Aromatase inhibitors.  She will consider her options and let us know.  Either way, I will perform LH, FSH, and estradiol to prove she is post-menopausal.  If she is shown to be biochemically post-menopausal, then we could switch her to Exemestane and forgo ovarian suppression as these organs would already suppressed.   Justin continues to mourn the loss of her step-son.  See previous dictation for details.    Floyce unfortunately was released from her job at [employer] and is working on employment.  She notes some left upper arm pain.  This is the same arm  as her breast cancer and as her phlebitis in the past.  Exam is unimpressive and I recommended Aleve for the discomfort.   Oncologically, she denies any complaints and ROS questioning is negative.   Past Medical History  Diagnosis Date  . Breast cancer     2010 ; left side mastectomy chemo pill  . Thyroid disease   . Hypothyroidism   . 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     has No Known Allergies.  Ms. Axelrod had no medications administered during this visit.  Past Surgical History  Procedure Laterality Date  . Mastectomy      left side  . Portacath placement    . Port-a-cath removal    . Breast reconstruction  01/2011    Denies any headaches, dizziness, double vision, fevers, chills, night sweats, nausea, vomiting, diarrhea, constipation, chest pain, heart palpitations, shortness of breath, blood in stool, black tarry stool, urinary pain, urinary burning, urinary frequency, hematuria.   PHYSICAL EXAMINATION  ECOG PERFORMANCE STATUS: 0 - Asymptomatic  Filed Vitals:   09/11/13 1126  BP: 108/72  Pulse: 80  Temp: 97.9 F (36.6 C)  Resp: 16    GENERAL:alert, healthy, no distress, well nourished, well developed, comfortable, cooperative and smiling SKIN: skin color, texture, turgor are normal, no rashes or significant lesions HEAD: Normocephalic, No masses, lesions, tenderness or abnormalities EYES: normal, PERRLA, EOMI, Conjunctiva are pink and non-injected EARS: External ears normal OROPHARYNX:mucous membranes are moist  NECK: supple, no adenopathy, thyroid normal size, non-tender, without nodularity, no stridor, non-tender, trachea midline LYMPH:  no palpable lymphadenopathy, no  hepatosplenomegaly BREAST:right breast normal without mass, skin or nipple changes or axillary nodes, left implant, no palpable abnormalities otherwise, but with tenderness in the 9 o'clock position near the sternum LUNGS: clear to auscultation and percussion HEART: regular rate & rhythm, no murmurs, no gallops, S1 normal and S2 normal ABDOMEN:abdomen soft, non-tender, normal bowel sounds, no masses or organomegaly and no hepatosplenomegaly BACK: Back symmetric, no curvature., No CVA tenderness EXTREMITIES:less then 2 second capillary refill, no joint deformities, effusion, or inflammation, no edema, no skin discoloration, no clubbing, no cyanosis  NEURO: alert & oriented x 3 with fluent speech, no focal motor/sensory deficits, gait normal    LABORATORY DATA: CBC    Component Value Date/Time   WBC 8.3 09/11/2013 1218   RBC 4.17 09/11/2013 1218   HGB 14.5 09/11/2013 1218   HCT 42.1 09/11/2013 1218   PLT 164 09/11/2013 1218   MCV 101.0* 09/11/2013 1218   MCH 34.8* 09/11/2013 1218   MCHC 34.4 09/11/2013 1218   RDW 13.5 09/11/2013 1218   LYMPHSABS 2.8 09/11/2013 1218   MONOABS 0.5 09/11/2013 1218   EOSABS 0.1 09/11/2013 1218   BASOSABS 0.0 09/11/2013 1218      Chemistry      Component Value Date/Time   NA 140 09/18/2012 1420   K 3.6 09/18/2012 1420   CL 101 09/18/2012 1420   CO2 28 09/18/2012 1420   BUN 8 09/18/2012 1420   CREATININE 0.74 09/18/2012 1420  Component Value Date/Time   CALCIUM 9.6 09/18/2012 1420   ALKPHOS 59 09/18/2012 1420   AST 17 09/18/2012 1420   ALT 12 09/18/2012 1420   BILITOT 0.2* 09/18/2012 1420       RADIOGRAPHIC STUDIES:  11/14/2012  *RADIOLOGY REPORT*  Clinical Data: Screening.  MAMMOGRAPHIC UNILATERAL RIGHT DIGITAL SCREENING WITH CAD  The patient has implants. Standard and implant displaced views  were performed.  Comparison: Previous exams.  FINDINGS:  ACR Breast Density Category 3: The breast tissue is heterogeneously  dense.  No suspicious masses, architectural  distortion, or calcifications  are present.  Images were processed with CAD.  IMPRESSION:  No mammographic evidence of malignancy.  A result letter of this screening mammogram will be mailed directly  to the patient.  RECOMMENDATION:  Screening mammogram in one year. (Code:SM-B-01Y)  BI-RADS CATEGORY 2: Benign finding(s).  Original Report Authenticated By: Abelardo Diesel, M.D.    ASSESSMENT:  1. 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 Both metastases greater than 3 mm but she also had extracapsular extension. No LV I was seen. ER +96%, PR +11%, Ki-67 20%, HER-2/neu nonamplified. She had dose dense epirubicin/Cytoxan for 4 cycles followed by weekly Taxol for 12 weeks followed by radiation therapy by Dr. Kyung Rudd. She then started tamoxifen on 06/23/2010 and she will continue this for 10 years (versus a switch to an AI when post-menopausal). Date of surgery was 10/09/1999 she finished all radiation therapy as of 06/19/2010.  However, with the recent publication of SOFT and TEXT trial in NEJM she would be a great candidate for Exemestane + Ovarian Suppression (unless proven to be post-menopausal with labs today) 2. Breast reconstruction by Dr. Theodoro Kos  3. Superficial phlebitis of the left forearm. Upon reviewing the ultrasound she did not have any evidence for DVT. Therefore, Xarelto was stopped and she was treated symptomatically with Aleve x 2 weeks with resolution.  4. Hypothyroidism on Synthroid replacement.  5. Traumatic death of soon-to-be stepson secondary to opana overdose at the age of 62, still grieving 25. Left upper arm discomfort without any physical changes and negative exam. 7. Loss of job at [employer]  Patient Active Problem List   Diagnosis Date Noted  . DVT of upper extremity (deep vein thrombosis) 09/12/2012  . 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 Thyroid  disease   . HYPOTHYROIDISM 02/24/2009  . [place] DISEASE 04/26/2008  . HYPERLIPIDEMIA 04/01/2008  . ANEMIA-IRON DEFICIENCY 04/01/2008  . DEPRESSION 04/01/2008  . TREMOR 04/01/2008  . NEPHROLITHIASIS, HX OF 04/01/2008     PLAN:  1. I personally reviewed and went over laboratory results with the patient.  The results are noted within this dictation. 2. I personally reviewed and went over radiographic studies with the patient.  The results are noted within this dictation.   3. Next screening mammogram is due in April 2015 4. Labs today: CBC diff, CMET, LH, FSH, estradiol 5. Review of the NCCN guidelines pertaining to surveillance of invasive breast cancer.  6. Recommend switching to Exemestane per SOFT study as dictated above.  If she decides to do so, she will need a baseline bone density examination. 7. Recommend Aleve for left arm discomfort 8. Rx change to Tamoxifen 20 mg daily.   9. She is to strongly consider the recent NEJM study dictated above.  10. Return in 6 months for follow-up.   THERAPY PLAN:  Due to recently published data in [place] on Sep 06, 2013, Dilia meets the criteria of patients who significantly benefited from exemestane + ovarian suppression.  I suspect she is already post-menopausal and therefore she would benefit from an AI. NCCN guidelines recommends the following surveillance for invasive breast cancer:  A. History and Physical exam every 4-6 months for 5 years and then every 12 months.  B. Mammography every 12 months  C. Women on Tamoxifen: annual gynecologic assessment every 12 months if uterus is present.  D. Women on aromatase inhibitor or who experience ovarian failure secondary to treatment should have monitoring of bone health with a bone mineral density determination at baseline and periodically thereafter.  E. Assess and encourage adherence to adjuvant endocrine therapy.  F. Evidence suggests that active lifestyle and achieving and maintaining an ideal  body weight (20-25 BMI) may lead to optimal breast cancer outcomes.   All questions were answered. The patient knows to call the clinic with any problems, questions or concerns. We can certainly see the patient much sooner if necessary.  Patient and plan discussed with Dr. Farrel Gobble and he is in agreement with the aforementioned.   KEFALAS,THOMAS

## 2013-09-11 ENCOUNTER — Encounter (HOSPITAL_COMMUNITY): Payer: BC Managed Care – PPO | Attending: Hematology and Oncology

## 2013-09-11 ENCOUNTER — Encounter (HOSPITAL_COMMUNITY): Payer: Self-pay | Admitting: Oncology

## 2013-09-11 ENCOUNTER — Encounter (HOSPITAL_COMMUNITY): Payer: BC Managed Care – PPO | Attending: Oncology | Admitting: Oncology

## 2013-09-11 VITALS — BP 108/72 | HR 80 | Temp 97.9°F | Resp 16 | Wt 124.9 lb

## 2013-09-11 DIAGNOSIS — C50919 Malignant neoplasm of unspecified site of unspecified female breast: Secondary | ICD-10-CM | POA: Insufficient documentation

## 2013-09-11 DIAGNOSIS — C50419 Malignant neoplasm of upper-outer quadrant of unspecified female breast: Secondary | ICD-10-CM

## 2013-09-11 DIAGNOSIS — Z923 Personal history of irradiation: Secondary | ICD-10-CM | POA: Insufficient documentation

## 2013-09-11 DIAGNOSIS — I82629 Acute embolism and thrombosis of deep veins of unspecified upper extremity: Secondary | ICD-10-CM

## 2013-09-11 DIAGNOSIS — C50912 Malignant neoplasm of unspecified site of left female breast: Secondary | ICD-10-CM

## 2013-09-11 DIAGNOSIS — D509 Iron deficiency anemia, unspecified: Secondary | ICD-10-CM | POA: Insufficient documentation

## 2013-09-11 DIAGNOSIS — Z86718 Personal history of other venous thrombosis and embolism: Secondary | ICD-10-CM | POA: Insufficient documentation

## 2013-09-11 DIAGNOSIS — E785 Hyperlipidemia, unspecified: Secondary | ICD-10-CM | POA: Insufficient documentation

## 2013-09-11 DIAGNOSIS — E039 Hypothyroidism, unspecified: Secondary | ICD-10-CM | POA: Insufficient documentation

## 2013-09-11 DIAGNOSIS — C773 Secondary and unspecified malignant neoplasm of axilla and upper limb lymph nodes: Secondary | ICD-10-CM | POA: Insufficient documentation

## 2013-09-11 DIAGNOSIS — Z79810 Long term (current) use of selective estrogen receptor modulators (SERMs): Secondary | ICD-10-CM | POA: Insufficient documentation

## 2013-09-11 LAB — COMPREHENSIVE METABOLIC PANEL
ALT: 12 U/L (ref 0–35)
AST: 19 U/L (ref 0–37)
Albumin: 4 g/dL (ref 3.5–5.2)
Alkaline Phosphatase: 60 U/L (ref 39–117)
BUN: 15 mg/dL (ref 6–23)
CALCIUM: 9.6 mg/dL (ref 8.4–10.5)
CO2: 30 meq/L (ref 19–32)
CREATININE: 0.89 mg/dL (ref 0.50–1.10)
Chloride: 99 mEq/L (ref 96–112)
GFR, EST NON AFRICAN AMERICAN: 78 mL/min — AB (ref >90)
Glucose, Bld: 96 mg/dL (ref 70–99)
Potassium: 4.2 mEq/L (ref 3.7–5.3)
SODIUM: 141 meq/L (ref 137–147)
Total Bilirubin: 0.2 mg/dL — ABNORMAL LOW (ref 0.3–1.2)
Total Protein: 7.5 g/dL (ref 6.0–8.3)

## 2013-09-11 LAB — CBC WITH DIFFERENTIAL/PLATELET
Basophils Absolute: 0 10*3/uL (ref 0.0–0.1)
Basophils Relative: 0 % (ref 0–1)
Eosinophils Absolute: 0.1 10*3/uL (ref 0.0–0.7)
Eosinophils Relative: 1 % (ref 0–5)
HEMATOCRIT: 42.1 % (ref 36.0–46.0)
Hemoglobin: 14.5 g/dL (ref 12.0–15.0)
LYMPHS ABS: 2.8 10*3/uL (ref 0.7–4.0)
LYMPHS PCT: 34 % (ref 12–46)
MCH: 34.8 pg — ABNORMAL HIGH (ref 26.0–34.0)
MCHC: 34.4 g/dL (ref 30.0–36.0)
MCV: 101 fL — AB (ref 78.0–100.0)
MONO ABS: 0.5 10*3/uL (ref 0.1–1.0)
Monocytes Relative: 6 % (ref 3–12)
Neutro Abs: 5 10*3/uL (ref 1.7–7.7)
Neutrophils Relative %: 60 % (ref 43–77)
Platelets: 164 10*3/uL (ref 150–400)
RBC: 4.17 MIL/uL (ref 3.87–5.11)
RDW: 13.5 % (ref 11.5–15.5)
WBC: 8.3 10*3/uL (ref 4.0–10.5)

## 2013-09-11 MED ORDER — TAMOXIFEN CITRATE 20 MG PO TABS: 20.0000 mg | ORAL_TABLET | Freq: Every day | ORAL | Status: DC

## 2013-09-11 NOTE — Patient Instructions (Addendum)
[facility] Discharge Instructions  RECOMMENDATIONS MADE BY THE CONSULTANT AND ANY TEST RESULTS WILL BE SENT TO YOUR REFERRING PHYSICIAN.  EXAM FINDINGS BY THE PHYSICIAN TODAY AND SIGNS OR SYMPTOMS TO REPORT TO CLINIC OR PRIMARY PHYSICIAN: Exam and findings as discussed by Robynn Pane, PA-C.  Think about use of exemestane instead of tamoxifen and let us know what you want to do.  You can take aleve for your arm pain.  Report any new lumps, bone pain, shortness of breath or other symptoms.  MEDICATIONS PRESCRIBED:  Tamoxifen 20 mg daily  INSTRUCTIONS/FOLLOW-UP: Follow-up in 6 months.  Thank you for choosing [facility] to provide your oncology and hematology care.  To afford each patient quality time with our providers, please arrive at least 15 minutes before your scheduled appointment time.  With your help, our goal is to use those 15 minutes to complete the necessary work-up to ensure our physicians have the information they need to help with your evaluation and healthcare recommendations.    Effective January 1st, 2014, we ask that you re-schedule your appointment with our physicians should you arrive 10 or more minutes late for your appointment.  We strive to give you quality time with our providers, and arriving late affects you and other patients whose appointments are after yours.    Again, thank you for choosing [facility].  Our hope is that these requests will decrease the amount of time that you wait before being seen by our physicians.       _____________________________________________________________  Should you have questions after your visit to [facility], please contact our office at [phone] between the hours of 8:30 a.m. and 5:00 p.m.  Voicemails left after 4:30 p.m. will not be returned until the following business day.  For prescription refill requests, have your pharmacy contact our office with your  prescription refill request.

## 2013-09-11 NOTE — Progress Notes (Signed)
Labs drawn today for estradiol,cbc/diff,cmp,cea,[id number],luteinizing hormone,FSH

## 2013-09-12 ENCOUNTER — Other Ambulatory Visit (HOSPITAL_COMMUNITY): Payer: Self-pay | Admitting: Oncology

## 2013-09-12 ENCOUNTER — Telehealth (HOSPITAL_COMMUNITY): Payer: Self-pay | Admitting: *Deleted

## 2013-09-12 DIAGNOSIS — C50919 Malignant neoplasm of unspecified site of unspecified female breast: Secondary | ICD-10-CM

## 2013-09-12 LAB — FOLLICLE STIMULATING HORMONE: FSH: 41.9 m[IU]/mL

## 2013-09-12 LAB — ESTRADIOL: Estradiol: 16.2 pg/mL

## 2013-09-12 LAB — CEA: CEA: 3.3 ng/mL (ref 0.0–5.0)

## 2013-09-12 LAB — LUTEINIZING HORMONE: LH: 19.9 m[IU]/mL

## 2013-09-12 LAB — CANCER ANTIGEN 27.29: CA 27.29: 28 U/mL (ref 0–39)

## 2013-09-12 MED ORDER — EXEMESTANE 25 MG PO TABS: 25.0000 mg | ORAL_TABLET | Freq: Every day | ORAL | Status: DC

## 2013-09-12 NOTE — Telephone Encounter (Signed)
Patient notified that she is post-menopausal per labs and per Tom. He recommended an AI (anastrazole) instead of Tamoxifen. Patient is going to stop taking Tamoxifen (today was the last dose) and start taking Anastrazole tomorrow per Tom's instructions. Patient instructed to watch for/report side effects such as hot flashes, joint pain, muscle aches. Patient also instructed that this medication can cause osteoporosis and that we are going to do a bone density test in the next week and then periodically during tx with the AI. She said ok.

## 2013-11-12 ENCOUNTER — Ambulatory Visit (HOSPITAL_COMMUNITY): Payer: BC Managed Care – PPO

## 2013-11-28 ENCOUNTER — Other Ambulatory Visit (HOSPITAL_COMMUNITY): Payer: Self-pay | Admitting: Oncology

## 2013-11-29 ENCOUNTER — Other Ambulatory Visit (HOSPITAL_COMMUNITY): Payer: Self-pay | Admitting: Oncology

## 2013-11-29 ENCOUNTER — Telehealth (HOSPITAL_COMMUNITY): Payer: Self-pay

## 2013-11-29 DIAGNOSIS — C50919 Malignant neoplasm of unspecified site of unspecified female breast: Secondary | ICD-10-CM

## 2013-11-29 MED ORDER — TAMOXIFEN CITRATE 10 MG PO TABS: 10.0000 mg | ORAL_TABLET | Freq: Two times a day (BID) | ORAL | Status: AC

## 2013-11-29 NOTE — Telephone Encounter (Signed)
Done

## 2013-11-29 NOTE — Telephone Encounter (Signed)
Is still taking Tamoxifen.  Has coupon for aromasin but has to enter info electronically and has not done yet.  Requests 1 refill for the tamoxifen until she can get the coupon activated.

## 2013-12-12 ENCOUNTER — Encounter: Payer: Self-pay | Admitting: Internal Medicine

## 2013-12-12 ENCOUNTER — Ambulatory Visit (INDEPENDENT_AMBULATORY_CARE_PROVIDER_SITE_OTHER): Payer: BC Managed Care – PPO | Admitting: Internal Medicine

## 2013-12-12 VITALS — BP 110/70 | HR 81 | Temp 98.3°F | Resp 18 | Ht 60.0 in | Wt 122.0 lb

## 2013-12-12 DIAGNOSIS — F3289 Other specified depressive episodes: Secondary | ICD-10-CM

## 2013-12-12 DIAGNOSIS — E039 Hypothyroidism, unspecified: Secondary | ICD-10-CM

## 2013-12-12 DIAGNOSIS — E785 Hyperlipidemia, unspecified: Secondary | ICD-10-CM

## 2013-12-12 DIAGNOSIS — E0500 Thyrotoxicosis with diffuse goiter without thyrotoxic crisis or storm: Secondary | ICD-10-CM

## 2013-12-12 DIAGNOSIS — Z87442 Personal history of urinary calculi: Secondary | ICD-10-CM

## 2013-12-12 DIAGNOSIS — F329 Major depressive disorder, single episode, unspecified: Secondary | ICD-10-CM

## 2013-12-12 MED ORDER — LORAZEPAM 0.5 MG PO TABS: 0.5000 mg | ORAL_TABLET | Freq: Two times a day (BID) | ORAL | Status: DC | PRN

## 2013-12-12 MED ORDER — ESCITALOPRAM OXALATE 10 MG PO TABS: 10.0000 mg | ORAL_TABLET | Freq: Every day | ORAL | Status: DC

## 2013-12-12 NOTE — Progress Notes (Signed)
Pre-visit discussion using our clinic review tool. No additional management support is needed unless otherwise documented below in the visit note.  

## 2013-12-12 NOTE — Progress Notes (Signed)
Subjective:    Patient ID: Renee [name], female    DOB: 05/14/1971, 43 y.o.   MRN: [id number]  HPI  43 year old patient who has a history of Graves' disease and now is supplementals Synthroid.  In August of last year, she lost a stepson and is having a very difficult time dealing with her personal grief and assisting her husband.  She is receiving some assistance with hospice and her pastor , but has considerable anxiety and depression.  She has been on Lexapro in the past after a diagnosis of breast cancer.  She is having some sleep issues.  At times is quite tearful  Past Medical History  Diagnosis Date  . Breast cancer     2010 ; left side mastectomy chemo pill  . Thyroid disease   . Hypothyroidism   . DVT (deep venous thrombosis)     left arm    History   Social History  . Marital Status: Divorced    Spouse Name: N/A    Number of Children: N/A  . Years of Education: N/A   Occupational History  . Not on file.   Social History Main Topics  . Smoking status: Former Smoker -- 0.50 packs/day for 14 years    Quit date: 09/22/2010  . Smokeless tobacco: Never Used  . Alcohol Use: No  . Drug Use: No  . Sexual Activity: Not on file   Other Topics Concern  . Not on file   Social History Narrative  . No narrative on file    Past Surgical History  Procedure Laterality Date  . Mastectomy      left side  . Portacath placement    . Port-a-cath removal    . Breast reconstruction  01/2011    Family History  Problem Relation Age of Onset  . Hypertension Mother   . Diabetes Father   . Stroke Father   . Hypertension Father   . Hypertension Maternal Grandmother   . Diabetes Paternal Grandmother   . Cancer Paternal Grandfather     No Known Allergies  Current Outpatient Prescriptions on File Prior to Visit  Medication Sig Dispense Refill  . levothyroxine (SYNTHROID, LEVOTHROID) 112 MCG tablet Take 1 tablet (112 mcg total) by mouth daily.  90 tablet  3  . Multiple  Vitamins-Minerals (MULTIVITAMINS THER. W/MINERALS) TABS Take 1 tablet by mouth daily.        . naproxen sodium (ANAPROX) 220 MG tablet Take 220 mg by mouth 4 (four) times daily.      Marland Kitchen OVER THE COUNTER MEDICATION Take 1 capsule by mouth. Hair, Skin and Nail vitamin      . tamoxifen (NOLVADEX) 10 MG tablet Take 1 tablet (10 mg total) by mouth 2 (two) times daily.  60 tablet  0  . exemestane (AROMASIN) 25 MG tablet Take 1 tablet (25 mg total) by mouth daily after breakfast.  30 tablet  2   No current facility-administered medications on file prior to visit.    BP 110/70  Pulse 81  Temp(Src) 98.3 F (36.8 C) (Oral)  Resp 18  Ht 5' (1.524 m)  Wt 122 lb (55.339 kg)  BMI 23.83 kg/m2  SpO2 98%       Review of Systems  Psychiatric/Behavioral: Positive for behavioral problems, sleep disturbance, dysphoric mood and decreased concentration. The patient is nervous/anxious.        Objective:   Physical Exam  Constitutional: She appears well-developed and well-nourished. No distress.  110 over 70  Psychiatric:  Anxious.  At times, tearful          Assessment & Plan:   Grief reaction.  Situational stress. Will resume Lexapro and also give a prescription for when necessary lorazepam, which she has found helpful in the past.  She does have a good relationship with a prior counselor that she will resume.  Recheck 6 weeks

## 2014-01-23 ENCOUNTER — Ambulatory Visit (INDEPENDENT_AMBULATORY_CARE_PROVIDER_SITE_OTHER): Payer: 59 | Admitting: Internal Medicine

## 2014-01-23 ENCOUNTER — Encounter: Payer: Self-pay | Admitting: Internal Medicine

## 2014-01-23 VITALS — BP 100/60 | HR 91 | Temp 98.5°F | Resp 18 | Ht 60.0 in | Wt 116.0 lb

## 2014-01-23 DIAGNOSIS — F329 Major depressive disorder, single episode, unspecified: Secondary | ICD-10-CM

## 2014-01-23 DIAGNOSIS — F3289 Other specified depressive episodes: Secondary | ICD-10-CM

## 2014-01-23 MED ORDER — LEVOTHYROXINE SODIUM 112 MCG PO TABS: 112.0000 ug | ORAL_TABLET | Freq: Every day | ORAL | Status: DC

## 2014-01-23 NOTE — Patient Instructions (Signed)
Call or return to clinic prn if these symptoms worsen or fail to improve as anticipated.

## 2014-01-23 NOTE — Progress Notes (Signed)
Subjective:    Patient ID: Renee [name], female    DOB: 30-Mar-1971, 43 y.o.   MRN: [id number]  HPI  43 year old patient who is seen today for followup.  She has a history of depression/grief reaction, death of a stepchild in Mar 17, 2023 of last year.  She continues to receive counseling both from her church and from hospice. 6 weeks ago.  She was placed on Lexapro 10 mg daily and when necessary lorazepam.  No clinical change at this time.  She is still sleeping poorly.  She has used lorazepam only on 2 occasions without much benefit at night.  Past Medical History  Diagnosis Date  . Breast cancer     2010 ; left side mastectomy chemo pill  . Thyroid disease   . Hypothyroidism   . DVT (deep venous thrombosis)     left arm    History   Social History  . Marital Status: Divorced    Spouse Name: N/A    Number of Children: N/A  . Years of Education: N/A   Occupational History  . Not on file.   Social History Main Topics  . Smoking status: Former Smoker -- 0.50 packs/day for 14 years    Quit date: 09/22/2010  . Smokeless tobacco: Never Used  . Alcohol Use: No  . Drug Use: No  . Sexual Activity: Not on file   Other Topics Concern  . Not on file   Social History Narrative  . No narrative on file    Past Surgical History  Procedure Laterality Date  . Mastectomy      left side  . Portacath placement    . Port-a-cath removal    . Breast reconstruction  01/2011    Family History  Problem Relation Age of Onset  . Hypertension Mother   . Diabetes Father   . Stroke Father   . Hypertension Father   . Hypertension Maternal Grandmother   . Diabetes Paternal Grandmother   . Cancer Paternal Grandfather     No Known Allergies  Current Outpatient Prescriptions on File Prior to Visit  Medication Sig Dispense Refill  . escitalopram (LEXAPRO) 10 MG tablet Take 1 tablet (10 mg total) by mouth daily.  90 tablet  1  . LORazepam (ATIVAN) 0.5 MG tablet Take 1 tablet (0.5 mg total)  by mouth 2 (two) times daily as needed for anxiety.  60 tablet  0  . Multiple Vitamins-Minerals (MULTIVITAMINS THER. W/MINERALS) TABS Take 1 tablet by mouth daily.        . naproxen sodium (ANAPROX) 220 MG tablet Take 220 mg by mouth 4 (four) times daily.      Marland Kitchen OVER THE COUNTER MEDICATION Take 1 capsule by mouth. Hair, Skin and Nail vitamin       No current facility-administered medications on file prior to visit.    BP 100/60  Pulse 91  Temp(Src) 98.5 F (36.9 C) (Oral)  Resp 18  Ht 5' (1.524 m)  Wt 116 lb (52.617 kg)  BMI 22.65 kg/m2  SpO2 97%       Review of Systems  Psychiatric/Behavioral: Positive for sleep disturbance, dysphoric mood and decreased concentration. The patient is nervous/anxious.        Objective:   Physical Exam  Constitutional: She appears well-developed and well-nourished. No distress.  Psychiatric: Her behavior is normal. Judgment and thought content normal.  Depressed mood          Assessment & Plan:   Depression/grief reaction.  We'll  increase Lexapro to 20 mg daily.  Will continue counseling.  Will reassess in 6 weeks.  Relaxation techniques discussed

## 2014-01-23 NOTE — Progress Notes (Signed)
Pre-visit discussion using our clinic review tool. No additional management support is needed unless otherwise documented below in the visit note.  

## 2014-02-28 ENCOUNTER — Ambulatory Visit (INDEPENDENT_AMBULATORY_CARE_PROVIDER_SITE_OTHER): Payer: 59 | Admitting: Internal Medicine

## 2014-02-28 ENCOUNTER — Encounter: Payer: Self-pay | Admitting: Internal Medicine

## 2014-02-28 VITALS — BP 104/70 | HR 80 | Temp 98.4°F | Ht 60.0 in | Wt 118.0 lb

## 2014-02-28 DIAGNOSIS — E039 Hypothyroidism, unspecified: Secondary | ICD-10-CM

## 2014-02-28 DIAGNOSIS — E785 Hyperlipidemia, unspecified: Secondary | ICD-10-CM

## 2014-02-28 DIAGNOSIS — F329 Major depressive disorder, single episode, unspecified: Secondary | ICD-10-CM

## 2014-02-28 DIAGNOSIS — F3289 Other specified depressive episodes: Secondary | ICD-10-CM

## 2014-02-28 LAB — LIPID PANEL
Cholesterol: 204 mg/dL — ABNORMAL HIGH (ref 0–200)
HDL: 48.4 mg/dL (ref >39.00)
LDL Cholesterol: 135 mg/dL — ABNORMAL HIGH (ref 0–99)
NonHDL: 155.6
TRIGLYCERIDES: 103 mg/dL (ref 0.0–149.0)
Total CHOL/HDL Ratio: 4
VLDL: 20.6 mg/dL (ref 0.0–40.0)

## 2014-02-28 MED ORDER — BUPROPION HCL ER (XL) 150 MG PO TB24: 150.0000 mg | ORAL_TABLET | Freq: Every day | ORAL | Status: DC

## 2014-02-28 MED ORDER — LORAZEPAM 0.5 MG PO TABS: 1.0000 mg | ORAL_TABLET | Freq: Three times a day (TID) | ORAL | Status: DC | PRN

## 2014-02-28 NOTE — Patient Instructions (Signed)
It is important that you exercise regularly, at least 20 minutes 3 to 4 times per week.  If you develop chest pain or shortness of breath seek  medical attention.   

## 2014-02-28 NOTE — Progress Notes (Signed)
Subjective:    Patient ID: Renee [name], female    DOB: 11-04-1970, 43 y.o.   MRN: [id number]  HPI  43 year old patient who is in today for followup of depression.  She was seen approximately 6 weeks ago, and Lexapro was increased to 20 mg.  She is on lorazepam as needed for sleep, which has been quite helpful.  She remains depressed and at times having difficulty at work with distractibility and inability to focus.  She feels this has affected job performance.  She feels unimproved.  She continues to receive counseling with hospice and also at her church.  She was seen by ophthalmology recently who was concerned about atherosclerotic changes.  Past Medical History  Diagnosis Date  . Breast cancer     2010 ; left side mastectomy chemo pill  . Thyroid disease   . Hypothyroidism   . DVT (deep venous thrombosis)     left arm    History   Social History  . Marital Status: Divorced    Spouse Name: N/A    Number of Children: N/A  . Years of Education: N/A   Occupational History  . Not on file.   Social History Main Topics  . Smoking status: Former Smoker -- 0.50 packs/day for 14 years    Quit date: 09/22/2010  . Smokeless tobacco: Never Used  . Alcohol Use: No  . Drug Use: No  . Sexual Activity: Not on file   Other Topics Concern  . Not on file   Social History Narrative  . No narrative on file    Past Surgical History  Procedure Laterality Date  . Mastectomy      left side  . Portacath placement    . Port-a-cath removal    . Breast reconstruction  01/2011    Family History  Problem Relation Age of Onset  . Hypertension Mother   . Diabetes Father   . Stroke Father   . Hypertension Father   . Hypertension Maternal Grandmother   . Diabetes Paternal Grandmother   . Cancer Paternal Grandfather     No Known Allergies  Current Outpatient Prescriptions on File Prior to Visit  Medication Sig Dispense Refill  . levothyroxine (SYNTHROID, LEVOTHROID) 112 MCG  tablet Take 1 tablet (112 mcg total) by mouth daily.  90 tablet  0  . Multiple Vitamins-Minerals (MULTIVITAMINS THER. W/MINERALS) TABS Take 1 tablet by mouth daily.        Marland Kitchen OVER THE COUNTER MEDICATION Take 1 capsule by mouth. Hair, Skin and Nail vitamin      . tamoxifen (NOLVADEX) 10 MG tablet Take 10 mg by mouth 2 (two) times daily.        No current facility-administered medications on file prior to visit.    BP 104/70  Pulse 80  Temp(Src) 98.4 F (36.9 C) (Oral)  Ht 5' (1.524 m)  Wt 118 lb (53.524 kg)  BMI 23.05 kg/m2      Review of Systems  Constitutional: Negative.   HENT: Negative for congestion, dental problem, hearing loss, rhinorrhea, sinus pressure, sore throat and tinnitus.   Eyes: Negative for pain, discharge and visual disturbance.  Respiratory: Negative for cough and shortness of breath.   Cardiovascular: Negative for chest pain, palpitations and leg swelling.  Gastrointestinal: Negative for nausea, vomiting, abdominal pain, diarrhea, constipation, blood in stool and abdominal distention.  Genitourinary: Negative for dysuria, urgency, frequency, hematuria, flank pain, vaginal bleeding, vaginal discharge, difficulty urinating, vaginal pain and pelvic pain.  Musculoskeletal:  Negative for arthralgias, gait problem and joint swelling.  Skin: Negative for rash.  Neurological: Negative for dizziness, syncope, speech difficulty, weakness, numbness and headaches.  Hematological: Negative for adenopathy.  Psychiatric/Behavioral: Positive for behavioral problems, sleep disturbance, dysphoric mood and decreased concentration. Negative for agitation. The patient is not nervous/anxious.        Objective:   Physical Exam  Constitutional: She appears well-developed and well-nourished. No distress.  Psychiatric:  Depressed mood          Assessment & Plan:   Clinical depression.  No clinical improvement.  Options discussed, including psychiatric referral.  Have elected  to augment with Wellbutrin and recheck in 2 months.  Patient will call the office for referral if desired  Preventive health.  We'll check a lipid profile

## 2014-02-28 NOTE — Progress Notes (Signed)
Pre visit review using our clinic review tool, if applicable. No additional management support is needed unless otherwise documented below in the visit note. 

## 2014-03-06 ENCOUNTER — Ambulatory Visit: Payer: 59 | Admitting: Internal Medicine

## 2014-03-06 ENCOUNTER — Telehealth: Payer: Self-pay | Admitting: Internal Medicine

## 2014-03-06 NOTE — Telephone Encounter (Signed)
Pt request results of lab done 7/23. Pt states ok to leave message on VM

## 2014-03-07 NOTE — Telephone Encounter (Signed)
Pt called back, told her Dr. Raliegh Ip is out of the office this week and has not reviewed labs. Explained lipid panel results to pt, Cholesterol was 204, LDL was 135 both elevated. Pt verbalized understanding.

## 2014-03-09 NOTE — Progress Notes (Signed)
-  No show-  Seymone Forlenza  

## 2014-03-11 ENCOUNTER — Ambulatory Visit (HOSPITAL_COMMUNITY): Payer: BC Managed Care – PPO | Admitting: Oncology

## 2014-04-02 NOTE — Progress Notes (Signed)
Renee Cowden, MD [place] [address]  Infiltrating ductal carcinoma of breast, left - Plan: anastrozole (ARIMIDEX) 1 MG tablet, MM Digital Diagnostic Bilat, DISCONTINUED: anastrozole (ARIMIDEX) 1 MG tablet  CURRENT THERAPY: On Tamoxifen beginning on 06/23/2010 and she will take for 10 years (versus a switch to an AI when post-menopausal).    INTERVAL HISTORY: Renee [name] 43 y.o. 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 Both metastases greater than 3 mm but she also had extracapsular extension. No LV I was seen. ER +96%, PR +11%, Ki-67 20%, HER-2/neu nonamplified.  Date of surgery was on 10/08/2009. She had dose dense epirubicin/Cytoxan for 4 cycles followed by weekly Taxol for 12 weeks followed by radiation therapy by Dr. Kyung Rudd finishing on 06/19/2010. She then started tamoxifen on 06/23/2010 and it was proposed that she will continue this for 10 years, but her hormone status demonstrates a post-menopausal state and therefore, she would benefit from a switch to an AI.  I personally reviewed and went over laboratory results with the patient.  The results are noted within this dictation. 6 months ago, she was determined to be post-menopausal by estradiol, FSH, and LH levels.  As a result, she would benefit from a switch to an Aromatase inhibitor.  I personally reviewed and went over radiographic studies with the patient.  The results are noted within this dictation.  Her last mammogram was in April 2014 and she is therefore overdue for her next screening mammogram.  She shows me a "new lump" that is concerning her on her left breast.  See exam below.  We will get her set-up for a diagnostic mammogram.  She reports that she could not afford Aromasin: "It was $500."  Therefore, I have given her a new Rx for  Arimidex.  This should be more financially feasible for her.  She is seeing Dr. Burnice Logan with [facility] and he is working on the patient's reactive depression which the patient really needs.  I will defer all anti-depressant treatment to him.  She reports that she feels great.  She continues to have "tough days" associated with reactive depression from the loss of her step-son 1 year ago.  Otherwise, oncologically, she denies any complaints and ROS questioning is negative.  Past Medical History  Diagnosis Date  . Breast cancer     2010 ; left side mastectomy chemo pill  . Thyroid disease   . Hypothyroidism   . 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     has No Known Allergies.  Ms. Hitchman had no medications administered during this visit.  Past Surgical History  Procedure Laterality Date  . Mastectomy      left side  . Portacath placement    . Port-a-cath removal    . Breast reconstruction  01/2011    Denies any headaches, dizziness, double vision, fevers, chills, night sweats, nausea, vomiting, diarrhea, constipation, chest pain, heart palpitations, shortness of breath, blood in stool, black tarry stool, urinary pain, urinary burning, urinary frequency, hematuria.   PHYSICAL EXAMINATION  ECOG PERFORMANCE STATUS: 0 - Asymptomatic  Filed Vitals:   04/03/14 0915  BP: 115/87  Pulse: 81  Temp: 98.1 F (36.7 C)  Resp: 18    GENERAL:alert, healthy, no distress, well nourished, well developed, comfortable, cooperative and smiling SKIN: skin color, texture, turgor are normal, no rashes or significant lesions HEAD: Normocephalic, No masses, lesions, tenderness or abnormalities EYES: normal, PERRLA, EOMI,  Conjunctiva are pink and non-injected EARS: External ears normal OROPHARYNX:lips, buccal mucosa, and tongue normal and mucous membranes are moist  NECK: supple, no adenopathy, thyroid normal size, non-tender, without nodularity, no stridor, non-tender, trachea midline LYMPH:  no palpable lymphadenopathy, no hepatosplenomegaly BREAST:right breast normal without mass, skin or nipple changes or axillary nodes, left implant, with a small, 0.5 cm nodule in the 9 o'clock position following the scar line of her reconstruction very medial. LUNGS: clear to auscultation and percussion HEART: regular rate & rhythm, no murmurs, no gallops, S1 normal and S2 normal ABDOMEN:abdomen soft, non-tender, normal bowel sounds, no masses or organomegaly and no hepatosplenomegaly BACK: Back symmetric, no curvature., No CVA tenderness EXTREMITIES:less then 2 second capillary refill, no joint deformities, effusion, or inflammation, no edema, no skin discoloration, no clubbing, no cyanosis  NEURO: alert & oriented x 3 with fluent speech, no focal motor/sensory deficits, gait normal   LABORATORY DATA: CBC    Component Value Date/Time   WBC 8.3 09/11/2013 1218   RBC 4.17 09/11/2013 1218   HGB 14.5 09/11/2013 1218   HCT 42.1 09/11/2013 1218   PLT 164 09/11/2013 1218   MCV 101.0* 09/11/2013 1218   MCH 34.8* 09/11/2013 1218   MCHC 34.4 09/11/2013 1218   RDW 13.5 09/11/2013 1218   LYMPHSABS 2.8 09/11/2013 1218   MONOABS 0.5 09/11/2013 1218   EOSABS 0.1 09/11/2013 1218   BASOSABS 0.0 09/11/2013 1218      Chemistry      Component Value Date/Time   NA 141 09/11/2013 1218   K 4.2 09/11/2013 1218   CL 99 09/11/2013 1218   CO2 30 09/11/2013 1218   BUN 15 09/11/2013 1218   CREATININE 0.89 09/11/2013 1218      Component Value Date/Time   CALCIUM 9.6 09/11/2013 1218   ALKPHOS 60 09/11/2013 1218   AST 19 09/11/2013 1218   ALT 12 09/11/2013 1218   BILITOT 0.2* 09/11/2013 1218     Results for Renee [name], Renee [name] (MRN [id number]) as of 03/09/2014 08:24  Ref. Range  09/11/2013 12:18  LH No range found 19.9  FSH No range found 41.9   Results for Renee [name], Renee [name] (MRN [id number]) as of 03/09/2014 08:24  Ref. Range 09/11/2013 12:18  Estradiol No range found 16.2      ASSESSMENT:  1. 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 Both metastases greater than 3 mm but she also had extracapsular extension. No LV I was seen. ER +96%, PR +11%, Ki-67 20%, HER-2/neu nonamplified.  Date of surgery was on 10/08/2009. She had dose dense epirubicin/Cytoxan for 4 cycles followed by weekly Taxol for 12 weeks followed by radiation therapy by Dr. Kyung Rudd finishing on 06/19/2010. She then started tamoxifen on 06/23/2010 and it was proposed that she will continue this for 10 years, but her hormone status demonstrates a post-menopausal state and therefore, she would benefit from a switch to an AI. 2. Post-menopausal by lab testing in Feb 2015. 3. Breast reconstruction by Dr. Theodoro Kos  4. Superficial phlebitis of the left forearm. Upon  reviewing the ultrasound she did not have any evidence for DVT. Therefore, Xarelto was stopped and she was treated symptomatically with Aleve x 2 weeks with resolution. 5. Hypothyroidism on Synthroid replacement.  6. 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  Patient Active Problem List   Diagnosis Date Noted  . DVT of upper extremity (deep vein thrombosis) 09/12/2012  . 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 Thyroid disease   . HYPOTHYROIDISM 02/24/2009  . [place] DISEASE 04/26/2008  . HYPERLIPIDEMIA 04/01/2008  . ANEMIA-IRON DEFICIENCY 04/01/2008  . DEPRESSION 04/01/2008  . TREMOR 04/01/2008  . NEPHROLITHIASIS, HX OF 04/01/2008     PLAN:  1. I personally reviewed and went over laboratory results with the patient.  The results are noted within this  dictation. 2. I personally reviewed and went over radiographic studies with the patient.  The results are noted within this dictation.   3. B/L diagnostic mammogram 4. Labs today: CBC diff, CMET, Anemia panel 5. Patient education regarding estradiol, LH, and FSH levels indicative of being post-menopausal.  As a result, she would benefit from switching to an AI. 6. Patient education regarding AI.  Discussed the risks, benefits, alternatives, and side effects including, but not limited to, hot flashes, arthralgias, myalgias, decreased bone density, and secondary malignancy. 7. Rx for Arimidex.  Aromasin was too expensive for her 8. Review of NCCN guidelines pertaining to endocrine therapy. 9. Return in 6 weeks for follow-up.  Sooner if mammogram is abnormal.  She will need a baseline bone density in the near future, and this will be discussed at her next office visit along with new labs.   THERAPY PLAN:  I have given her an Rx for Arimidex.  I will get her scheduled for a diagnostic mammogram.  She will return in 6 weeks for repeat labs and discussion regarding data and scheduling of a baseline bone density.  All questions were answered. The patient knows to call the clinic with any problems, questions or concerns. We can certainly see the patient much sooner if necessary.  Patient and plan discussed with Dr. Farrel Gobble and he is in agreement with the aforementioned.   KEFALAS,THOMAS 04/03/2014

## 2014-04-03 ENCOUNTER — Encounter (HOSPITAL_COMMUNITY): Payer: Self-pay | Admitting: Oncology

## 2014-04-03 ENCOUNTER — Encounter (HOSPITAL_BASED_OUTPATIENT_CLINIC_OR_DEPARTMENT_OTHER): Payer: 59

## 2014-04-03 ENCOUNTER — Encounter (HOSPITAL_COMMUNITY): Payer: 59 | Attending: Oncology | Admitting: Oncology

## 2014-04-03 VITALS — BP 115/87 | HR 81 | Temp 98.1°F | Resp 18 | Wt 119.5 lb

## 2014-04-03 DIAGNOSIS — C50919 Malignant neoplasm of unspecified site of unspecified female breast: Secondary | ICD-10-CM | POA: Diagnosis present

## 2014-04-03 DIAGNOSIS — E039 Hypothyroidism, unspecified: Secondary | ICD-10-CM | POA: Insufficient documentation

## 2014-04-03 DIAGNOSIS — I808 Phlebitis and thrombophlebitis of other sites: Secondary | ICD-10-CM | POA: Diagnosis not present

## 2014-04-03 DIAGNOSIS — R259 Unspecified abnormal involuntary movements: Secondary | ICD-10-CM | POA: Diagnosis not present

## 2014-04-03 DIAGNOSIS — Z9010 Acquired absence of unspecified breast and nipple: Secondary | ICD-10-CM | POA: Diagnosis not present

## 2014-04-03 DIAGNOSIS — Z79899 Other long term (current) drug therapy: Secondary | ICD-10-CM | POA: Insufficient documentation

## 2014-04-03 DIAGNOSIS — N200 Calculus of kidney: Secondary | ICD-10-CM | POA: Diagnosis not present

## 2014-04-03 DIAGNOSIS — Z9889 Other specified postprocedural states: Secondary | ICD-10-CM | POA: Insufficient documentation

## 2014-04-03 DIAGNOSIS — E785 Hyperlipidemia, unspecified: Secondary | ICD-10-CM | POA: Insufficient documentation

## 2014-04-03 DIAGNOSIS — Z170 Estrogen receptor positive status [ER+]: Secondary | ICD-10-CM | POA: Insufficient documentation

## 2014-04-03 DIAGNOSIS — D7589 Other specified diseases of blood and blood-forming organs: Secondary | ICD-10-CM

## 2014-04-03 DIAGNOSIS — D509 Iron deficiency anemia, unspecified: Secondary | ICD-10-CM

## 2014-04-03 DIAGNOSIS — F341 Dysthymic disorder: Secondary | ICD-10-CM | POA: Insufficient documentation

## 2014-04-03 DIAGNOSIS — C50912 Malignant neoplasm of unspecified site of left female breast: Secondary | ICD-10-CM

## 2014-04-03 DIAGNOSIS — E0500 Thyrotoxicosis with diffuse goiter without thyrotoxic crisis or storm: Secondary | ICD-10-CM | POA: Diagnosis not present

## 2014-04-03 LAB — CBC WITH DIFFERENTIAL/PLATELET
Basophils Absolute: 0 10*3/uL (ref 0.0–0.1)
Basophils Relative: 0 % (ref 0–1)
Eosinophils Absolute: 0.1 10*3/uL (ref 0.0–0.7)
Eosinophils Relative: 1 % (ref 0–5)
HCT: 40.2 % (ref 36.0–46.0)
Hemoglobin: 13.6 g/dL (ref 12.0–15.0)
LYMPHS ABS: 1.7 10*3/uL (ref 0.7–4.0)
Lymphocytes Relative: 24 % (ref 12–46)
MCH: 34.3 pg — ABNORMAL HIGH (ref 26.0–34.0)
MCHC: 33.8 g/dL (ref 30.0–36.0)
MCV: 101.3 fL — AB (ref 78.0–100.0)
Monocytes Absolute: 0.4 10*3/uL (ref 0.1–1.0)
Monocytes Relative: 6 % (ref 3–12)
NEUTROS PCT: 69 % (ref 43–77)
Neutro Abs: 4.8 10*3/uL (ref 1.7–7.7)
Platelets: 149 10*3/uL — ABNORMAL LOW (ref 150–400)
RBC: 3.97 MIL/uL (ref 3.87–5.11)
RDW: 13.3 % (ref 11.5–15.5)
WBC: 7 10*3/uL (ref 4.0–10.5)

## 2014-04-03 LAB — COMPREHENSIVE METABOLIC PANEL
ALK PHOS: 53 U/L (ref 39–117)
ALT: 9 U/L (ref 0–35)
ANION GAP: 10 (ref 5–15)
AST: 18 U/L (ref 0–37)
Albumin: 3.8 g/dL (ref 3.5–5.2)
BILIRUBIN TOTAL: 0.4 mg/dL (ref 0.3–1.2)
BUN: 15 mg/dL (ref 6–23)
CO2: 27 mEq/L (ref 19–32)
Calcium: 9.2 mg/dL (ref 8.4–10.5)
Chloride: 104 mEq/L (ref 96–112)
Creatinine, Ser: 0.67 mg/dL (ref 0.50–1.10)
GFR calc non Af Amer: >90 mL/min (ref >90)
Glucose, Bld: 84 mg/dL (ref 70–99)
POTASSIUM: 4.4 meq/L (ref 3.7–5.3)
Sodium: 141 mEq/L (ref 137–147)
TOTAL PROTEIN: 7 g/dL (ref 6.0–8.3)

## 2014-04-03 LAB — IRON AND TIBC
Iron: 144 ug/dL — ABNORMAL HIGH (ref 42–135)
SATURATION RATIOS: 45 % (ref 20–55)
TIBC: 317 ug/dL (ref 250–470)
UIBC: 173 ug/dL (ref 125–400)

## 2014-04-03 LAB — FOLATE: Folate: >20 ng/mL

## 2014-04-03 LAB — FERRITIN: Ferritin: 42 ng/mL (ref 10–291)

## 2014-04-03 LAB — VITAMIN B12: Vitamin B-12: 522 pg/mL (ref 211–911)

## 2014-04-03 MED ORDER — ANASTROZOLE 1 MG PO TABS: 1.0000 mg | ORAL_TABLET | Freq: Every day | ORAL | Status: DC

## 2014-04-03 NOTE — Patient Instructions (Signed)
[facility] Discharge Instructions  RECOMMENDATIONS MADE BY THE CONSULTANT AND ANY TEST RESULTS WILL BE SENT TO YOUR REFERRING PHYSICIAN.  Return in 6 weeks for follow-up with Tom. Mammogram as scheduled.  Thank you for choosing [facility] to provide your oncology and hematology care.  To afford each patient quality time with our providers, please arrive at least 15 minutes before your scheduled appointment time.  With your help, our goal is to use those 15 minutes to complete the necessary work-up to ensure our physicians have the information they need to help with your evaluation and healthcare recommendations.    Effective January 1st, 2014, we ask that you re-schedule your appointment with our physicians should you arrive 10 or more minutes late for your appointment.  We strive to give you quality time with our providers, and arriving late affects you and other patients whose appointments are after yours.    Again, thank you for choosing [facility].  Our hope is that these requests will decrease the amount of time that you wait before being seen by our physicians.       _____________________________________________________________  Should you have questions after your visit to [facility], please contact our office at [phone] between the hours of 8:30 a.m. and 4:30 p.m.  Voicemails left after 4:30 p.m. will not be returned until the following business day.  For prescription refill requests, have your pharmacy contact our office with your prescription refill request.    _______________________________________________________________  We hope that we have given you very good care.  You may receive a patient satisfaction survey in the mail, please complete it and return it as soon as possible.  We value your feedback!  _______________________________________________________________  Have you asked about our STAR program?   STAR stands for Survivorship Training and Rehabilitation, and this is a nationally recognized cancer care program that focuses on survivorship and rehabilitation.  Cancer and cancer treatments may cause problems, such as, pain, making you feel tired and keeping you from doing the things that you need or want to do. Cancer rehabilitation can help. Our goal is to reduce these troubling effects and help you have the best quality of life possible.  You may receive a survey from a nurse that asks questions about your current state of health.  Based on the survey results, all eligible patients will be referred to the [facility] program for an evaluation so we can better serve you!  A frequently asked questions sheet is available upon request.

## 2014-04-03 NOTE — Progress Notes (Signed)
LABS FOR CBCD,CMP,B12,FOL,IRON/TIBC,FERR

## 2014-04-08 ENCOUNTER — Encounter: Payer: Self-pay | Admitting: Internal Medicine

## 2014-04-08 ENCOUNTER — Ambulatory Visit (INDEPENDENT_AMBULATORY_CARE_PROVIDER_SITE_OTHER): Payer: 59 | Admitting: Internal Medicine

## 2014-04-08 VITALS — BP 119/74 | HR 76 | Temp 98.5°F | Resp 18 | Ht 60.0 in | Wt 122.0 lb

## 2014-04-08 DIAGNOSIS — B9789 Other viral agents as the cause of diseases classified elsewhere: Principal | ICD-10-CM

## 2014-04-08 DIAGNOSIS — J069 Acute upper respiratory infection, unspecified: Secondary | ICD-10-CM

## 2014-04-08 MED ORDER — HYDROCODONE-ACETAMINOPHEN 5-325 MG PO TABS: 1.0000 | ORAL_TABLET | Freq: Four times a day (QID) | ORAL | Status: DC | PRN

## 2014-04-08 MED ORDER — HYDROCODONE-ACETAMINOPHEN 2.5-325 MG PO TABS: 1.0000 | ORAL_TABLET | Freq: Four times a day (QID) | ORAL | Status: DC | PRN

## 2014-04-08 NOTE — Patient Instructions (Signed)
Acute bronchitis symptoms for less than 10 days are generally not helped by antibiotics.  Take over-the-counter expectorants and cough medications such as  Mucinex DM.  Call if there is no improvement in 5 to 7 days or if  you develop worsening cough, fever, or new symptoms, such as shortness of breath or chest pain.   

## 2014-04-08 NOTE — Progress Notes (Signed)
Pre visit review using our clinic review tool, if applicable. No additional management support is needed unless otherwise documented below in the visit note. 

## 2014-04-08 NOTE — Progress Notes (Signed)
Subjective:    Patient ID: Renee [name], female    DOB: Jan 10, 1971, 43 y.o.   MRN: [id number]  HPI  43 year old patient who is in with a three-day history of cough, congestion, and general sense of wellness.  She has had low-grade fever and has been using Aleve.  She feels quite weak and unable to work due to the acute illness.  No wheezing or shortness of breath.  Past Medical History  Diagnosis Date  . Breast cancer     2010 ; left side mastectomy chemo pill  . Thyroid disease   . Hypothyroidism   . DVT (deep venous thrombosis)     left arm    History   Social History  . Marital Status: Divorced    Spouse Name: N/A    Number of Children: N/A  . Years of Education: N/A   Occupational History  . Not on file.   Social History Main Topics  . Smoking status: Former Smoker -- 0.50 packs/day for 14 years    Quit date: 09/22/2010  . Smokeless tobacco: Never Used  . Alcohol Use: No  . Drug Use: No  . Sexual Activity: Not on file   Other Topics Concern  . Not on file   Social History Narrative  . No narrative on file    Past Surgical History  Procedure Laterality Date  . Mastectomy      left side  . Portacath placement    . Port-a-cath removal    . Breast reconstruction  01/2011    Family History  Problem Relation Age of Onset  . Hypertension Mother   . Diabetes Father   . Stroke Father   . Hypertension Father   . Hypertension Maternal Grandmother   . Diabetes Paternal Grandmother   . Cancer Paternal Grandfather     No Known Allergies  Current Outpatient Prescriptions on File Prior to Visit  Medication Sig Dispense Refill  . anastrozole (ARIMIDEX) 1 MG tablet Take 1 tablet (1 mg total) by mouth daily.  30 tablet  2  . buPROPion (WELLBUTRIN XL) 150 MG 24 hr tablet Take 1 tablet (150 mg total) by mouth daily.  60 tablet  2  . escitalopram (LEXAPRO) 10 MG tablet Take 10 mg by mouth 2 (two) times daily.       Marland Kitchen levothyroxine (SYNTHROID, LEVOTHROID) 112 MCG  tablet Take 1 tablet (112 mcg total) by mouth daily.  90 tablet  0  . LORazepam (ATIVAN) 0.5 MG tablet Take 2 tablets (1 mg total) by mouth every 8 (eight) hours as needed for anxiety.  60 tablet  0  . Multiple Vitamins-Minerals (MULTIVITAMINS THER. W/MINERALS) TABS Take 1 tablet by mouth daily.        Marland Kitchen OVER THE COUNTER MEDICATION Take 1 capsule by mouth. Hair, Skin and Nail vitamin      . tamoxifen (NOLVADEX) 10 MG tablet Take 10 mg by mouth 2 (two) times daily.        No current facility-administered medications on file prior to visit.    BP 119/74  Pulse 76  Temp(Src) 98.5 F (36.9 C) (Oral)  Resp 18  Ht 5' (1.524 m)  Wt 122 lb (55.339 kg)  BMI 23.83 kg/m2  SpO2 98%       Review of Systems  Constitutional: Positive for activity change, appetite change and fatigue.  HENT: Positive for congestion, rhinorrhea and sinus pressure. Negative for dental problem, hearing loss, sore throat and tinnitus.   Eyes: Negative  for pain, discharge and visual disturbance.  Respiratory: Positive for cough. Negative for shortness of breath.   Cardiovascular: Negative for chest pain, palpitations and leg swelling.  Gastrointestinal: Negative for nausea, vomiting, abdominal pain, diarrhea, constipation, blood in stool and abdominal distention.  Genitourinary: Negative for dysuria, urgency, frequency, hematuria, flank pain, vaginal bleeding, vaginal discharge, difficulty urinating, vaginal pain and pelvic pain.  Musculoskeletal: Negative for arthralgias, gait problem and joint swelling.  Skin: Negative for rash.  Neurological: Positive for weakness. Negative for dizziness, syncope, speech difficulty, numbness and headaches.  Hematological: Negative for adenopathy.  Psychiatric/Behavioral: Negative for behavioral problems, dysphoric mood and agitation. The patient is not nervous/anxious.        Objective:   Physical Exam  Constitutional: She is oriented to person, place, and time. She appears  well-developed and well-nourished.  Appears unwell, but in no acute distress   HENT:  Head: Normocephalic.  Right Ear: External ear normal.  Left Ear: External ear normal.  Mouth/Throat: Oropharynx is clear and moist.  Eyes: Conjunctivae and EOM are normal. Pupils are equal, round, and reactive to light.  Neck: Normal range of motion. Neck supple. No thyromegaly present.  Cardiovascular: Normal rate, regular rhythm, normal heart sounds and intact distal pulses.   Pulmonary/Chest: Effort normal and breath sounds normal. No respiratory distress. She has no wheezes. She has no rales (and and he's talking about doing something in September of next year and in some kind of job share he's been retired in November.  He can come back and).  Abdominal: Soft. Bowel sounds are normal. She exhibits no mass. There is no tenderness.  Musculoskeletal: Normal range of motion.  Lymphadenopathy:    She has no cervical adenopathy.  Neurological: She is alert and oriented to person, place, and time.  Skin: Skin is warm and dry. No rash noted.  Psychiatric: She has a normal mood and affect. Her behavior is normal.          Assessment & Plan:  Viral  URI with cough.  We'll treat symptomatically.  A note to excuse from work for the next 2 days dictated and dispensed Return here as scheduled for followup

## 2014-04-10 ENCOUNTER — Encounter: Payer: Self-pay | Admitting: Internal Medicine

## 2014-04-10 ENCOUNTER — Ambulatory Visit (INDEPENDENT_AMBULATORY_CARE_PROVIDER_SITE_OTHER)
Admission: RE | Admit: 2014-04-10 | Discharge: 2014-04-10 | Disposition: A | Discharge status: Home / Self Care | Payer: 59 | Source: Ambulatory Visit | Attending: Internal Medicine | Admitting: Internal Medicine

## 2014-04-10 ENCOUNTER — Telehealth: Payer: Self-pay | Admitting: Internal Medicine

## 2014-04-10 ENCOUNTER — Ambulatory Visit (INDEPENDENT_AMBULATORY_CARE_PROVIDER_SITE_OTHER): Payer: 59 | Admitting: Internal Medicine

## 2014-04-10 VITALS — BP 114/80 | HR 77 | Temp 98.8°F | Resp 18 | Ht 60.0 in | Wt 119.0 lb

## 2014-04-10 DIAGNOSIS — J069 Acute upper respiratory infection, unspecified: Secondary | ICD-10-CM

## 2014-04-10 DIAGNOSIS — B9789 Other viral agents as the cause of diseases classified elsewhere: Principal | ICD-10-CM

## 2014-04-10 DIAGNOSIS — R5381 Other malaise: Secondary | ICD-10-CM

## 2014-04-10 DIAGNOSIS — R5383 Other fatigue: Secondary | ICD-10-CM

## 2014-04-10 DIAGNOSIS — R531 Weakness: Secondary | ICD-10-CM

## 2014-04-10 LAB — COMPREHENSIVE METABOLIC PANEL
ALT: 11 U/L (ref 0–35)
AST: 21 U/L (ref 0–37)
Albumin: 3.8 g/dL (ref 3.5–5.2)
Alkaline Phosphatase: 55 U/L (ref 39–117)
BILIRUBIN TOTAL: 0.5 mg/dL (ref 0.2–1.2)
BUN: 8 mg/dL (ref 6–23)
CO2: 28 mEq/L (ref 19–32)
Calcium: 9.4 mg/dL (ref 8.4–10.5)
Chloride: 102 mEq/L (ref 96–112)
Creatinine, Ser: 0.8 mg/dL (ref 0.4–1.2)
GFR: 87.66 mL/min (ref >60.00)
GLUCOSE: 76 mg/dL (ref 70–99)
Potassium: 3.9 mEq/L (ref 3.5–5.1)
SODIUM: 137 meq/L (ref 135–145)
Total Protein: 6.9 g/dL (ref 6.0–8.3)

## 2014-04-10 LAB — CBC WITH DIFFERENTIAL/PLATELET
BASOS ABS: 0 10*3/uL (ref 0.0–0.1)
Basophils Relative: 0.3 % (ref 0.0–3.0)
EOS ABS: 0.1 10*3/uL (ref 0.0–0.7)
Eosinophils Relative: 1.1 % (ref 0.0–5.0)
HCT: 39.5 % (ref 36.0–46.0)
Hemoglobin: 13.1 g/dL (ref 12.0–15.0)
LYMPHS ABS: 1.9 10*3/uL (ref 0.7–4.0)
Lymphocytes Relative: 26.6 % (ref 12.0–46.0)
MCHC: 33.2 g/dL (ref 30.0–36.0)
MCV: 102.4 fl — AB (ref 78.0–100.0)
MONO ABS: 0.6 10*3/uL (ref 0.1–1.0)
Monocytes Relative: 7.7 % (ref 3.0–12.0)
NEUTROS PCT: 64.3 % (ref 43.0–77.0)
Neutro Abs: 4.6 10*3/uL (ref 1.4–7.7)
PLATELETS: 152 10*3/uL (ref 150.0–400.0)
RBC: 3.86 Mil/uL — ABNORMAL LOW (ref 3.87–5.11)
RDW: 14.1 % (ref 11.5–15.5)
WBC: 7.2 10*3/uL (ref 4.0–10.5)

## 2014-04-10 MED ORDER — AZITHROMYCIN 250 MG PO TABS: ORAL_TABLET | ORAL | Status: DC

## 2014-04-10 NOTE — Telephone Encounter (Signed)
Pt would like xray results. 

## 2014-04-10 NOTE — Progress Notes (Signed)
Pre visit review using our clinic review tool, if applicable. No additional management support is needed unless otherwise documented below in the visit note. 

## 2014-04-10 NOTE — Patient Instructions (Signed)
Take over-the-counter expectorants and cough medications such as  Mucinex DM.  Call if there is no improvement in 5 to 7 days or if  you develop worsening cough, fever, or new symptoms, such as shortness of breath or chest pain.  Take your antibiotic as prescribed until ALL of it is gone, but stop if you develop a rash, swelling, or any side effects of the medication.  Contact our office as soon as possible if  there are side effects of the medication. 

## 2014-04-11 ENCOUNTER — Encounter: Payer: Self-pay | Admitting: Internal Medicine

## 2014-04-11 NOTE — Telephone Encounter (Signed)
Pt wanting lab results, is aware of Chest x-ray results. Please advise.

## 2014-04-11 NOTE — Progress Notes (Signed)
Subjective:    Patient ID: Renee [name], female    DOB: 11/07/1970, 43 y.o.   MRN: [id number]  HPI 43 year old patient who is seen today in followup.  She has been seen and evaluated her recently for a viral URI with cough.  She has not improved with symptomatic therapy.  She continues to cough and remains quite weak and unable to work.  She requires a note to excuse her from work.  No documented fever or chills.  She has been on Mucinex.  Past Medical History  Diagnosis Date  . Breast cancer     2010 ; left side mastectomy chemo pill  . Thyroid disease   . Hypothyroidism   . DVT (deep venous thrombosis)     left arm    History   Social History  . Marital Status: Divorced    Spouse Name: N/A    Number of Children: N/A  . Years of Education: N/A   Occupational History  . Not on file.   Social History Main Topics  . Smoking status: Former Smoker -- 0.50 packs/day for 14 years    Quit date: 09/22/2010  . Smokeless tobacco: Never Used  . Alcohol Use: No  . Drug Use: No  . Sexual Activity: Not on file   Other Topics Concern  . Not on file   Social History Narrative  . No narrative on file    Past Surgical History  Procedure Laterality Date  . Mastectomy      left side  . Portacath placement    . Port-a-cath removal    . Breast reconstruction  01/2011    Family History  Problem Relation Age of Onset  . Hypertension Mother   . Diabetes Father   . Stroke Father   . Hypertension Father   . Hypertension Maternal Grandmother   . Diabetes Paternal Grandmother   . Cancer Paternal Grandfather     No Known Allergies  Current Outpatient Prescriptions on File Prior to Visit  Medication Sig Dispense Refill  . anastrozole (ARIMIDEX) 1 MG tablet Take 1 tablet (1 mg total) by mouth daily.  30 tablet  2  . buPROPion (WELLBUTRIN XL) 150 MG 24 hr tablet Take 1 tablet (150 mg total) by mouth daily.  60 tablet  2  . escitalopram (LEXAPRO) 10 MG tablet Take 10 mg by mouth  2 (two) times daily.       Marland Kitchen HYDROcodone-acetaminophen (NORCO/VICODIN) 5-325 MG per tablet Take 1 tablet by mouth every 6 (six) hours as needed for moderate pain.  30 tablet  0  . levothyroxine (SYNTHROID, LEVOTHROID) 112 MCG tablet Take 1 tablet (112 mcg total) by mouth daily.  90 tablet  0  . LORazepam (ATIVAN) 0.5 MG tablet Take 2 tablets (1 mg total) by mouth every 8 (eight) hours as needed for anxiety.  60 tablet  0  . Multiple Vitamins-Minerals (MULTIVITAMINS THER. W/MINERALS) TABS Take 1 tablet by mouth daily.        Marland Kitchen OVER THE COUNTER MEDICATION Take 1 capsule by mouth. Hair, Skin and Nail vitamin      . tamoxifen (NOLVADEX) 10 MG tablet Take 10 mg by mouth 2 (two) times daily.        No current facility-administered medications on file prior to visit.    BP 114/80  Pulse 77  Temp(Src) 98.8 F (37.1 C) (Oral)  Resp 18  Ht 5' (1.524 m)  Wt 119 lb (53.978 kg)  BMI 23.24 kg/m2  SpO2  93%      Review of Systems  Constitutional: Positive for activity change, appetite change and fatigue. Negative for fever.  HENT: Negative for congestion, dental problem, hearing loss, rhinorrhea, sinus pressure, sore throat and tinnitus.   Eyes: Negative for pain, discharge and visual disturbance.  Respiratory: Positive for cough. Negative for shortness of breath.   Cardiovascular: Negative for chest pain, palpitations and leg swelling.  Gastrointestinal: Negative for nausea, vomiting, abdominal pain, diarrhea, constipation, blood in stool and abdominal distention.  Genitourinary: Negative for dysuria, urgency, frequency, hematuria, flank pain, vaginal bleeding, vaginal discharge, difficulty urinating, vaginal pain and pelvic pain.  Musculoskeletal: Negative for arthralgias, gait problem and joint swelling.  Skin: Negative for rash.  Neurological: Positive for weakness. Negative for dizziness, syncope, speech difficulty, numbness and headaches.  Hematological: Negative for adenopathy.    Psychiatric/Behavioral: Negative for behavioral problems, dysphoric mood and agitation. The patient is not nervous/anxious.        Objective:   Physical Exam  Constitutional: She is oriented to person, place, and time. She appears well-developed and well-nourished. No distress.  Appears unwell and weak, but in no acute distress.  Afebrile  HENT:  Head: Normocephalic.  Right Ear: External ear normal.  Left Ear: External ear normal.  Mouth/Throat: Oropharynx is clear and moist.  Eyes: Conjunctivae and EOM are normal. Pupils are equal, round, and reactive to light.  Neck: Normal range of motion. Neck supple. No thyromegaly present.  Cardiovascular: Normal rate, regular rhythm, normal heart sounds and intact distal pulses.   Pulmonary/Chest: Effort normal.  Scattered coarse rhonchi, especially right lower lobe  Abdominal: Soft. Bowel sounds are normal. She exhibits no mass. There is no tenderness.  Musculoskeletal: Normal range of motion.  Lymphadenopathy:    She has no cervical adenopathy.  Neurological: She is alert and oriented to person, place, and time.  Skin: Skin is warm and dry. No rash noted.  Psychiatric: She has a normal mood and affect. Her behavior is normal.          Assessment & Plan:   Acute bronchitis.  Rule out right lower lobe pneumonia.  Lab and chest x-ray will be reviewed.  She will be treated with azithromycin.  A note for work to excuse until 9/ 8 was dictated and

## 2014-04-12 NOTE — Telephone Encounter (Signed)
Left message on voicemail to call office.  

## 2014-04-12 NOTE — Telephone Encounter (Signed)
Please call/notify patient that lab/test/procedure is normal 

## 2014-04-16 ENCOUNTER — Ambulatory Visit (HOSPITAL_COMMUNITY)
Admission: RE | Admit: 2014-04-16 | Discharge: 2014-04-16 | Disposition: A | Discharge status: Home / Self Care | Payer: 59 | Source: Ambulatory Visit | Attending: Oncology | Admitting: Oncology

## 2014-04-16 ENCOUNTER — Other Ambulatory Visit (HOSPITAL_COMMUNITY): Payer: Self-pay | Admitting: Oncology

## 2014-04-16 DIAGNOSIS — Z978 Presence of other specified devices: Secondary | ICD-10-CM | POA: Insufficient documentation

## 2014-04-16 DIAGNOSIS — C50912 Malignant neoplasm of unspecified site of left female breast: Secondary | ICD-10-CM

## 2014-04-16 DIAGNOSIS — C50919 Malignant neoplasm of unspecified site of unspecified female breast: Secondary | ICD-10-CM | POA: Diagnosis not present

## 2014-04-16 DIAGNOSIS — N63 Unspecified lump in unspecified breast: Secondary | ICD-10-CM

## 2014-04-16 DIAGNOSIS — Z9010 Acquired absence of unspecified breast and nipple: Secondary | ICD-10-CM | POA: Insufficient documentation

## 2014-04-16 NOTE — Telephone Encounter (Signed)
Spoke to pt, told her lab work was normal also. Pt verbalized understanding. Asked pt how she was feeling? Pt said better. Told her okay good.

## 2014-05-01 ENCOUNTER — Ambulatory Visit: Payer: 59 | Admitting: Internal Medicine

## 2014-05-02 ENCOUNTER — Ambulatory Visit: Payer: 59 | Admitting: Internal Medicine

## 2014-05-02 DIAGNOSIS — Z0289 Encounter for other administrative examinations: Secondary | ICD-10-CM

## 2014-05-05 ENCOUNTER — Other Ambulatory Visit: Payer: Self-pay | Admitting: Internal Medicine

## 2014-05-13 NOTE — Progress Notes (Signed)
-  No show-  Renee [name]  

## 2014-05-15 ENCOUNTER — Ambulatory Visit (HOSPITAL_COMMUNITY): Payer: 59 | Admitting: Oncology

## 2014-06-27 ENCOUNTER — Other Ambulatory Visit: Payer: Self-pay | Admitting: Internal Medicine

## 2014-07-01 ENCOUNTER — Ambulatory Visit (INDEPENDENT_AMBULATORY_CARE_PROVIDER_SITE_OTHER): Payer: 59 | Admitting: Internal Medicine

## 2014-07-01 ENCOUNTER — Encounter: Payer: Self-pay | Admitting: *Deleted

## 2014-07-01 ENCOUNTER — Encounter: Payer: Self-pay | Admitting: Internal Medicine

## 2014-07-01 VITALS — BP 116/80 | HR 81 | Temp 98.5°F | Resp 20 | Ht 60.0 in | Wt 132.0 lb

## 2014-07-01 DIAGNOSIS — F32A Depression, unspecified: Secondary | ICD-10-CM

## 2014-07-01 DIAGNOSIS — F329 Major depressive disorder, single episode, unspecified: Secondary | ICD-10-CM

## 2014-07-01 NOTE — Patient Instructions (Signed)
It is important that you exercise regularly, at least 20 minutes 3 to 4 times per week.  If you develop chest pain or shortness of breath seek  medical attention.  Return in 4 months for follow-up

## 2014-07-01 NOTE — Progress Notes (Signed)
Pre visit review using our clinic review tool, if applicable. No additional management support is needed unless otherwise documented below in the visit note. 

## 2014-07-01 NOTE — Progress Notes (Signed)
Subjective:    Patient ID: Renee [name], female    DOB: 1970-08-25, 43 y.o.   MRN: [id number]  HPI 43 year old patient who is seen today for follow-up.  Medical problems include the hypothyroidism.  She is seen today in follow-up for severe reactive depression.  She continues to do reasonably well and continues to have counseling at church with hospice and other support groups. She lost her stepson in August of last year   Wt Readings from Last 3 Encounters:  07/01/14 132 lb (59.875 kg)  04/10/14 119 lb (53.978 kg)  04/08/14 122 lb (55.339 kg)    Past Medical History  Diagnosis Date  . Breast cancer     2010 ; left side mastectomy chemo pill  . Thyroid disease   . Hypothyroidism   . DVT (deep venous thrombosis)     left arm    History   Social History  . Marital Status: Divorced    Spouse Name: N/A    Number of Children: N/A  . Years of Education: N/A   Occupational History  . Not on file.   Social History Main Topics  . Smoking status: Former Smoker -- 0.50 packs/day for 14 years    Quit date: 09/22/2010  . Smokeless tobacco: Never Used  . Alcohol Use: No  . Drug Use: No  . Sexual Activity: Not on file   Other Topics Concern  . Not on file   Social History Narrative    Past Surgical History  Procedure Laterality Date  . Mastectomy      left side  . Portacath placement    . Port-a-cath removal    . Breast reconstruction  01/2011    Family History  Problem Relation Age of Onset  . Hypertension Mother   . Diabetes Father   . Stroke Father   . Hypertension Father   . Hypertension Maternal Grandmother   . Diabetes Paternal Grandmother   . Cancer Paternal Grandfather     No Known Allergies  Current Outpatient Prescriptions on File Prior to Visit  Medication Sig Dispense Refill  . anastrozole (ARIMIDEX) 1 MG tablet Take 1 tablet (1 mg total) by mouth daily. 30 tablet 2  . buPROPion (WELLBUTRIN XL) 150 MG 24 hr tablet Take 1 tablet (150 mg total)  by mouth daily. 60 tablet 2  . escitalopram (LEXAPRO) 10 MG tablet TAKE ONE TABLET BY MOUTH ONCE DAILY 90 tablet 0  . levothyroxine (SYNTHROID, LEVOTHROID) 112 MCG tablet TAKE ONE TABLET BY MOUTH ONCE DAILY 90 tablet 0  . LORazepam (ATIVAN) 0.5 MG tablet Take 2 tablets (1 mg total) by mouth every 8 (eight) hours as needed for anxiety. 60 tablet 0  . Multiple Vitamins-Minerals (MULTIVITAMINS THER. W/MINERALS) TABS Take 1 tablet by mouth daily.      Marland Kitchen OVER THE COUNTER MEDICATION Take 1 capsule by mouth. Hair, Skin and Nail vitamin    . Phenylephrine-DM-GG-APAP (MUCINEX FAST-MAX COLD FLU) 5-10-200-325 MG TABS Take 1 tablet by mouth every 4 (four) hours.     No current facility-administered medications on file prior to visit.    BP 116/80 mmHg  Pulse 81  Temp(Src) 98.5 F (36.9 C) (Oral)  Resp 20  Ht 5' (1.524 m)  Wt 132 lb (59.875 kg)  BMI 25.78 kg/m2  SpO2 96%     Review of Systems  Constitutional: Negative.   HENT: Negative for congestion, dental problem, hearing loss, rhinorrhea, sinus pressure, sore throat and tinnitus.   Eyes: Negative for pain,  discharge and visual disturbance.  Respiratory: Negative for cough and shortness of breath.   Cardiovascular: Negative for chest pain, palpitations and leg swelling.  Gastrointestinal: Negative for nausea, vomiting, abdominal pain, diarrhea, constipation, blood in stool and abdominal distention.  Genitourinary: Negative for dysuria, urgency, frequency, hematuria, flank pain, vaginal bleeding, vaginal discharge, difficulty urinating, vaginal pain and pelvic pain.  Musculoskeletal: Negative for joint swelling, arthralgias and gait problem.  Skin: Negative for rash.  Neurological: Negative for dizziness, syncope, speech difficulty, weakness, numbness and headaches.  Hematological: Negative for adenopathy.  Psychiatric/Behavioral: Positive for dysphoric mood. Negative for behavioral problems and agitation. The patient is nervous/anxious.         Objective:   Physical Exam  Constitutional: She is oriented to person, place, and time. She appears well-developed and well-nourished.  HENT:  Head: Normocephalic.  Right Ear: External ear normal.  Left Ear: External ear normal.  Mouth/Throat: Oropharynx is clear and moist.  Eyes: Conjunctivae and EOM are normal. Pupils are equal, round, and reactive to light.  Neck: Normal range of motion. Neck supple. No thyromegaly present.  Cardiovascular: Normal rate, regular rhythm, normal heart sounds and intact distal pulses.   Pulmonary/Chest: Effort normal and breath sounds normal.  Abdominal: Soft. Bowel sounds are normal. She exhibits no mass. There is no tenderness.  Musculoskeletal: Normal range of motion.  Lymphadenopathy:    She has no cervical adenopathy.  Neurological: She is alert and oriented to person, place, and time.  Skin: Skin is warm and dry. No rash noted.  Psychiatric: She has a normal mood and affect. Her behavior is normal.          Assessment & Plan:  Reactive depression.  No change in medical regimen.  Will continue counseling.  Return here in 4 months or as needed

## 2014-07-17 ENCOUNTER — Other Ambulatory Visit: Payer: Self-pay | Admitting: Internal Medicine

## 2014-08-06 ENCOUNTER — Other Ambulatory Visit: Payer: Self-pay | Admitting: Internal Medicine

## 2014-08-15 ENCOUNTER — Encounter (HOSPITAL_COMMUNITY): Payer: 59 | Attending: Oncology | Admitting: Oncology

## 2014-08-15 ENCOUNTER — Encounter (HOSPITAL_COMMUNITY): Payer: Self-pay | Admitting: Oncology

## 2014-08-15 VITALS — BP 120/79 | HR 86 | Temp 98.5°F | Resp 18 | Wt 129.7 lb

## 2014-08-15 DIAGNOSIS — M8580 Other specified disorders of bone density and structure, unspecified site: Secondary | ICD-10-CM | POA: Diagnosis not present

## 2014-08-15 DIAGNOSIS — Z9012 Acquired absence of left breast and nipple: Secondary | ICD-10-CM | POA: Insufficient documentation

## 2014-08-15 DIAGNOSIS — R4182 Altered mental status, unspecified: Secondary | ICD-10-CM | POA: Diagnosis not present

## 2014-08-15 DIAGNOSIS — E039 Hypothyroidism, unspecified: Secondary | ICD-10-CM | POA: Diagnosis not present

## 2014-08-15 DIAGNOSIS — Z86718 Personal history of other venous thrombosis and embolism: Secondary | ICD-10-CM | POA: Insufficient documentation

## 2014-08-15 DIAGNOSIS — C773 Secondary and unspecified malignant neoplasm of axilla and upper limb lymph nodes: Secondary | ICD-10-CM

## 2014-08-15 DIAGNOSIS — F322 Major depressive disorder, single episode, severe without psychotic features: Secondary | ICD-10-CM

## 2014-08-15 DIAGNOSIS — Z923 Personal history of irradiation: Secondary | ICD-10-CM | POA: Insufficient documentation

## 2014-08-15 DIAGNOSIS — F4310 Post-traumatic stress disorder, unspecified: Secondary | ICD-10-CM

## 2014-08-15 DIAGNOSIS — C50912 Malignant neoplasm of unspecified site of left female breast: Secondary | ICD-10-CM

## 2014-08-15 DIAGNOSIS — Z08 Encounter for follow-up examination after completed treatment for malignant neoplasm: Secondary | ICD-10-CM | POA: Diagnosis not present

## 2014-08-15 DIAGNOSIS — Z853 Personal history of malignant neoplasm of breast: Secondary | ICD-10-CM | POA: Diagnosis not present

## 2014-08-15 DIAGNOSIS — Z9221 Personal history of antineoplastic chemotherapy: Secondary | ICD-10-CM | POA: Insufficient documentation

## 2014-08-15 DIAGNOSIS — F332 Major depressive disorder, recurrent severe without psychotic features: Secondary | ICD-10-CM | POA: Diagnosis not present

## 2014-08-15 DIAGNOSIS — F329 Major depressive disorder, single episode, unspecified: Secondary | ICD-10-CM

## 2014-08-15 DIAGNOSIS — Z79810 Long term (current) use of selective estrogen receptor modulators (SERMs): Secondary | ICD-10-CM | POA: Diagnosis not present

## 2014-08-15 DIAGNOSIS — F324 Major depressive disorder, single episode, in partial remission: Secondary | ICD-10-CM | POA: Insufficient documentation

## 2014-08-15 HISTORY — DX: Post-traumatic stress disorder, unspecified: F43.10

## 2014-08-15 HISTORY — DX: Major depressive disorder, single episode, severe without psychotic features: F32.2

## 2014-08-15 LAB — COMPREHENSIVE METABOLIC PANEL
ALK PHOS: 57 U/L (ref 39–117)
ALT: 12 U/L (ref 0–35)
ANION GAP: 7 (ref 5–15)
AST: 20 U/L (ref 0–37)
Albumin: 4.2 g/dL (ref 3.5–5.2)
BILIRUBIN TOTAL: 0.6 mg/dL (ref 0.3–1.2)
BUN: 14 mg/dL (ref 6–23)
CHLORIDE: 104 meq/L (ref 96–112)
CO2: 27 mmol/L (ref 19–32)
Calcium: 8.8 mg/dL (ref 8.4–10.5)
Creatinine, Ser: 0.68 mg/dL (ref 0.50–1.10)
GFR calc Af Amer: >90 mL/min (ref >90)
Glucose, Bld: 73 mg/dL (ref 70–99)
Potassium: 3.4 mmol/L — ABNORMAL LOW (ref 3.5–5.1)
SODIUM: 138 mmol/L (ref 135–145)
Total Protein: 7 g/dL (ref 6.0–8.3)

## 2014-08-15 LAB — CBC WITH DIFFERENTIAL/PLATELET
Basophils Absolute: 0 10*3/uL (ref 0.0–0.1)
Basophils Relative: 0 % (ref 0–1)
Eosinophils Absolute: 0.1 10*3/uL (ref 0.0–0.7)
Eosinophils Relative: 1 % (ref 0–5)
HEMATOCRIT: 41.4 % (ref 36.0–46.0)
HEMOGLOBIN: 13.6 g/dL (ref 12.0–15.0)
Lymphocytes Relative: 39 % (ref 12–46)
Lymphs Abs: 2.8 10*3/uL (ref 0.7–4.0)
MCH: 34.1 pg — ABNORMAL HIGH (ref 26.0–34.0)
MCHC: 32.9 g/dL (ref 30.0–36.0)
MCV: 103.8 fL — AB (ref 78.0–100.0)
Monocytes Absolute: 0.5 10*3/uL (ref 0.1–1.0)
Monocytes Relative: 6 % (ref 3–12)
NEUTROS ABS: 3.9 10*3/uL (ref 1.7–7.7)
NEUTROS PCT: 54 % (ref 43–77)
PLATELETS: 164 10*3/uL (ref 150–400)
RBC: 3.99 MIL/uL (ref 3.87–5.11)
RDW: 13.1 % (ref 11.5–15.5)
WBC: 7.3 10*3/uL (ref 4.0–10.5)

## 2014-08-15 MED ORDER — ANASTROZOLE 1 MG PO TABS: 1.0000 mg | ORAL_TABLET | Freq: Every day | ORAL | Status: DC

## 2014-08-15 NOTE — Assessment & Plan Note (Addendum)
Followed by primary care provider.  Will benefit from psych or behavorial health evaluation.  Not suicidal or homicidal.  Will get CSW involved.

## 2014-08-15 NOTE — Progress Notes (Signed)
Renee [name] presented for labwork. Labs per MD order drawn via Peripheral Line 23 gauge needle inserted in Right AC  Good blood return present. Procedure without incident.  Needle removed intact. Patient tolerated procedure well.  STAR Program Physical Impairment and Functional Assessment Screening Tool  1. Are you having any pain, including headaches, joint pain, or muscle pain (upper body = OT; lower body = PT)?  No  2. Do your hands and/or feet feel numb or tingle (PT)?  No  3. Does any part of your body feel swollen or larger than usual (upper body = OT; lower body = PT)?  No  4. Are you so tired that you cannot do the things you want or need to do (PT or OT)?  Yes, this is new since my last visit.  5. Are you feeling weak or are you having trouble moving any part of your body (PT/OT)?  No  6. Are you having trouble concentrating, thinking, or remembering things (OT/ST)?  Yes, this is new since my last visit.  7. Are you having trouble moving around or feel like you might trip or fall (PT)?  No  8. Are you having trouble swallowing (ST)?  No  9. Are you having trouble speaking (ST)?  No  10. Are you having trouble with going or getting to the bathroom (OT)?  No  11. Are you having trouble with your sexual function (OT)?  No  12. Are you having trouble lifting things, even [name] your arms (OT/PT)?  Yes, this is new since my last visit.  59. Are you having trouble taking care of yourself as in dressing or bathing (OT)?  No  14. Are you having trouble with daily tasks like chores or shopping (OT)?  No  15. Are you having trouble driving (OT)?  No  16. Are you having trouble returning to work or completing your tasks at work (OT)?  Yes, this started after my diagnosis and is still a problem.  Other concerns:    Legend: OT = Occupational Therapy PT = Physical Therapy ST = Speech Therapy

## 2014-08-15 NOTE — Assessment & Plan Note (Addendum)
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 Both metastases greater than 3 mm but she also had extracapsular extension. No LV I was seen. ER +96%, PR +11%, Ki-67 20%, HER-2/neu nonamplified. Date of surgery was on 10/08/2009. She had dose dense epirubicin/Cytoxan for 4 cycles followed by weekly Taxol for 12 weeks followed by radiation therapy by Dr. Kyung Rudd finishing on 06/19/2010. She then started tamoxifen on 06/23/2010 and it was proposed that she will continue this for 10 years, but her hormone status demonstrates a post-menopausal state and therefore, she was switched to Arimidex on 04/03/2014.  Now with mental status changes requiring MRI brain w and wo contrast.  Will perform labs today.  She needs bone density exam for AI therapy.  Arimidex escribed to pharmacy with 5 refills.

## 2014-08-15 NOTE — Patient Instructions (Signed)
[facility] Discharge Instructions  RECOMMENDATIONS MADE BY THE CONSULTANT AND ANY TEST RESULTS WILL BE SENT TO YOUR REFERRING PHYSICIAN.  We will set you up for an MRI of brain Return back to Arimidex.  This is sent to [facility]. Continue follow-up with primary care as directed Bone density exam is needed Labs today: CBC diff, CMET. Our social worker will be contacting you in the future. Call us when needed. Return in 3 weeks for follow-up  Thank you for choosing [facility] to provide your oncology and hematology care.  To afford each patient quality time with our providers, please arrive at least 15 minutes before your scheduled appointment time.  With your help, our goal is to use those 15 minutes to complete the necessary work-up to ensure our physicians have the information they need to help with your evaluation and healthcare recommendations.    Effective January 1st, 2014, we ask that you re-schedule your appointment with our physicians should you arrive 10 or more minutes late for your appointment.  We strive to give you quality time with our providers, and arriving late affects you and other patients whose appointments are after yours.    Again, thank you for choosing [facility].  Our hope is that these requests will decrease the amount of time that you wait before being seen by our physicians.       _____________________________________________________________  Should you have questions after your visit to [facility], please contact our office at [phone] between the hours of 8:30 a.m. and 5:00 p.m.  Voicemails left after 4:30 p.m. will not be returned until the following business day.  For prescription refill requests, have your pharmacy contact our office with your prescription refill request.

## 2014-08-15 NOTE — Assessment & Plan Note (Signed)
Secondary to discovering her deceased stepson from an overdose on Opana.  "I need a way to get that image out of my mind."

## 2014-08-15 NOTE — Progress Notes (Signed)
      Renee FRANK, MD [address] [place]  [address]  Infiltrating ductal carcinoma of breast, left - Plan: anastrozole (ARIMIDEX) 1 MG tablet, CBC with Differential, Comprehensive metabolic panel, MR Brain W Wo Contrast, CBC with Differential, Comprehensive metabolic panel  Altered mental status, unspecified altered mental status type  Major depressive disorder, recurrent, severe without psychotic features  PTSD (post-traumatic stress disorder)  CURRENT THERAPY: Arimidex daily beginning on 04/03/2014 after switching from Tamoxifen.   INTERVAL HISTORY: Renee [name] 43 y.o. 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 Both metastases greater than 3 mm but she also had extracapsular extension. No LV I was seen. ER +96%, PR +11%, Ki-67 20%, HER-2/neu nonamplified. Date of surgery was on 10/08/2009. She had dose dense epirubicin/Cytoxan for 4 cycles followed by weekly Taxol for 12 weeks followed by radiation therapy by Dr. John Moody finishing on 06/19/2010. She then started tamoxifen on 06/23/2010 and it was proposed that she will continue this for 10 years, but her hormone status demonstrates a post-menopausal state and therefore, she was switched to Arimidex on 04/03/2014.    Renee [name] is seen as a work-in today.  She reports that she has been experiencing odd auras.  She describes it the following way: "If you are using your cell phone and recording a video, and then pause it, that is how I feel."  Another way she describes it: "if you take jumper cables that are hot and touch them together, that is how I feel."  She denies any seizures.  She denies any LOC.  She denies any crossing of her eyes or visual disturbances.  She is afraid her cancer is back and has spread to her brain.  She notes that she was doing well with Arimidex, but at one point,  her pharmacy refused to fill the medication.  Therefore, she took one of her old Tamoxifen bottles and got that filled.  I received notification from her insurance company today that she is on wellbutrin which can decrease the effectiveness of Tamoxifen.  On further research, she was only prescribed a 3 month supply of Arimidex  In August 2015 and therefore, her refills likely ran out.  I did not receive any notification from the pharmacy or the patient regarding the need for refills.  She did not show for her last appointment.  Severe depression and PTSD-like issues remain.  She needs further intervention for this because it is consuming her life.  She lost her 2nd job because she was "forgetful."  She reports that she really liked the job and was hoping to stay their long term.  She cannot get the image of her dead step son out of her head.  Her support at home is diminished because her husband is abnormally grieving the loss of his only son (since this is greater than 6 months).  She accompanies him to support groups due to this loss.  "Why am I alive and he is dead."  She denies any homicidal or suicidal ideations recently.  "I've thought about harming myself a few weeks ago."  She denies having a plan in place.  She needs psych consultation or behavioral health.  I will get social worker involved.   Past Medical History  Diagnosis Date  . Breast cancer     2010 ; left side mastectomy chemo pill  .   Thyroid disease   . Hypothyroidism   . DVT (deep venous thrombosis)     left arm  . Depression   . Severe major depression 08/15/2014  . PTSD (post-traumatic stress disorder) 08/15/2014    has GRAVES DISEASE; HYPOTHYROIDISM; HYPERLIPIDEMIA; ANEMIA-IRON DEFICIENCY; Depression; TREMOR; NEPHROLITHIASIS, HX OF; 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; Thyroid disease; DVT of upper extremity (deep vein thrombosis); Severe major depression; and PTSD (post-traumatic stress disorder) on her problem list.      has No Known Allergies.  Renee [name] had no medications administered during this visit.  Past Surgical History  Procedure Laterality Date  . Mastectomy      left side  . Portacath placement    . Port-a-cath removal    . Breast reconstruction  01/2011    Denies any headaches, dizziness, double vision, fevers, chills, night sweats, nausea, vomiting, diarrhea, constipation, chest pain, heart palpitations, shortness of breath, blood in stool, black tarry stool, urinary pain, urinary burning, urinary frequency, hematuria.   PHYSICAL EXAMINATION  ECOG PERFORMANCE STATUS: 1 - Symptomatic but completely ambulatory  Filed Vitals:   08/15/14 1224  BP: 120/79  Pulse: 86  Temp: 98.5 F (36.9 C)  Resp: 18    GENERAL:alert, healthy, well nourished, well developed, comfortable, cooperative, crying and smiling SKIN: skin color, texture, turgor are normal, no rashes or significant lesions HEAD: Normocephalic, No masses, lesions, tenderness or abnormalities EYES: normal, PERRLA, EOMI, Conjunctiva are pink and non-injected EARS: External ears normal OROPHARYNX:lips, buccal mucosa, and tongue normal and mucous membranes are moist  NECK: supple, no adenopathy, thyroid normal size, non-tender, without nodularity, no stridor, non-tender, trachea midline LYMPH:  no palpable lymphadenopathy, no hepatosplenomegaly BREAST:right breast normal without mass, skin or nipple changes or axillary nodes, left post-reconstruction site well healed and free of suspicious changes LUNGS: clear to auscultation and percussion HEART: regular rate & rhythm, no murmurs, no gallops, S1 normal and S2 normal ABDOMEN:abdomen soft, non-tender and normal bowel sounds BACK: Back symmetric, no curvature. EXTREMITIES:less then 2 second capillary refill, no joint deformities, effusion, or inflammation, no skin discoloration, no clubbing, no cyanosis  NEURO: alert & oriented x 3 with fluent speech, no focal motor/sensory  deficits, gait normal    LABORATORY DATA: CBC    Component Value Date/Time   WBC 7.2 04/10/2014 1218   RBC 3.86* 04/10/2014 1218   HGB 13.1 04/10/2014 1218   HCT 39.5 04/10/2014 1218   PLT 152.0 04/10/2014 1218   MCV 102.4* 04/10/2014 1218   MCH 34.3* 04/03/2014 1005   MCHC 33.2 04/10/2014 1218   RDW 14.1 04/10/2014 1218   LYMPHSABS 1.9 04/10/2014 1218   MONOABS 0.6 04/10/2014 1218   EOSABS 0.1 04/10/2014 1218   BASOSABS 0.0 04/10/2014 1218      Chemistry      Component Value Date/Time   NA 137 04/10/2014 1218   K 3.9 04/10/2014 1218   CL 102 04/10/2014 1218   CO2 28 04/10/2014 1218   BUN 8 04/10/2014 1218   CREATININE 0.8 04/10/2014 1218      Component Value Date/Time   CALCIUM 9.4 04/10/2014 1218   ALKPHOS 55 04/10/2014 1218   AST 21 04/10/2014 1218   ALT 11 04/10/2014 1218   BILITOT 0.5 04/10/2014 1218        RADIOGRAPHIC STUDIES:  04/16/2014   CLINICAL DATA: 43-year-old female for annual bilateral mammograms and with palpable thickening in the left breast discovered on self-examination. History of left breast cancer, mastectomy and implants.  EXAM: DIGITAL DIAGNOSTIC   BILATERAL MAMMOGRAM WITH IMPLANTS AND CAD  ULTRASOUND LEFT BREAST  COMPARISON: 11/14/2012 and prior mammograms dating back to 09/24/2009  ACR Breast Density Category b: There are scattered areas of fibroglandular density.  FINDINGS: Routine and implant displaced views are performed.  Retropectoral implants are present.  No suspicious mass, nonsurgical distortion or worrisome calcifications are identified bilaterally.  Mammographic images were processed with CAD.  On physical exam, minimal thickening in the inner left breast is identified underlying the patient's scar.  Ultrasound is performed, showing no evidence of solid or cystic mass, suspicious distortion or abnormal areas of shadowing within the inner left breast, in the area of patient concern  P  IMPRESSION: No mammographic, suspicious palpable or sonographic abnormality within the inner left breast, in the area of patient concern.  No mammographic evidence of breast malignancy bilaterally.  RECOMMENDATION: Right screening mammogram in 1 year.  Clinical followup of the left breast as indicated.  I have discussed the findings and recommendations with the patient. Results were also provided in writing at the conclusion of the visit. If applicable, a reminder letter will be sent to the patient regarding the next appointment.  BI-RADS CATEGORY 2: Benign.   Electronically Signed  By: Jeff Hu M.D.  On: 04/16/2014 16:07      ASSESSMENT AND PLAN:  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 Stage IIIa (T3, N1) grade 3, poorly differentiated infiltrating ductal carcinoma the breast on the left with 2 of 3 positive lymph nodes. Both metastases greater than 3 mm but she also had extracapsular extension. No LV I was seen. ER +96%, PR +11%, Ki-67 20%, HER-2/neu nonamplified. Date of surgery was on 10/08/2009. She had dose dense epirubicin/Cytoxan for 4 cycles followed by weekly Taxol for 12 weeks followed by radiation therapy by Dr. John Moody finishing on 06/19/2010. She then started tamoxifen on 06/23/2010 and it was proposed that she will continue this for 10 years, but her hormone status demonstrates a post-menopausal state and therefore, she was switched to Arimidex on 04/03/2014.  Now with mental status changes requiring MRI brain w and wo contrast.  Will perform labs today.  She needs bone density exam for AI therapy.  Arimidex escribed to pharmacy with 5 refills.    Severe major depression Followed by primary care provider.  Will benefit from psych or behavorial health evaluation.  Not suicidal or homicidal.  Will get CSW involved.  PTSD (post-traumatic stress disorder) Secondary to discovering her deceased stepson from an overdose on Opana.  "I need a way to  get that image out of my mind."     THERAPY PLAN:  Will perform labs today, MRI brain w and wo contrast, and bone density.  Return in 3 weeks for follow-up.  All questions were answered. The patient knows to call the clinic with any problems, questions or concerns. We can certainly see the patient much sooner if necessary.  Patient and plan discussed with Dr. Shannon Penland and she is in agreement with the aforementioned.   KEFALAS,THOMAS 08/15/2014     

## 2014-08-20 ENCOUNTER — Ambulatory Visit (INDEPENDENT_AMBULATORY_CARE_PROVIDER_SITE_OTHER): Payer: 59 | Admitting: Internal Medicine

## 2014-08-20 ENCOUNTER — Encounter: Payer: Self-pay | Admitting: Internal Medicine

## 2014-08-20 VITALS — BP 110/70 | HR 70 | Temp 98.1°F | Resp 18 | Ht 60.0 in | Wt 132.0 lb

## 2014-08-20 DIAGNOSIS — E039 Hypothyroidism, unspecified: Secondary | ICD-10-CM

## 2014-08-20 DIAGNOSIS — F329 Major depressive disorder, single episode, unspecified: Secondary | ICD-10-CM

## 2014-08-20 DIAGNOSIS — F32A Depression, unspecified: Secondary | ICD-10-CM

## 2014-08-20 NOTE — Progress Notes (Signed)
Pre visit review using our clinic review tool, if applicable. No additional management support is needed unless otherwise documented below in the visit note. 

## 2014-08-20 NOTE — Patient Instructions (Signed)
Behavioral health consultation as scheduled  Return in 3 months for follow-up

## 2014-08-20 NOTE — Progress Notes (Signed)
Subjective:    Patient ID: Renee [name], female    DOB: 1970/09/24, 44 y.o.   MRN: [id number]  HPI  44 year old patient who is seen today in follow-up for major depression.  She has not improved over the past 2 months.  She recently lost her job due to poor job performance and inability to focus.  She has been seen by oncology.  Recently and is scheduled for behavioral health follow-up in [place] next week.  She continues to have counseling, both at work and with hospice and remains on medications.   No suicide ideation Past Medical History  Diagnosis Date  . Breast cancer     2010 ; left side mastectomy chemo pill  . Thyroid disease   . Hypothyroidism   . DVT (deep venous thrombosis)     left arm  . Depression   . Severe major depression 08/15/2014  . PTSD (post-traumatic stress disorder) 08/15/2014    History   Social History  . Marital Status: Divorced    Spouse Name: N/A    Number of Children: N/A  . Years of Education: N/A   Occupational History  . Not on file.   Social History Main Topics  . Smoking status: Former Smoker -- 0.50 packs/day for 14 years    Quit date: 09/22/2010  . Smokeless tobacco: Never Used  . Alcohol Use: No  . Drug Use: No  . Sexual Activity: Not on file   Other Topics Concern  . Not on file   Social History Narrative    Past Surgical History  Procedure Laterality Date  . Mastectomy      left side  . Portacath placement    . Port-a-cath removal    . Breast reconstruction  01/2011    Family History  Problem Relation Age of Onset  . Hypertension Mother   . Diabetes Father   . Stroke Father   . Hypertension Father   . Hypertension Maternal Grandmother   . Diabetes Paternal Grandmother   . Cancer Paternal Grandfather     No Known Allergies  Current Outpatient Prescriptions on File Prior to Visit  Medication Sig Dispense Refill  . anastrozole (ARIMIDEX) 1 MG tablet Take 1 tablet (1 mg total) by mouth daily. 30 tablet 5  .  buPROPion (WELLBUTRIN XL) 150 MG 24 hr tablet TAKE ONE TABLET BY MOUTH DAILY 60 tablet 2  . escitalopram (LEXAPRO) 10 MG tablet TAKE ONE TABLET BY MOUTH ONCE DAILY 90 tablet 0  . levothyroxine (SYNTHROID, LEVOTHROID) 112 MCG tablet TAKE ONE TABLET BY MOUTH ONCE DAILY 90 tablet 1  . LORazepam (ATIVAN) 0.5 MG tablet Take 2 tablets (1 mg total) by mouth every 8 (eight) hours as needed for anxiety. 60 tablet 0  . Multiple Vitamins-Minerals (MULTIVITAMINS THER. W/MINERALS) TABS Take 1 tablet by mouth daily.      Marland Kitchen OVER THE COUNTER MEDICATION Take 1 capsule by mouth. Hair, Skin and Nail vitamin    . Phenylephrine-DM-GG-APAP (MUCINEX FAST-MAX COLD FLU) 5-10-200-325 MG TABS Take 1 tablet by mouth every 4 (four) hours.     No current facility-administered medications on file prior to visit.    BP 110/70 mmHg  Pulse 70  Temp(Src) 98.1 F (36.7 C) (Oral)  Resp 18  Ht 5' (1.524 m)  Wt 132 lb (59.875 kg)  BMI 25.78 kg/m2  SpO2 97%      Review of Systems  Constitutional: Negative.   HENT: Negative for congestion, dental problem, hearing loss, rhinorrhea, sinus pressure,  sore throat and tinnitus.   Eyes: Negative for pain, discharge and visual disturbance.  Respiratory: Negative for cough and shortness of breath.   Cardiovascular: Negative for chest pain, palpitations and leg swelling.  Gastrointestinal: Negative for nausea, vomiting, abdominal pain, diarrhea, constipation, blood in stool and abdominal distention.  Genitourinary: Negative for dysuria, urgency, frequency, hematuria, flank pain, vaginal bleeding, vaginal discharge, difficulty urinating, vaginal pain and pelvic pain.  Musculoskeletal: Negative for joint swelling, arthralgias and gait problem.  Skin: Negative for rash.  Neurological: Negative for dizziness, syncope, speech difficulty, weakness, numbness and headaches.  Hematological: Negative for adenopathy.  Psychiatric/Behavioral: Positive for sleep disturbance, dysphoric mood  and decreased concentration. Negative for suicidal ideas, behavioral problems and agitation. The patient is nervous/anxious.        Objective:   Physical Exam  Constitutional: She is oriented to person, place, and time. She appears well-developed and well-nourished. No distress.  HENT:  Head: Normocephalic.  Right Ear: External ear normal.  Left Ear: External ear normal.  Mouth/Throat: Oropharynx is clear and moist.  Eyes: Conjunctivae and EOM are normal. Pupils are equal, round, and reactive to light.  Neck: Normal range of motion. Neck supple. No thyromegaly present.  Cardiovascular: Normal rate, regular rhythm, normal heart sounds and intact distal pulses.   Pulmonary/Chest: Effort normal and breath sounds normal.  Abdominal: Soft. Bowel sounds are normal. She exhibits no mass. There is no tenderness.  Musculoskeletal: Normal range of motion.  Lymphadenopathy:    She has no cervical adenopathy.  Neurological: She is alert and oriented to person, place, and time.  Skin: Skin is warm and dry. No rash noted.  Psychiatric: She has a normal mood and affect. Her behavior is normal.  Depressed mood At times tearful          Assessment & Plan:  Major depression.  Unimproved.  Will continue present medical regimen.  Patient is scheduled for behavioral health follow-up next week Breast cancer.  Metastatic workup scheduled for next week

## 2014-08-23 ENCOUNTER — Other Ambulatory Visit (HOSPITAL_COMMUNITY): Payer: Self-pay | Admitting: Oncology

## 2014-08-23 ENCOUNTER — Encounter: Payer: Self-pay | Admitting: *Deleted

## 2014-08-23 ENCOUNTER — Encounter (HOSPITAL_COMMUNITY): Payer: Self-pay | Admitting: Lab

## 2014-08-23 NOTE — Progress Notes (Signed)
Referral sent to [facility] / Records faxed on 1/15.  Office to call patient with appt.

## 2014-08-23 NOTE — Progress Notes (Signed)
[place] [facility] Clinical Social Work  Clinical Social Work was referred by Futures trader for assessment of psychosocial needs due to grief concerns and severe depression.  Clinical Social Worker made several attempts to contact patient at home to offer support and assess for needs. Pt returned call on 08/22/14. CSW and pt spoke at length regarding her issues with depression, traumatic grief event and family stressors.  Currently, she has not been receiving counseling for these concerns, but has been attending a grief support group.  She is open to counseling as well and plans to contact her insurance to see what providers are in-network.  The group has been helping and she plans to keep going. Many of her issues are related to many losses and grief surrounding these losses. She has lost jobs, many close people in her life and experienced finding her stepson deceased.    She feels her medication for her depression is helping, but she is open to follow up with a psychiatrist for further medication management. She denied any SI currently and has no plan as well. She was goal oriented in our discussion and finds great joy in her children and grand children. She has good support from friends, extended family and her church. She is also very anxious about possible reoccurrence of her cancer and CSW validated these emotions and discussed ways to cope as well. Many of her emotions are common for her current situations.   She is open to additional follow up and counseling and is aware of how to locate counselor through her insurance provider. Pt aware of resources of Pt and Family Support Team and how to reach CSW as needed for further follow up. CSW to be available at future appointments and can follow as well.   Clinical Social Work interventions: Emotional support Solution focused discussion Resource education   Loren Racer, [place] Tuesdays 8:30-1pm Wednesdays 8:30-12pm  [phone]  [phone]

## 2014-08-26 ENCOUNTER — Ambulatory Visit (HOSPITAL_COMMUNITY)
Admission: RE | Admit: 2014-08-26 | Discharge: 2014-08-26 | Disposition: A | Discharge status: Home / Self Care | Payer: 59 | Source: Ambulatory Visit | Attending: Oncology | Admitting: Oncology

## 2014-08-26 DIAGNOSIS — M810 Age-related osteoporosis without current pathological fracture: Secondary | ICD-10-CM | POA: Diagnosis not present

## 2014-08-26 DIAGNOSIS — F4310 Post-traumatic stress disorder, unspecified: Secondary | ICD-10-CM

## 2014-08-26 DIAGNOSIS — Z13820 Encounter for screening for osteoporosis: Secondary | ICD-10-CM | POA: Insufficient documentation

## 2014-08-26 DIAGNOSIS — R51 Headache: Secondary | ICD-10-CM | POA: Diagnosis not present

## 2014-08-26 DIAGNOSIS — M8580 Other specified disorders of bone density and structure, unspecified site: Secondary | ICD-10-CM | POA: Diagnosis not present

## 2014-08-26 DIAGNOSIS — R4182 Altered mental status, unspecified: Secondary | ICD-10-CM

## 2014-08-26 DIAGNOSIS — F329 Major depressive disorder, single episode, unspecified: Secondary | ICD-10-CM | POA: Insufficient documentation

## 2014-08-26 DIAGNOSIS — Z853 Personal history of malignant neoplasm of breast: Secondary | ICD-10-CM | POA: Diagnosis not present

## 2014-08-26 DIAGNOSIS — Z780 Asymptomatic menopausal state: Secondary | ICD-10-CM | POA: Insufficient documentation

## 2014-08-26 DIAGNOSIS — C50919 Malignant neoplasm of unspecified site of unspecified female breast: Secondary | ICD-10-CM

## 2014-08-26 DIAGNOSIS — F332 Major depressive disorder, recurrent severe without psychotic features: Secondary | ICD-10-CM

## 2014-08-26 DIAGNOSIS — C50912 Malignant neoplasm of unspecified site of left female breast: Secondary | ICD-10-CM

## 2014-08-26 MED ORDER — GADOBENATE DIMEGLUMINE 529 MG/ML IV SOLN
12.0000 mL | Freq: Once | INTRAVENOUS | Status: AC | PRN
  Administered 2014-08-26: 12 mL via INTRAVENOUS

## 2014-08-26 NOTE — Progress Notes (Signed)
Patient notified

## 2014-08-28 ENCOUNTER — Encounter: Payer: Self-pay | Admitting: *Deleted

## 2014-08-28 ENCOUNTER — Ambulatory Visit (HOSPITAL_COMMUNITY): Payer: 59 | Attending: Oncology | Admitting: Physical Therapy

## 2014-08-28 NOTE — Progress Notes (Signed)
[facility] Clinical Social Work  Clinical Social Work phoned pt to check in and reassess needs. CSW plans to meet with pt prior to her appointment on 09/04/14. CSW left message stating such. Pt aware to phone CSW as needed, both contact numbers left.       Loren Racer, [place] Tuesdays 8:30-1pm Wednesdays 8:30-12pm  [phone]

## 2014-09-01 ENCOUNTER — Encounter (HOSPITAL_COMMUNITY): Payer: Self-pay | Admitting: Oncology

## 2014-09-01 DIAGNOSIS — M8580 Other specified disorders of bone density and structure, unspecified site: Secondary | ICD-10-CM

## 2014-09-01 HISTORY — DX: Other specified disorders of bone density and structure, unspecified site: M85.80

## 2014-09-01 NOTE — Assessment & Plan Note (Signed)
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 Both metastases greater than 3 mm but she also had extracapsular extension. No LV I was seen. ER +96%, PR +11%, Ki-67 20%, HER-2/neu nonamplified. Date of surgery was on 10/08/2009. She had dose dense epirubicin/Cytoxan for 4 cycles followed by weekly Taxol for 12 weeks followed by radiation therapy by Dr. Kyung Rudd finishing on 06/19/2010. She then started tamoxifen on 06/23/2010 and it was proposed that she will continue this for 10 years, but her hormone status demonstrates a post-menopausal state and therefore, she was switched to Arimidex on 04/03/2014.Negative MRI of brain for metastatic disease.  Continue surveillance per NCCN guidelines.  Return in 6 months for follow-up.

## 2014-09-01 NOTE — Assessment & Plan Note (Addendum)
Bone density exam demonstrates osteopenia.  She is giiven a prescription for OSCAL-D BID.  I have recommended the addition of Prolia, but due to cost at this time and her unemployement, she will consider this additional treatment choice.  She will continue with AI daily.

## 2014-09-01 NOTE — Progress Notes (Signed)
[name], MD [place] [address]  Osteopenia - Plan: calcium-vitamin D (OSCAL WITH D) 500-200 MG-UNIT per tablet  Infiltrating ductal carcinoma of breast, left  CURRENT THERAPY: Arimidex daily beginning on 04/03/2014 after switching from Tamoxifen.   INTERVAL HISTORY: NOREENE [name] 44 y.o. 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 Both metastases greater than 3 mm but she also had extracapsular extension. No LV I was seen. ER +96%, PR +11%, Ki-67 20%, HER-2/neu nonamplified. Date of surgery was on 10/08/2009. She had dose dense epirubicin/Cytoxan for 4 cycles followed by weekly Taxol for 12 weeks followed by radiation therapy by Dr. Kyung Rudd finishing on 06/19/2010. She then started tamoxifen on 06/23/2010 and it was proposed that she will continue this for 10 years, but her hormone status demonstrates a post-menopausal state and therefore, she was switched to Arimidex on 04/03/2014.  Please see dictation from 08/15/2014 about details.   I personally reviewed and went over radiographic studies with the patient.  The results are noted within this dictation.  MRI of brian is negative for any metastatic disease.  Bone density exam demonstrates osteopenia.  With her osteopenia, I have discussed treatment in detail including calcium and vit d daily.  I have additionally recommended Prolia as treatment as well.  She is not interested at this time due to financial reasons.  I have reviewed the risks, benefits, alternatives, and side effects of Prolia including site reaction, hypocalcemia, and ONJ.  For now, she will only take Ca and Vit D.  From a depression standpoint, she is going to call Abby Potash back, our clinical Education officer, museum.  Additionally, she will reach out to the [facility] program for rehabilitation.    She has an interview  this afternoon for a new job.    Oncologically, she denies any complaints and ROS questioning is negative.  Past Medical History  Diagnosis Date  . Breast cancer     2010 ; left side mastectomy chemo pill  . Thyroid disease   . Hypothyroidism   . DVT (deep venous thrombosis)     left arm  . Depression   . Severe major depression 08/15/2014  . PTSD (post-traumatic stress disorder) 08/15/2014  . Osteopenia 09/01/2014    has GRAVES DISEASE; Hypothyroidism; HYPERLIPIDEMIA; ANEMIA-IRON DEFICIENCY; Depression; TREMOR; NEPHROLITHIASIS, HX OF; 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; Thyroid disease; DVT of upper extremity (deep vein thrombosis); Severe major depression; PTSD (post-traumatic stress disorder); and Osteopenia on her problem list.     has No Known Allergies.  Ms. Piechocki does not currently have medications on file.  Past Surgical History  Procedure Laterality Date  . Mastectomy      left side  . Portacath placement    . Port-a-cath removal    . Breast reconstruction  01/2011    Denies any headaches, dizziness, double vision, fevers, chills, night sweats, nausea, vomiting, diarrhea, constipation, chest pain, heart palpitations, shortness of breath, blood in stool, black tarry stool, urinary pain, urinary burning, urinary frequency, hematuria.   PHYSICAL EXAMINATION  ECOG PERFORMANCE STATUS: 0 - Asymptomatic  Filed Vitals:   09/04/14 1200  BP: 112/71  Pulse: 75  Temp: 98.9 F (37.2 C)  Resp: 16    GENERAL:alert, no distress, well nourished, well developed, comfortable, cooperative and smiling SKIN: skin color, texture, turgor are normal, no rashes  or significant lesions HEAD: Normocephalic, No masses, lesions, tenderness or abnormalities EYES: normal, PERRLA, EOMI, Conjunctiva are pink and non-injected EARS: External ears normal OROPHARYNX:mucous membranes are moist  NECK: supple, trachea midline LYMPH:  not examined BREAST:not examined LUNGS: not  examined HEART: not examined ABDOMEN:not examined BACK: Back symmetric, no curvature. EXTREMITIES:less then 2 second capillary refill, no skin discoloration, no cyanosis  NEURO: alert & oriented x 3 with fluent speech, no focal motor/sensory deficits, gait normal   LABORATORY DATA: CBC    Component Value Date/Time   WBC 7.3 08/15/2014 1339   RBC 3.99 08/15/2014 1339   HGB 13.6 08/15/2014 1339   HCT 41.4 08/15/2014 1339   PLT 164 08/15/2014 1339   MCV 103.8* 08/15/2014 1339   MCH 34.1* 08/15/2014 1339   MCHC 32.9 08/15/2014 1339   RDW 13.1 08/15/2014 1339   LYMPHSABS 2.8 08/15/2014 1339   MONOABS 0.5 08/15/2014 1339   EOSABS 0.1 08/15/2014 1339   BASOSABS 0.0 08/15/2014 1339      Chemistry      Component Value Date/Time   NA 138 08/15/2014 1339   K 3.4* 08/15/2014 1339   CL 104 08/15/2014 1339   CO2 27 08/15/2014 1339   BUN 14 08/15/2014 1339   CREATININE 0.68 08/15/2014 1339      Component Value Date/Time   CALCIUM 8.8 08/15/2014 1339   ALKPHOS 57 08/15/2014 1339   AST 20 08/15/2014 1339   ALT 12 08/15/2014 1339   BILITOT 0.6 08/15/2014 1339       RADIOGRAPHIC STUDIES:  Mr Jeri Cos AC Contrast  08/26/2014   CLINICAL DATA:  Frontal headache and depression for 2 months. History of breast cancer.  EXAM: MRI HEAD WITHOUT AND WITH CONTRAST  TECHNIQUE: Multiplanar, multiecho pulse sequences of the brain and surrounding structures were obtained without and with intravenous contrast.  CONTRAST:  70m MULTIHANCE GADOBENATE DIMEGLUMINE 529 MG/ML IV SOLN  COMPARISON:  None.  FINDINGS: The patient was unable to remain motionless for the exam. Small or subtle lesions could be overlooked.  No evidence for acute infarction, hemorrhage, mass lesion, hydrocephalus, or extra-axial fluid. Normal cerebral volume. No white matter disease.  Pituitary, pineal, and cerebellar tonsils unremarkable. No upper cervical lesions. Flow voids are maintained throughout the carotid, basilar, and LEFT  vertebral arteries; RIGHT vertebral ends in PICA. There are no areas of chronic hemorrhage.  Post infusion, no abnormal enhancement of the brain or meninges. Major dural venous sinuses are patent with the LEFT dominant.  Visualized calvarium, skull base, and upper cervical osseous structures unremarkable. Scalp and extracranial soft tissues, orbits, sinuses, and mastoids show no acute process.  IMPRESSION: Negative exam.  No acute intracranial findings.   Electronically Signed   By: JRolla FlattenM.D.   On: 08/26/2014 15:03   Dg Bone Density  08/26/2014   EXAM: DUAL X-RAY ABSORPTIOMETRY (DXA) FOR BONE MINERAL DENSITY  IMPRESSION: Ordering Physician: TBaird CancerPA-C,  Your patient LJENNISE BOTHcompleted a BMD test on 08/26/2014 using the [place](software version: 14.10) manufactured by [employer] The following summarizes the results of our evaluation. PATIENT BIOGRAPHICAL: Name: GKALIS, FRIESEPatient ID: [id number] Date: [birth year]Height: 60.0 in. Gender: Female Exam Date: 08/26/2014 Weight: 132.0 lbs. Indications: Caucasian, Hx Breast Ca, Low Calcium Intake, Post Menopausal, Secondary Osteoporosis Fractures: Treatments: Arimidex DENSITOMETRY RESULTS: Site      Region    Measured Date Measured Age WHO Classification Young Adult T-score BMD         %  Change vs. Previous Significant Change (*) AP Spine L1-L4 08/26/2014 44.9 Normal -0.5 1.121 g/cm2  DualFemur Neck Left 08/26/2014 44.9 Osteopenia -1.4 0.843 g/cm2 ASSESSMENT: BMD as determined from Femur Neck Left is 0.843 g/cm2 with a T-Score of -1.4. This patient is considered osteopenic according to [facility]) criteria.  World Pharmacologist (WHO) criteria for post-menopausal, Caucasian Women: Normal:       T-score at or above -1 SD Osteopenia:   T-score between -1 and -2.5 SD Osteoporosis: T-score at or below -2.5 SD  Your patient Renee [name] Neuner completed a FRAX assessment on 08/26/2014 using  the [place] (analysis version: 14.10) manufactured by [employer]. The following summarizes the results of our evaluation.  PATIENT BIOGRAPHICAL: Name: KOOPER, CHRISWELL Patient ID: [id number] Birth Date: 1971/07/29 Height:    60.0 in. Gender:     Female    [age]       Weight:    132.0 lbs. Ethnicity:  White                            Exam Date: 08/26/2014  FRAX* RESULTS:  (version: 3.5) 10-year Probability of Fracture1 Major Osteoporotic Fracture2 Hip Fracture 3.0% 0.3% Population: [place] (Caucasian) Risk Factors: Secondary Osteoporosis  Based on Femur (Left) Neck BMD  1 -The 10-year probability of fracture may be lower than reported if the patient has received treatment. 2 -Major Osteoporotic Fracture: Clinical Spine, Forearm, Hip or Shoulder  *FRAX is a Materials engineer of the [employer] for Metabolic Bone Disease, a [place] (WHO) [facility].  ASSESSMENT: The probability of a major osteoporotic fracture is 3.0% within the next ten years.  The probability of a hip fracture is 0.3% within the next ten years.  RECOMMENDATIONS:  All treatment decisions require clinical judgement and consideration of the individual patient factors, including patient preferences, comorbidities, previous drug use, risk factors not captured in the FRAX model (e.g., frailty, falls, vitamin D deficiency, increased bone turnover, interval significant decline in bone density) and possible under- or over-estimation of fracture risk by FRAX.  In addition, the NOF Guide recommends that FDA -approved medical therapies be considered in postmenopausal women and men age >=50 years with a: *Hip or verterbral (clinical or morphometric) fracture *T-score of <+-2.5 at the spine or hip *Ten-year fracture probability by FRAX of >=3% for the hip fracture or >20% for major osteoportotic fracture.  FOLLOW-UP:  People with diagnosed cases of osteoporosis or osteopenia should be  regularly tested for bone mineral density. For patients eligible for Medicare, routine testing is allowed once every 2 years. Testing frequency can be increased for patients who have rapidly progressing disease, or for those who are receiving medical therapy to restore bone mass.  Based on these results, a follow-up exam is recommended in January 2018.  Sincerely,  Mark A. Thornton Papas, M.D.  Certified Clinical Densitometrist [facility]   Electronically Signed   By: Lavonia Dana M.D.   On: 08/26/2014 14:50     ASSESSMENT AND PLAN:  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 Stage IIIa (T3, N1) grade 3, poorly differentiated infiltrating ductal carcinoma the breast on the left with 2 of 3 positive lymph nodes. Both metastases greater than 3 mm but she also had extracapsular extension. No LV I was seen. ER +96%, PR +11%, Ki-67 20%, HER-2/neu nonamplified. Date of surgery was  on 10/08/2009. She had dose dense epirubicin/Cytoxan for 4 cycles followed by weekly Taxol for 12 weeks followed by radiation therapy by Dr. Kyung Rudd finishing on 06/19/2010. She then started tamoxifen on 06/23/2010 and it was proposed that she will continue this for 10 years, but her hormone status demonstrates a post-menopausal state and therefore, she was switched to Arimidex on 04/03/2014.Negative MRI of brain for metastatic disease.  Continue surveillance per NCCN guidelines.  Return in 6 months for follow-up.    Osteopenia Bone density exam demonstrates osteopenia.  She is giiven a prescription for OSCAL-D BID.  I have recommended the addition of Prolia, but due to cost at this time and her unemployement, she will consider this additional treatment choice.  She will continue with AI daily.    THERAPY PLAN:  NCCN guidelines recommends the following surveillance for invasive breast cancer:  A. History and Physical exam every 4-6 months for 5 years and then every 12 months.  B. Mammography every 12 months  C.  Women on Tamoxifen: annual gynecologic assessment every 12 months if uterus is present.  D. Women on aromatase inhibitor or who experience ovarian failure secondary to treatment should have monitoring of bone health with a bone mineral density determination at baseline and periodically thereafter.  E. Assess and encourage adherence to adjuvant endocrine therapy.  F. Evidence suggests that active lifestyle and achieving and maintaining an ideal body weight (20-25 BMI) may lead to optimal breast cancer outcomes.   All questions were answered. The patient knows to call the clinic with any problems, questions or concerns. We can certainly see the patient much sooner if necessary.  Patient and plan discussed with Dr. Ancil Linsey and she is in agreement with the aforementioned.   Arleth Mccullar 09/05/2014

## 2014-09-04 ENCOUNTER — Encounter (HOSPITAL_BASED_OUTPATIENT_CLINIC_OR_DEPARTMENT_OTHER): Payer: 59 | Admitting: Oncology

## 2014-09-04 VITALS — BP 112/71 | HR 75 | Temp 98.9°F | Resp 16 | Wt 133.6 lb

## 2014-09-04 DIAGNOSIS — C50912 Malignant neoplasm of unspecified site of left female breast: Secondary | ICD-10-CM

## 2014-09-04 DIAGNOSIS — M8580 Other specified disorders of bone density and structure, unspecified site: Secondary | ICD-10-CM

## 2014-09-04 MED ORDER — CALCIUM CARBONATE-VITAMIN D 500-200 MG-UNIT PO TABS: 1.0000 | ORAL_TABLET | Freq: Two times a day (BID) | ORAL | Status: DC

## 2014-09-04 NOTE — Patient Instructions (Signed)
[place] at [facility]  Discharge Instructions:  We reviewed your MRI of brain.  This is negative for any cancer. We reviewed your bone density test.  You are osteopenic which is a sign of weakening bones.  Continue exercise.  Take Calcium 1000-1200 mg and Vit D 400- 1000 units daily. I recommend Prolia every 6 months or Reclast every year.  Prolia is a shot.  Reclast is a 90 minute infusion. Follow-up with primary care provider as directed. Continue Arimidex daily. Return in 6 months for follow-up.  _______________________________________________________________  Thank you for choosing [place] at [facility] to provide your oncology and hematology care.  To afford each patient quality time with our providers, please arrive at least 15 minutes before your scheduled appointment.  You need to re-schedule your appointment if you arrive 10 or more minutes late.  We strive to give you quality time with our providers, and arriving late affects you and other patients whose appointments are after yours.  Also, if you no show three or more times for appointments you may be dismissed from the clinic.  Again, thank you for choosing [place] at [place] hope is that these requests will allow you access to exceptional care and in a timely manner. _______________________________________________________________  If you have questions after your visit, please contact our office at [phone] between the hours of 8:30 a.m. and 5:00 p.m. Voicemails left after 4:30 p.m. will not be returned until the following business day. _______________________________________________________________  For prescription refill requests, have your pharmacy contact our office. _______________________________________________________________  Recommendations made by the consultant and any test results will be sent to your referring  physician. _______________________________________________________________

## 2014-09-09 ENCOUNTER — Other Ambulatory Visit: Payer: Self-pay | Admitting: Internal Medicine

## 2014-09-09 ENCOUNTER — Encounter (HOSPITAL_COMMUNITY): Payer: Self-pay | Admitting: Oncology

## 2014-09-09 ENCOUNTER — Encounter: Payer: Self-pay | Admitting: Internal Medicine

## 2014-09-09 MED ORDER — LORAZEPAM 0.5 MG PO TABS: 1.0000 mg | ORAL_TABLET | Freq: Three times a day (TID) | ORAL | Status: DC | PRN

## 2014-09-09 MED ORDER — AMPHETAMINE-DEXTROAMPHETAMINE 10 MG PO TABS: 10.0000 mg | ORAL_TABLET | Freq: Two times a day (BID) | ORAL | Status: DC

## 2014-09-11 ENCOUNTER — Telehealth: Payer: Self-pay | Admitting: Internal Medicine

## 2014-09-11 NOTE — Telephone Encounter (Signed)
Pt has new insurance this yr and they have denied amphetamine-dextroamphetamine (ADDERALL) 10 MG tablet TID  [place]

## 2014-09-12 NOTE — Telephone Encounter (Signed)
PA approved [phone] for 1 year.  Patient is aware.

## 2014-09-17 ENCOUNTER — Ambulatory Visit (HOSPITAL_COMMUNITY)
Admission: RE | Admit: 2014-09-17 | Discharge: 2014-09-17 | Disposition: A | Discharge status: Home / Self Care | Payer: 59 | Source: Ambulatory Visit | Attending: Oncology | Admitting: Oncology

## 2014-09-17 DIAGNOSIS — F328 Other depressive episodes: Secondary | ICD-10-CM | POA: Diagnosis not present

## 2014-09-17 DIAGNOSIS — F32A Depression, unspecified: Secondary | ICD-10-CM

## 2014-09-17 DIAGNOSIS — R5383 Other fatigue: Secondary | ICD-10-CM

## 2014-09-17 DIAGNOSIS — F329 Major depressive disorder, single episode, unspecified: Secondary | ICD-10-CM

## 2014-09-17 NOTE — Therapy (Signed)
[place], [place], [address] Phone: [phone]   Fax:  [phone]  Physical Therapy Evaluation  Patient Details  Name: Renee [name] MRN: [id number] Date of Birth: 02/23/1971 Referring Provider:  Baird Cancer, PA-C  Encounter Date: 09/17/2014      PT End of Session - 09/17/14 1741    Visit Number 1   Number of Visits 1   PT Start Time 0940   PT Stop Time 1030   PT Time Calculation (min) 50 min      Past Medical History  Diagnosis Date  . Breast cancer     2010 ; left side mastectomy chemo pill  . Thyroid disease   . Hypothyroidism   . DVT (deep venous thrombosis)     left arm  . Depression   . Severe major depression 08/15/2014  . PTSD (post-traumatic stress disorder) 08/15/2014  . Osteopenia 09/01/2014    Past Surgical History  Procedure Laterality Date  . Mastectomy      left side  . Portacath placement    . Port-a-cath removal    . Breast reconstruction  01/2011    There were no vitals taken for this visit.  Visit Diagnosis:  Fatigue due to depression      Subjective Assessment - 09/17/14 1725    Symptoms Pt is in remission from breast cancer s/p resection, radiation and chemo.  She had worked up to running 3 miles a day and had full use of her arm when he husband found their step son who had overdosed on pain medication.  She states she has not been the same since.  She knows she is depressed but she can not stop thinking about why she survived breast cancer and her step son is dead.  After talking at length with pt it appears that her emotional state is more from the loss of her step son than from life changes after cancer.     How long can you sit comfortably? no problem   How long can you stand comfortably? no problem   How long can you walk comfortably? does not exercise any longer           [facility] PT Assessment - 09/17/14 0001    Assessment   Medical Diagnosis Breast cancer  survivor   Prior Therapy none   Precautions   Precautions None   Restrictions   Weight Bearing Restrictions No   Balance Screen   Has the patient fallen in the past 6 months No   Has the patient had a decrease in activity level because of a fear of falling?  No   Is the patient reluctant to leave their home because of a fear of falling?  No   Prior Function   Level of Independence Independent with basic ADLs   Leisure none now was jogging; spending time with children   Cognition   Overall Cognitive Status Within Functional Limits for tasks assessed   Observation/Other Assessments   Focus on Therapeutic Outcomes (FOTO)  --  TUG 6.8; VAS fatigue 1; VAS distress 6.6; FACIT F 25.8           PT Education - 09/17/14 1740    Education provided Yes   Education Details STAR booklet, pedometer,    Person(s) Educated Patient   Methods Explanation   Comprehension Verbalized understanding             Plan - 09/17/14 1741  Clinical Impression Statement Pt is a 44 yo female who appears to be very depressed.  Upon speaking to the pt her depression lies in losing her step son at such a young age and not from being a cancer survivior.  Pt is aware that she appears to be depressed but just can not shake it.  Pt states she would like to speak to a chaplain and I will contact Renee [name] to contact Renee [name].    PT Frequency One time visit         Problem List Patient Active Problem List   Diagnosis Date Noted  . Osteopenia 09/01/2014  . Severe major depression 08/15/2014  . PTSD (post-traumatic stress disorder) 08/15/2014  . DVT of upper extremity (deep vein thrombosis) 09/12/2012  . 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 Thyroid disease   . Hypothyroidism 02/24/2009  . [place] DISEASE 04/26/2008  . HYPERLIPIDEMIA 04/01/2008  . ANEMIA-IRON DEFICIENCY 04/01/2008  . Depression 04/01/2008  . TREMOR 04/01/2008  . NEPHROLITHIASIS, HX OF 04/01/2008     RUSSELL,CINDY PT  09/17/2014, 5:45 PM  [place] [address] [place], [place], [address] Phone: [phone]   Fax:  [phone]

## 2014-09-21 ENCOUNTER — Other Ambulatory Visit: Payer: Self-pay | Admitting: Internal Medicine

## 2014-10-08 ENCOUNTER — Telehealth: Payer: Self-pay | Admitting: Internal Medicine

## 2014-10-08 MED ORDER — AMPHETAMINE-DEXTROAMPHETAMINE 10 MG PO TABS: 10.0000 mg | ORAL_TABLET | Freq: Two times a day (BID) | ORAL | Status: DC

## 2014-10-08 NOTE — Telephone Encounter (Addendum)
Pt request refill  amphetamine-dextroamphetamine (ADDER ALL) 10 MG tablet Pt also scheduled 3 moROV.on march 31.  Does pt need to come in sooneer since she just started the adderal one month ago?  Pt states she is doing good.

## 2014-10-08 NOTE — Telephone Encounter (Signed)
Spoke to pt, told her Adderall Rx for one month supply is ready for pickup and to keep appt on March 31 st and at visit will give 3 month supply then. Pt verbalized understanding. Rx printed and signed.

## 2014-11-07 ENCOUNTER — Ambulatory Visit (INDEPENDENT_AMBULATORY_CARE_PROVIDER_SITE_OTHER): Payer: 59 | Admitting: Internal Medicine

## 2014-11-07 ENCOUNTER — Encounter: Payer: Self-pay | Admitting: *Deleted

## 2014-11-07 ENCOUNTER — Encounter: Payer: Self-pay | Admitting: Internal Medicine

## 2014-11-07 VITALS — BP 102/70 | HR 90 | Temp 98.0°F | Resp 18 | Ht 60.0 in | Wt 123.0 lb

## 2014-11-07 DIAGNOSIS — F988 Other specified behavioral and emotional disorders with onset usually occurring in childhood and adolescence: Secondary | ICD-10-CM | POA: Insufficient documentation

## 2014-11-07 DIAGNOSIS — F909 Attention-deficit hyperactivity disorder, unspecified type: Secondary | ICD-10-CM

## 2014-11-07 DIAGNOSIS — F32A Depression, unspecified: Secondary | ICD-10-CM

## 2014-11-07 DIAGNOSIS — F329 Major depressive disorder, single episode, unspecified: Secondary | ICD-10-CM

## 2014-11-07 MED ORDER — AMPHETAMINE-DEXTROAMPHETAMINE 10 MG PO TABS: 10.0000 mg | ORAL_TABLET | Freq: Two times a day (BID) | ORAL | Status: DC

## 2014-11-07 NOTE — Progress Notes (Signed)
Pre visit review using our clinic review tool, if applicable. No additional management support is needed unless otherwise documented below in the visit note. 

## 2014-11-07 NOTE — Progress Notes (Signed)
Subjective:    Patient ID: Renee [name], female    DOB: 08-01-1971, 44 y.o.   MRN: [id number]  HPI  Wt Readings from Last 3 Encounters:  11/07/14 123 lb (55.792 kg)  09/04/14 133 lb 9.6 oz (60.601 kg)  08/20/14 132 lb (59.50 kg)   44 year old patient who is seen today in follow-up.  She has a history of major depression and continues to be followed by hospice, her pastor and behavioral health.  She was given a trial of Adderall after she tried a friend's prescription and had a dramatic response.  She states "fog was lifted"and she felt much better as far as focusing.  Secondary anxiety and depression have also greatly improved.  She remains on a low dose of 10 mg twice daily and has done quite well.  No side effects of medications.  She has self discontinued Lexapro after a gradual taper and remains stable off this medication.  She remains on Wellbutrin.  Past Medical History  Diagnosis Date  . Breast cancer     2010 ; left side mastectomy chemo pill  . Thyroid disease   . Hypothyroidism   . DVT (deep venous thrombosis)     left arm  . Depression   . Severe major depression 08/15/2014  . PTSD (post-traumatic stress disorder) 08/15/2014  . Osteopenia 09/01/2014    History   Social History  . Marital Status: Divorced    Spouse Name: N/A  . Number of Children: N/A  . Years of Education: N/A   Occupational History  . Not on file.   Social History Main Topics  . Smoking status: Former Smoker -- 0.50 packs/day for 14 years    Quit date: 09/22/2010  . Smokeless tobacco: Never Used  . Alcohol Use: No  . Drug Use: No  . Sexual Activity: Not on file   Other Topics Concern  . Not on file   Social History Narrative    Past Surgical History  Procedure Laterality Date  . Mastectomy      left side  . Portacath placement    . Port-a-cath removal    . Breast reconstruction  01/2011    Family History  Problem Relation Age of Onset  . Hypertension Mother   . Diabetes  Father   . Stroke Father   . Hypertension Father   . Hypertension Maternal Grandmother   . Diabetes Paternal Grandmother   . Cancer Paternal Grandfather     No Known Allergies  Current Outpatient Prescriptions on File Prior to Visit  Medication Sig Dispense Refill  . anastrozole (ARIMIDEX) 1 MG tablet Take 1 tablet (1 mg total) by mouth daily. 30 tablet 5  . buPROPion (WELLBUTRIN XL) 150 MG 24 hr tablet TAKE ONE TABLET BY MOUTH DAILY 60 tablet 2  . calcium-vitamin D (OSCAL WITH D) 500-200 MG-UNIT per tablet Take 1 tablet by mouth 2 (two) times daily. 60 tablet 11  . levothyroxine (SYNTHROID, LEVOTHROID) 112 MCG tablet TAKE ONE TABLET BY MOUTH ONCE DAILY 90 tablet 1  . LORazepam (ATIVAN) 0.5 MG tablet Take 2 tablets (1 mg total) by mouth every 8 (eight) hours as needed for anxiety. 60 tablet 1  . Multiple Vitamins-Minerals (MULTIVITAMINS THER. W/MINERALS) TABS Take 1 tablet by mouth daily.      Marland Kitchen OVER THE COUNTER MEDICATION Take 1 capsule by mouth. Hair, Skin and Nail vitamin     No current facility-administered medications on file prior to visit.    BP 102/70 mmHg  Pulse 90  Temp(Src) 98 F (36.7 C) (Oral)  Resp 18  Ht 5' (1.524 m)  Wt 123 lb (55.792 kg)  BMI 24.02 kg/m2  SpO2 97%      Review of Systems  Constitutional: Negative.   HENT: Negative for congestion, dental problem, hearing loss, rhinorrhea, sinus pressure, sore throat and tinnitus.   Eyes: Negative for pain, discharge and visual disturbance.  Respiratory: Negative for cough and shortness of breath.   Cardiovascular: Negative for chest pain, palpitations and leg swelling.  Gastrointestinal: Negative for nausea, vomiting, abdominal pain, diarrhea, constipation, blood in stool and abdominal distention.  Genitourinary: Negative for dysuria, urgency, frequency, hematuria, flank pain, vaginal bleeding, vaginal discharge, difficulty urinating, vaginal pain and pelvic pain.  Musculoskeletal: Negative for joint  swelling, arthralgias and gait problem.  Skin: Negative for rash.  Neurological: Negative for dizziness, syncope, speech difficulty, weakness, numbness and headaches.  Hematological: Negative for adenopathy.  Psychiatric/Behavioral: Positive for behavioral problems and dysphoric mood. Negative for agitation. The patient is nervous/anxious.        Objective:   Physical Exam  Constitutional: She appears well-developed and well-nourished. No distress.  Cardiovascular: Normal rate and regular rhythm.   Pulmonary/Chest: Effort normal and breath sounds normal.  Lymphadenopathy:    She has no cervical adenopathy.  Psychiatric: She has a normal mood and affect. Her behavior is normal. Judgment and thought content normal.          Assessment & Plan:  Major depression.  Very nice clinical improvement.  Dramatic clinical response with Adderall.  Unclear whether this has benefited previously undiagnosed ADD or has been a positive effect on major depression.  We'll continue low-dose Adderall at this time Breast cancer.  Follow-up oncology Hypothyroidism.  Check TSH next visit

## 2014-11-07 NOTE — Patient Instructions (Signed)
Follow-up oncology and behavioral health as scheduled    It is important that you exercise regularly, at least 20 minutes 3 to 4 times per week.  If you develop chest pain or shortness of breath seek  medical attention.  Return in 6 months for follow-up

## 2015-01-07 ENCOUNTER — Other Ambulatory Visit: Payer: Self-pay | Admitting: Internal Medicine

## 2015-02-06 ENCOUNTER — Other Ambulatory Visit: Payer: Self-pay | Admitting: Internal Medicine

## 2015-02-13 ENCOUNTER — Other Ambulatory Visit: Payer: Self-pay | Admitting: Internal Medicine

## 2015-03-04 ENCOUNTER — Other Ambulatory Visit (HOSPITAL_COMMUNITY): Payer: Self-pay | Admitting: Oncology

## 2015-03-04 DIAGNOSIS — C50912 Malignant neoplasm of unspecified site of left female breast: Secondary | ICD-10-CM

## 2015-03-05 ENCOUNTER — Telehealth (HOSPITAL_COMMUNITY): Payer: Self-pay | Admitting: Hematology & Oncology

## 2015-03-05 ENCOUNTER — Encounter (HOSPITAL_COMMUNITY): Payer: 59 | Attending: Hematology & Oncology

## 2015-03-05 ENCOUNTER — Ambulatory Visit (HOSPITAL_COMMUNITY): Payer: 59 | Admitting: Hematology & Oncology

## 2015-03-05 ENCOUNTER — Encounter (HOSPITAL_BASED_OUTPATIENT_CLINIC_OR_DEPARTMENT_OTHER): Payer: 59 | Admitting: Hematology & Oncology

## 2015-03-05 VITALS — BP 133/81 | HR 82 | Temp 98.4°F | Resp 16 | Wt 115.2 lb

## 2015-03-05 DIAGNOSIS — Z170 Estrogen receptor positive status [ER+]: Secondary | ICD-10-CM | POA: Diagnosis not present

## 2015-03-05 DIAGNOSIS — C50919 Malignant neoplasm of unspecified site of unspecified female breast: Secondary | ICD-10-CM

## 2015-03-05 DIAGNOSIS — C773 Secondary and unspecified malignant neoplasm of axilla and upper limb lymph nodes: Secondary | ICD-10-CM | POA: Diagnosis not present

## 2015-03-05 DIAGNOSIS — C50912 Malignant neoplasm of unspecified site of left female breast: Secondary | ICD-10-CM | POA: Insufficient documentation

## 2015-03-05 DIAGNOSIS — M8580 Other specified disorders of bone density and structure, unspecified site: Secondary | ICD-10-CM | POA: Diagnosis not present

## 2015-03-05 LAB — COMPREHENSIVE METABOLIC PANEL
ALT: 16 U/L (ref 14–54)
ANION GAP: 11 (ref 5–15)
AST: 20 U/L (ref 15–41)
Albumin: 4.2 g/dL (ref 3.5–5.0)
Alkaline Phosphatase: 66 U/L (ref 38–126)
BILIRUBIN TOTAL: 0.7 mg/dL (ref 0.3–1.2)
BUN: 14 mg/dL (ref 6–20)
CO2: 25 mmol/L (ref 22–32)
Calcium: 9.9 mg/dL (ref 8.9–10.3)
Chloride: 103 mmol/L (ref 101–111)
Creatinine, Ser: 0.76 mg/dL (ref 0.44–1.00)
Glucose, Bld: 87 mg/dL (ref 65–99)
Potassium: 3.8 mmol/L (ref 3.5–5.1)
Sodium: 139 mmol/L (ref 135–145)
TOTAL PROTEIN: 6.9 g/dL (ref 6.5–8.1)

## 2015-03-05 LAB — CBC WITH DIFFERENTIAL/PLATELET
BASOS ABS: 0 10*3/uL (ref 0.0–0.1)
Basophils Relative: 0 % (ref 0–1)
EOS ABS: 0.1 10*3/uL (ref 0.0–0.7)
Eosinophils Relative: 1 % (ref 0–5)
HEMATOCRIT: 39.3 % (ref 36.0–46.0)
Hemoglobin: 13.3 g/dL (ref 12.0–15.0)
LYMPHS ABS: 2.2 10*3/uL (ref 0.7–4.0)
LYMPHS PCT: 27 % (ref 12–46)
MCH: 33.9 pg (ref 26.0–34.0)
MCHC: 33.8 g/dL (ref 30.0–36.0)
MCV: 100.3 fL — ABNORMAL HIGH (ref 78.0–100.0)
MONO ABS: 0.7 10*3/uL (ref 0.1–1.0)
MONOS PCT: 8 % (ref 3–12)
Neutro Abs: 5.1 10*3/uL (ref 1.7–7.7)
Neutrophils Relative %: 64 % (ref 43–77)
Platelets: 173 10*3/uL (ref 150–400)
RBC: 3.92 MIL/uL (ref 3.87–5.11)
RDW: 12.6 % (ref 11.5–15.5)
WBC: 8.1 10*3/uL (ref 4.0–10.5)

## 2015-03-05 NOTE — Progress Notes (Signed)
LABS DRAWN

## 2015-03-05 NOTE — Progress Notes (Signed)
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 Both metastases greater than 3 mm but she also had extracapsular extension. No LV I was seen. ER +96%, PR +11%, Ki-67 20%, HER-2/neu nonamplified. Date of surgery was on 10/08/2009. She had dose dense epirubicin/Cytoxan for 4 cycles followed by weekly Taxol for 12 weeks followed by radiation therapy by Dr. Kyung [name] finishing on 06/19/2010. She then started tamoxifen on 06/23/2010 and it was proposed that she will continue this for 10 years, but her hormone status demonstrates a post-menopausal state and therefore, she was switched to Arimidex on 04/03/2014.  The patient is alone today and says that she is active and she feels good.  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 She has had a L mastectomy with latissimus dorsi flap reconstruction and an R silicone implant, completed by Dr. Migdalia [name].  She used to take Tamoxifen and is currently on Arimidex.  She denies swelling in her left arm or any problems with breasts right now.  Her great aunt also had breast cancer and she has had genetic testing done.    The patient has never had a colonoscopy. She complains of possible carpal tunnel in her right wrist.  Her primary is Dr. Roxana [name] She takes calcium and Vitamin D The patient has no other complaints at this time.  Past Medical History  Diagnosis Date  . Breast cancer     2010 ; left side mastectomy chemo pill  . Thyroid disease   .  Hypothyroidism   . DVT (deep venous thrombosis)     left arm  . Depression   . Severe major depression 08/15/2014  . PTSD (post-traumatic stress disorder) 08/15/2014  . Osteopenia 09/01/2014    has GRAVES DISEASE; Hypothyroidism; HYPERLIPIDEMIA; ANEMIA-IRON DEFICIENCY; Depression; TREMOR; NEPHROLITHIASIS, HX OF; 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; Thyroid disease; DVT of upper extremity (deep vein thrombosis); Severe major depression; PTSD (post-traumatic stress disorder); Osteopenia; and ADD (attention deficit disorder) on her problem list.     has No Known Allergies.  Renee [name] does not currently have medications on file.  Past Surgical History  Procedure Laterality Date  . Mastectomy      left side  . Portacath placement    . Port-a-cath removal    . Breast reconstruction  01/2011    Denies any headaches, dizziness, double vision, fevers, chills, night sweats, nausea, vomiting, diarrhea, constipation, chest pain, heart palpitations, shortness of breath, blood in stool, black tarry stool, urinary pain, urinary burning, urinary frequency, hematuria. 14 point review of systems was performed and is negative except as detailed under history of present illness and above    PHYSICAL EXAMINATION  ECOG PERFORMANCE STATUS: 0 - Asymptomatic  Filed Vitals:   03/05/15 1245  BP: 133/81  Pulse: 82  Temp: 98.4 F (36.9 C)  Resp: 16    GENERAL:alert, no distress, well nourished, well developed, comfortable, cooperative and smiling SKIN: skin color, texture, turgor are normal, no rashes or significant lesions HEAD: Normocephalic, No masses, lesions, tenderness  or abnormalities EYES: normal, PERRLA, EOMI, Conjunctiva are pink and non-injected EARS: External ears normal OROPHARYNX:mucous membranes are moist  NECK: supple, trachea midline LYMPH:  No palpable LAD in the neck, supraclavicular or axillary regions BREAST: Bilateral breast exam is performed with no palpable  nodularity or masses. Left breast reconstruction is intact with no subcutaneous abnormalities noted. LUNGS: CTAB with no wheezing or rhonchi HEART: S1/S2 audible and regular, no ectopy, no murmurs ABDOMEN: Soft, nontender, nondistended, no palpable hepatosplenomegaly, no rebound or guarding. BACK: Back symmetric, no curvature. EXTREMITIES:less then 2 second capillary refill, no skin discoloration, no cyanosis  NEURO: alert & oriented x 3 with fluent speech, no focal motor/sensory deficits, gait normal   LABORATORY DATA: CBC    Component Value Date/Time   WBC 7.3 08/15/2014 1339   RBC 3.99 08/15/2014 1339   HGB 13.6 08/15/2014 1339   HCT 41.4 08/15/2014 1339   PLT 164 08/15/2014 1339   MCV 103.8* 08/15/2014 1339   MCH 34.1* 08/15/2014 1339   MCHC 32.9 08/15/2014 1339   RDW 13.1 08/15/2014 1339   LYMPHSABS 2.8 08/15/2014 1339   MONOABS 0.5 08/15/2014 1339   EOSABS 0.1 08/15/2014 1339   BASOSABS 0.0 08/15/2014 1339      Chemistry      Component Value Date/Time   NA 138 08/15/2014 1339   K 3.4* 08/15/2014 1339   CL 104 08/15/2014 1339   CO2 27 08/15/2014 1339   BUN 14 08/15/2014 1339   CREATININE 0.68 08/15/2014 1339      Component Value Date/Time   CALCIUM 8.8 08/15/2014 1339   ALKPHOS 57 08/15/2014 1339   AST 20 08/15/2014 1339   ALT 12 08/15/2014 1339   BILITOT 0.6 08/15/2014 1339       RADIOGRAPHIC STUDIES:  EXAM: DUAL X-RAY ABSORPTIOMETRY (DXA) FOR BONE MINERAL DENSITY  IMPRESSION: Ordering Physician: Baird Cancer PA-C,  Your patient Renee [name] completed a BMD test on 08/26/2014 using the [place] (software version: 14.10) manufactured by [employer]. The following summarizes the results of our evaluation. PATIENT BIOGRAPHICAL: Name: Renee, [name] Patient ID: [id number] Birth Date: 04/18/1971 Height: 60.0 in. Gender: Female Exam Date: 08/26/2014 Weight: 132.0 lbs. Indications: Caucasian, Hx Breast Ca, Low Calcium  Intake, Post Menopausal, Secondary Osteoporosis Fractures: Treatments: Arimidex DENSITOMETRY RESULTS: Site Region Measured Date Measured Age WHO Classification Young Adult T-score BMD %Change vs. Previous Significant Change (*) AP Spine L1-L4 08/26/2014 44.9 Normal -0.5 1.121 g/cm2  DualFemur Neck Left 08/26/2014 44.9 Osteopenia -1.4 0.843 g/cm2 ASSESSMENT: BMD as determined from Femur Neck Left is 0.843 g/cm2 with a T-Score of -1.4. This patient is considered osteopenic according to [facility]) criteria.  World Pharmacologist (WHO) criteria for post-menopausal, Caucasian Women: Normal: T-score at or above -1 SD Osteopenia: T-score between -1 and -2.5 SD Osteoporosis: T-score at or below -2.5 SD  Your patient BAILEE METTER Hazzard completed a FRAX assessment on 08/26/2014 using the [place] (analysis version: 14.10) manufactured by [employer]. The following summarizes the results of our evaluation.  PATIENT BIOGRAPHICAL: Name: BEVERLY, FERNER Patient ID: [id number] Birth Date: 1971-04-28 Height: 60.0 in. Gender: Female Age[age] Weight: 132.0 lbs. Ethnicity: White Exam Date: 08/26/2014  FRAX* RESULTS: (version: 3.5) 10-year Probability of Fracture1 Major Osteoporotic Fracture2 Hip Fracture 3.0% 0.3% Population: [place] (Caucasian) Risk Factors: Secondary Osteoporosis  Based on Femur (Left) Neck BMD  1 -The 10-year probability of fracture may be lower than reported if the patient has received  treatment. 2 -Major Osteoporotic Fracture: Clinical Spine, Forearm, Hip or Shoulder  *FRAX is a Materials engineer of the [employer] for Metabolic Bone Disease, a [place] (WHO) [facility].  ASSESSMENT: The probability of a major osteoporotic fracture is 3.0% within the next ten years.  The  probability of a hip fracture is 0.3% within the next ten years.  RECOMMENDATIONS:  All treatment decisions require clinical judgement and consideration of the individual patient factors, including patient preferences, comorbidities, previous drug use, risk factors not captured in the FRAX model (e.g., frailty, falls, vitamin D deficiency, increased bone turnover, interval significant decline in bone density) and possible under- or over-estimation of fracture risk by FRAX.  In addition, the NOF Guide recommends that FDA -approved medical therapies be considered in postmenopausal women and men age >=50 years with a: *Hip or verterbral (clinical or morphometric) fracture *T-score of <+-2.5 at the spine or hip *Ten-year fracture probability by FRAX of >=3% for the hip fracture or >20% for major osteoportotic fracture.  FOLLOW-UP:  People with diagnosed cases of osteoporosis or osteopenia should be regularly tested for bone mineral density. For patients eligible for Medicare, routine testing is allowed once every 2 years. Testing frequency can be increased for patients who have rapidly progressing disease, or for those who are receiving medical therapy to restore bone mass.  Based on these results, a follow-up exam is recommended in January 2018.  Sincerely,  Mark A. Thornton Papas, M.D.  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 She is agreeable to Prolia therapy. We discussed risks and benefits of Prolia including but not limited to osteonecrosis of the jaw. She receives appropriate dental care. I advised her to let her dentist be aware she is on the medication.  We discussed the duration of aromatase inhibitor therapy. She will complete 5 years of endocrine therapy this November. She is a  candidate for the breast cancer index. She would like to stay on her medication currently and we will discuss the BCI at her next visit.  She is to take calcium and vitamin D. She will continue on her aromatase inhibitor. Plan is detailed above. I will see her back in 6 months. Mammogram will be ordered for September.  Orders Placed This Encounter  Procedures  . MM Digital Screening Unilat R    Standing Status: Future     Number of Occurrences:      Standing Expiration Date: 03/04/2016    Order Specific Question:  Reason for Exam (SYMPTOM  OR DIAGNOSIS REQUIRED)    Answer:  screening, history of breast cancer    Order Specific Question:  Is the patient pregnant?    Answer:  No    Order Specific Question:  Preferred imaging location?    Answer:  [facility]  . CBC with Differential    Standing Status: Future     Number of Occurrences:      Standing Expiration Date: 03/04/2016  . Comprehensive metabolic panel    Standing Status: Future     Number of Occurrences:      Standing Expiration Date: 03/04/2016     THERAPY PLAN:  NCCN guidelines recommends the following surveillance for invasive breast cancer:  A. History and Physical exam every 4-6 months for 5 years and then every 12 months.  B. Mammography every 12 months  C. Women on Tamoxifen: annual gynecologic assessment every 12 months if uterus is present.  D. Women on aromatase inhibitor or who experience ovarian failure secondary to treatment should have monitoring of bone health with a bone mineral density determination at baseline and periodically thereafter.  E. Assess and encourage adherence to adjuvant endocrine therapy.  F. Evidence suggests that active lifestyle and achieving and maintaining an ideal body weight (20-25 BMI) may lead to optimal breast cancer outcomes.   All questions were answered. The patient knows to call the clinic with any problems, questions or concerns. We can certainly see the patient much  sooner if necessary.   This document serves as a record of services personally performed by Ancil Linsey, MD. It was created on her behalf by Janace Hoard, a trained medical scribe. The creation of this record is based on the scribe's personal observations and the provider's statements to them. This document has been checked and approved by the attending provider.  I have reviewed the above documentation for accuracy and completeness, and I agree with the above.  tis note was electronically signed.  Kelby Fam. Whitney Muse, MD

## 2015-03-05 NOTE — Telephone Encounter (Signed)
PC TO PTS PCP TO OBTAIN REF. REC'D REF# [id number] PC TO [employer] [facility] PT HAS $40 COPAY FOR INJ AND A $500 OOP WHICH HAS  BEEN MET.NO DED CALL REF# [id number]

## 2015-03-05 NOTE — Patient Instructions (Signed)
[place] at [facility] Discharge Instructions  RECOMMENDATIONS MADE BY THE CONSULTANT AND ANY TEST RESULTS WILL BE SENT TO YOUR REFERRING PHYSICIAN.  We will check on Prolia injection, whether or not it requires insurance authorization. If insurance will cover injection we will schedule you an appointment. If insurance does not cover it, per your request we will not schedule it because you have declined injection. Mammogram as scheduled. MD appointment and lab work in 6 months. Return as scheduled.  Thank you for choosing [place] at [facility] to provide your oncology and hematology care.  To afford each patient quality time with our provider, please arrive at least 15 minutes before your scheduled appointment time.    You need to re-schedule your appointment should you arrive 10 or more minutes late.  We strive to give you quality time with our providers, and arriving late affects you and other patients whose appointments are after yours.  Also, if you no show three or more times for appointments you may be dismissed from the clinic at the providers discretion.     Again, thank you for choosing [facility].  Our hope is that these requests will decrease the amount of time that you wait before being seen by our physicians.       _____________________________________________________________  Should you have questions after your visit to [facility], please contact our office at [phone] between the hours of 8:30 a.m. and 4:30 p.m.  Voicemails left after 4:30 p.m. will not be returned until the following business day.  For prescription refill requests, have your pharmacy contact our office.

## 2015-03-10 ENCOUNTER — Telehealth: Payer: Self-pay | Admitting: Internal Medicine

## 2015-03-10 NOTE — Telephone Encounter (Signed)
Pt needs new rx generic adderall 10 mg °

## 2015-03-11 MED ORDER — AMPHETAMINE-DEXTROAMPHETAMINE 10 MG PO TABS: 10.0000 mg | ORAL_TABLET | Freq: Two times a day (BID) | ORAL | Status: DC

## 2015-03-11 NOTE — Telephone Encounter (Signed)
Pt notified Rx's ready for pickup. Rx's printed and signed.  

## 2015-03-18 ENCOUNTER — Encounter (HOSPITAL_COMMUNITY): Payer: Self-pay

## 2015-03-18 ENCOUNTER — Encounter (HOSPITAL_COMMUNITY): Payer: 59 | Attending: Hematology & Oncology

## 2015-03-18 VITALS — BP 122/77 | HR 92 | Temp 98.5°F | Resp 16

## 2015-03-18 DIAGNOSIS — C50912 Malignant neoplasm of unspecified site of left female breast: Secondary | ICD-10-CM | POA: Insufficient documentation

## 2015-03-18 DIAGNOSIS — M8580 Other specified disorders of bone density and structure, unspecified site: Secondary | ICD-10-CM | POA: Diagnosis not present

## 2015-03-18 MED ORDER — SODIUM CHLORIDE 0.9 % IV SOLN: Freq: Once | INTRAVENOUS | Status: DC

## 2015-03-18 MED ORDER — DENOSUMAB 60 MG/ML ~~LOC~~ SOLN
60.0000 mg | Freq: Once | SUBCUTANEOUS | Status: AC
  Administered 2015-03-18: 60 mg via SUBCUTANEOUS
  Filled 2015-03-18: qty 1

## 2015-03-18 NOTE — Progress Notes (Signed)
Renee [name] presents today for injection per the provider's orders.  Prolia administration without incident; see MAR for injection details.  Patient tolerated procedure well and without incident.  No questions or complaints noted at this time.

## 2015-03-18 NOTE — Patient Instructions (Signed)
[place] at [facility] Discharge Instructions  RECOMMENDATIONS MADE BY THE CONSULTANT AND ANY TEST RESULTS WILL BE SENT TO YOUR REFERRING PHYSICIAN.  Prolia injection today. Return as scheduled for lab work, office visit, and injection.  Thank you for choosing [place] at [facility] to provide your oncology and hematology care.  To afford each patient quality time with our provider, please arrive at least 15 minutes before your scheduled appointment time.    You need to re-schedule your appointment should you arrive 10 or more minutes late.  We strive to give you quality time with our providers, and arriving late affects you and other patients whose appointments are after yours.  Also, if you no show three or more times for appointments you may be dismissed from the clinic at the providers discretion.     Again, thank you for choosing [facility].  Our hope is that these requests will decrease the amount of time that you wait before being seen by our physicians.       _____________________________________________________________  Should you have questions after your visit to [facility], please contact our office at [phone] between the hours of 8:30 a.m. and 4:30 p.m.  Voicemails left after 4:30 p.m. will not be returned until the following business day.  For prescription refill requests, have your pharmacy contact our office.

## 2015-04-04 ENCOUNTER — Encounter (HOSPITAL_COMMUNITY): Payer: Self-pay | Admitting: Hematology & Oncology

## 2015-04-05 ENCOUNTER — Other Ambulatory Visit (HOSPITAL_COMMUNITY): Payer: Self-pay | Admitting: Oncology

## 2015-04-08 ENCOUNTER — Telehealth: Payer: Self-pay

## 2015-04-08 MED ORDER — LORAZEPAM 0.5 MG PO TABS: ORAL_TABLET | ORAL | Status: DC

## 2015-04-08 NOTE — Telephone Encounter (Signed)
Rx called in to pharmacy. 

## 2015-04-08 NOTE — Telephone Encounter (Signed)
Refill Lorazepam 0.5mg  Take two tablets by mouth every 8 hours as needed for anxiety #60  [facility]- [address]

## 2015-04-08 NOTE — Addendum Note (Signed)
Addended by: Marian Sorrow on: 04/08/2015 04:37 PM   Modules accepted: Orders

## 2015-04-21 ENCOUNTER — Ambulatory Visit (HOSPITAL_COMMUNITY): Payer: 59

## 2015-04-21 ENCOUNTER — Ambulatory Visit (HOSPITAL_COMMUNITY)
Admission: RE | Admit: 2015-04-21 | Discharge: 2015-04-21 | Disposition: A | Discharge status: Home / Self Care | Payer: 59 | Source: Ambulatory Visit | Attending: Hematology & Oncology | Admitting: Hematology & Oncology

## 2015-04-21 ENCOUNTER — Other Ambulatory Visit (HOSPITAL_COMMUNITY): Payer: Self-pay | Admitting: Hematology & Oncology

## 2015-04-21 DIAGNOSIS — Z1231 Encounter for screening mammogram for malignant neoplasm of breast: Secondary | ICD-10-CM | POA: Diagnosis present

## 2015-04-22 ENCOUNTER — Ambulatory Visit (HOSPITAL_COMMUNITY): Payer: 59

## 2015-05-07 ENCOUNTER — Ambulatory Visit: Payer: 59 | Admitting: Internal Medicine

## 2015-05-07 ENCOUNTER — Other Ambulatory Visit (HOSPITAL_COMMUNITY): Payer: Self-pay | Admitting: Oncology

## 2015-05-08 ENCOUNTER — Ambulatory Visit (INDEPENDENT_AMBULATORY_CARE_PROVIDER_SITE_OTHER): Payer: 59 | Admitting: Internal Medicine

## 2015-05-08 ENCOUNTER — Other Ambulatory Visit: Payer: Self-pay | Admitting: *Deleted

## 2015-05-08 ENCOUNTER — Ambulatory Visit: Payer: 59 | Admitting: Internal Medicine

## 2015-05-08 ENCOUNTER — Encounter: Payer: Self-pay | Admitting: Internal Medicine

## 2015-05-08 VITALS — BP 110/70 | HR 87 | Temp 98.4°F | Resp 18 | Ht 60.0 in | Wt 116.0 lb

## 2015-05-08 DIAGNOSIS — E039 Hypothyroidism, unspecified: Secondary | ICD-10-CM

## 2015-05-08 DIAGNOSIS — F988 Other specified behavioral and emotional disorders with onset usually occurring in childhood and adolescence: Secondary | ICD-10-CM

## 2015-05-08 DIAGNOSIS — F4310 Post-traumatic stress disorder, unspecified: Secondary | ICD-10-CM | POA: Diagnosis not present

## 2015-05-08 DIAGNOSIS — F909 Attention-deficit hyperactivity disorder, unspecified type: Secondary | ICD-10-CM

## 2015-05-08 LAB — TSH: TSH: 0.16 u[IU]/mL — AB (ref 0.35–4.50)

## 2015-05-08 MED ORDER — BUPROPION HCL ER (XL) 150 MG PO TB24: 150.0000 mg | ORAL_TABLET | Freq: Every day | ORAL | Status: DC

## 2015-05-08 MED ORDER — LORAZEPAM 0.5 MG PO TABS: ORAL_TABLET | ORAL | Status: DC

## 2015-05-08 MED ORDER — AMPHETAMINE-DEXTROAMPHETAMINE 10 MG PO TABS: 10.0000 mg | ORAL_TABLET | Freq: Two times a day (BID) | ORAL | Status: DC

## 2015-05-08 MED ORDER — LEVOTHYROXINE SODIUM 100 MCG PO TABS: 100.0000 ug | ORAL_TABLET | Freq: Every day | ORAL | Status: DC

## 2015-05-08 NOTE — Progress Notes (Signed)
Subjective:    Patient ID: Renee [name], female    DOB: 1970/10/31, 44 y.o.   MRN: [id number]  HPI  44 year old patient who is followed by oncology for left breast cancer.  She has a history of hypothyroidism following treatment for Graves' disease.  She has major depression. She continues to do well on low-dose Adderall for ADHD. She no longer is being followed by behavioral health.  Her depression is about the same  Past Medical History  Diagnosis Date  . Breast cancer     2010 ; left side mastectomy chemo pill  . Thyroid disease   . Hypothyroidism   . DVT (deep venous thrombosis)     left arm  . Depression   . Severe major depression 08/15/2014  . PTSD (post-traumatic stress disorder) 08/15/2014  . Osteopenia 09/01/2014    Social History   Social History  . Marital Status: Divorced    Spouse Name: N/A  . Number of Children: N/A  . Years of Education: N/A   Occupational History  . Not on file.   Social History Main Topics  . Smoking status: Former Smoker -- 0.50 packs/day for 14 years    Quit date: 09/22/2010  . Smokeless tobacco: Never Used  . Alcohol Use: No  . Drug Use: No  . Sexual Activity: Not on file   Other Topics Concern  . Not on file   Social History Narrative    Past Surgical History  Procedure Laterality Date  . Mastectomy      left side  . Portacath placement    . Port-a-cath removal    . Breast reconstruction  01/2011    Family History  Problem Relation Age of Onset  . Hypertension Mother   . Diabetes Father   . Stroke Father   . Hypertension Father   . Hypertension Maternal Grandmother   . Diabetes Paternal Grandmother   . Cancer Paternal Grandfather     No Known Allergies  Current Outpatient Prescriptions on File Prior to Visit  Medication Sig Dispense Refill  . anastrozole (ARIMIDEX) 1 MG tablet TAKE ONE TABLET BY MOUTH ONCE DAILY 30 tablet 0  . buPROPion (WELLBUTRIN XL) 150 MG 24 hr tablet TAKE ONE TABLET BY MOUTH ONCE DAILY  60 tablet 0  . calcium-vitamin D (OSCAL WITH D) 500-200 MG-UNIT per tablet Take 1 tablet by mouth 2 (two) times daily. 60 tablet 11  . levothyroxine (SYNTHROID, LEVOTHROID) 112 MCG tablet TAKE ONE TABLET BY MOUTH ONCE DAILY 90 tablet 1   No current facility-administered medications on file prior to visit.    BP 110/70 mmHg  Pulse 87  Temp(Src) 98.4 F (36.9 C) (Oral)  Resp 18  Ht 5' (1.524 m)  Wt 116 lb (52.617 kg)  BMI 22.65 kg/m2  SpO2 98%     Review of Systems  Constitutional: Negative.   HENT: Negative for congestion, dental problem, hearing loss, rhinorrhea, sinus pressure, sore throat and tinnitus.   Eyes: Negative for pain, discharge and visual disturbance.  Respiratory: Negative for cough and shortness of breath.   Cardiovascular: Negative for chest pain, palpitations and leg swelling.  Gastrointestinal: Negative for nausea, vomiting, abdominal pain, diarrhea, constipation, blood in stool and abdominal distention.  Genitourinary: Negative for dysuria, urgency, frequency, hematuria, flank pain, vaginal bleeding, vaginal discharge, difficulty urinating, vaginal pain and pelvic pain.  Musculoskeletal: Negative for joint swelling, arthralgias and gait problem.  Skin: Negative for rash.  Neurological: Negative for dizziness, syncope, speech difficulty, weakness, numbness and  headaches.  Hematological: Negative for adenopathy.  Psychiatric/Behavioral: Positive for dysphoric mood. Negative for behavioral problems and agitation. The patient is not nervous/anxious.        Objective:   Physical Exam  Constitutional: She is oriented to person, place, and time. She appears well-developed and well-nourished.  HENT:  Head: Normocephalic.  Right Ear: External ear normal.  Left Ear: External ear normal.  Mouth/Throat: Oropharynx is clear and moist.  Eyes: Conjunctivae and EOM are normal. Pupils are equal, round, and reactive to light.  Neck: Normal range of motion. Neck supple.  No thyromegaly present.  Cardiovascular: Normal rate, regular rhythm, normal heart sounds and intact distal pulses.   Pulmonary/Chest: Effort normal and breath sounds normal.  Abdominal: Soft. Bowel sounds are normal. She exhibits no mass. There is no tenderness.  Musculoskeletal: Normal range of motion.  Lymphadenopathy:    She has no cervical adenopathy.  Neurological: She is alert and oriented to person, place, and time.  Skin: Skin is warm and dry. No rash noted.  Psychiatric: She has a normal mood and affect. Her behavior is normal.          Assessment & Plan:   Hypothyroidism status post I-131.  Will check a TSH.  Continue supplementation Major depression.  No change in therapy.  Behavioral health follow-up.  Strongly encouraged History of breast cancer.  Follow-up oncology  Return here 6 months or as needed

## 2015-05-08 NOTE — Progress Notes (Signed)
Pre visit review using our clinic review tool, if applicable. No additional management support is needed unless otherwise documented below in the visit note. 

## 2015-05-08 NOTE — Patient Instructions (Signed)
Oncology behavioral health follow-up    It is important that you exercise regularly, at least 20 minutes 3 to 4 times per week.  If you develop chest pain or shortness of breath seek  medical attention.  Return in 6 months for follow-up

## 2015-05-09 ENCOUNTER — Ambulatory Visit: Payer: 59 | Admitting: Internal Medicine

## 2015-06-04 ENCOUNTER — Other Ambulatory Visit (HOSPITAL_COMMUNITY): Payer: Self-pay | Admitting: Oncology

## 2015-07-09 ENCOUNTER — Other Ambulatory Visit: Payer: Self-pay | Admitting: Obstetrics

## 2015-07-09 LAB — HM PAP SMEAR: HM PAP: NEGATIVE

## 2015-07-10 LAB — CYTOLOGY - PAP

## 2015-07-17 ENCOUNTER — Telehealth (HOSPITAL_COMMUNITY): Payer: Self-pay

## 2015-07-17 NOTE — Telephone Encounter (Signed)
Sounds good. If anything changes, she is to call.  Robynn Pane, PA-C 07/17/2015 5:11 PM

## 2015-07-17 NOTE — Telephone Encounter (Signed)
Call from patient with complaints of tightness in her left arm near the  antecubital space.  Started today.  Is worried that it might the same problem she had before that they thought was a clot.  Denies any redness, swelling or any other discomfort.  Encouraged to elevate arm, use heat and/or ice and to extend and flex the arm to see if it helps.  Wants to know if there is anything else she needs to do.

## 2015-08-19 ENCOUNTER — Other Ambulatory Visit: Payer: Self-pay | Admitting: Internal Medicine

## 2015-08-19 MED ORDER — AMPHETAMINE-DEXTROAMPHETAMINE 10 MG PO TABS: 10.0000 mg | ORAL_TABLET | Freq: Two times a day (BID) | ORAL | Status: DC

## 2015-08-19 NOTE — Addendum Note (Signed)
Addended by: Marian Sorrow on: 08/19/2015 04:32 PM   Modules accepted: Orders

## 2015-08-21 ENCOUNTER — Encounter: Payer: Self-pay | Admitting: Internal Medicine

## 2015-08-21 ENCOUNTER — Ambulatory Visit (INDEPENDENT_AMBULATORY_CARE_PROVIDER_SITE_OTHER): Payer: BLUE CROSS/BLUE SHIELD | Admitting: Internal Medicine

## 2015-08-21 VITALS — BP 110/74 | HR 84 | Temp 98.1°F | Resp 20 | Ht 60.0 in | Wt 117.0 lb

## 2015-08-21 DIAGNOSIS — E032 Hypothyroidism due to medicaments and other exogenous substances: Secondary | ICD-10-CM

## 2015-08-21 DIAGNOSIS — C50912 Malignant neoplasm of unspecified site of left female breast: Secondary | ICD-10-CM | POA: Diagnosis not present

## 2015-08-21 DIAGNOSIS — M5441 Lumbago with sciatica, right side: Secondary | ICD-10-CM

## 2015-08-21 MED ORDER — HYDROCODONE-ACETAMINOPHEN 5-325 MG PO TABS: 1.0000 | ORAL_TABLET | Freq: Four times a day (QID) | ORAL | Status: DC | PRN

## 2015-08-21 MED ORDER — METHYLPREDNISOLONE ACETATE 80 MG/ML IJ SUSP
80.0000 mg | Freq: Once | INTRAMUSCULAR | Status: AC
  Administered 2015-08-21: 80 mg via INTRAMUSCULAR

## 2015-08-21 NOTE — Progress Notes (Signed)
Pre visit review using our clinic review tool, if applicable. No additional management support is needed unless otherwise documented below in the visit note. 

## 2015-08-21 NOTE — Patient Instructions (Signed)
Sciatica Sciatica is pain, weakness, numbness, or tingling along the path of the sciatic nerve. The nerve starts in the lower back and runs down the back of each leg. The nerve controls the muscles in the lower leg and in the back of the knee, while also providing sensation to the back of the thigh, lower leg, and the sole of your foot. Sciatica is a symptom of another medical condition. For instance, nerve damage or certain conditions, such as a herniated disk or bone spur on the spine, pinch or put pressure on the sciatic nerve. This causes the pain, weakness, or other sensations normally associated with sciatica. Generally, sciatica only affects one side of the body. CAUSES   Herniated or slipped disc.  Degenerative disk disease.  A pain disorder involving the narrow muscle in the buttocks (piriformis syndrome).  Pelvic injury or fracture.  Pregnancy.  Tumor (rare). SYMPTOMS  Symptoms can vary from mild to very severe. The symptoms usually travel from the low back to the buttocks and down the back of the leg. Symptoms can include:  Mild tingling or dull aches in the lower back, leg, or hip.  Numbness in the back of the calf or sole of the foot.  Burning sensations in the lower back, leg, or hip.  Sharp pains in the lower back, leg, or hip.  Leg weakness.  Severe back pain inhibiting movement. These symptoms may get worse with coughing, sneezing, laughing, or prolonged sitting or standing. Also, being overweight may worsen symptoms. DIAGNOSIS  Your caregiver will perform a physical exam to look for common symptoms of sciatica. He or she may ask you to do certain movements or activities that would trigger sciatic nerve pain. Other tests may be performed to find the cause of the sciatica. These may include:  Blood tests.  X-rays.  Imaging tests, such as an MRI or CT scan. TREATMENT  Treatment is directed at the cause of the sciatic pain. Sometimes, treatment is not necessary  and the pain and discomfort goes away on its own. If treatment is needed, your caregiver may suggest:  Over-the-counter medicines to relieve pain.  Prescription medicines, such as anti-inflammatory medicine, muscle relaxants, or narcotics.  Applying heat or ice to the painful area.  Steroid injections to lessen pain, irritation, and inflammation around the nerve.  Reducing activity during periods of pain.  Exercising and stretching to strengthen your abdomen and improve flexibility of your spine. Your caregiver may suggest losing weight if the extra weight makes the back pain worse.  Physical therapy.  Surgery to eliminate what is pressing or pinching the nerve, such as a bone spur or part of a herniated disk. HOME CARE INSTRUCTIONS   Only take over-the-counter or prescription medicines for pain or discomfort as directed by your caregiver.  Apply ice to the affected area for 20 minutes, 3-4 times a day for the first 48-72 hours. Then try heat in the same way.  Exercise, stretch, or perform your usual activities if these do not aggravate your pain.  Attend physical therapy sessions as directed by your caregiver.  Keep all follow-up appointments as directed by your caregiver.  Do not wear high heels or shoes that do not provide proper support.  Check your mattress to see if it is too soft. A firm mattress may lessen your pain and discomfort. SEEK IMMEDIATE MEDICAL CARE IF:   You lose control of your bowel or bladder (incontinence).  You have increasing weakness in the lower back, pelvis, buttocks,   or legs.  You have redness or swelling of your back.  You have a burning sensation when you urinate.  You have pain that gets worse when you lie down or awakens you at night.  Your pain is worse than you have experienced in the past.  Your pain is lasting longer than 4 weeks.  You are suddenly losing weight without reason. MAKE SURE YOU:  Understand these  instructions.  Will watch your condition.  Will get help right away if you are not doing well or get worse.   This information is not intended to replace advice given to you by your health care provider. Make sure you discuss any questions you have with your health care provider.   Document Released: 07/20/2001 Document Revised: 04/16/2015 Document Reviewed: 12/05/2011 Elsevier Interactive Patient Education 2016 [place] The sciatic nerve runs from the back down the leg and is responsible for sensation and control of the muscles in the back (posterior) side of the thigh, lower leg, and foot. Sciatica is a condition that is characterized by inflammation of this nerve.  SYMPTOMS   Signs of nerve damage, including numbness and/or weakness along the posterior side of the lower extremity.  Pain in the back of the thigh that may also travel down the leg.  Pain that worsens when sitting for long periods of time.  Occasionally, pain in the back or buttock. CAUSES  Inflammation of the sciatic nerve is the cause of sciatica. The inflammation is due to something irritating the nerve. Common sources of irritation include:  Sitting for long periods of time.  Direct trauma to the nerve.  Arthritis of the spine.  Herniated or ruptured disk.  Slipping of the vertebrae (spondylolisthesis).  Pressure from soft tissues, such as muscles or ligament-like tissue (fascia). RISK INCREASES WITH:  Sports that place pressure or stress on the spine (football or weightlifting).  Poor strength and flexibility.  Failure to warm up properly before activity.  Family history of low back pain or disk disorders.  Previous back injury or surgery.  Poor body mechanics, especially when lifting, or poor posture. PREVENTION   Warm up and stretch properly before activity.  Maintain physical fitness:  Strength, flexibility, and endurance.  Cardiovascular fitness.  Learn and  use proper technique, especially with posture and lifting. When possible, have coach correct improper technique.  Avoid activities that place stress on the spine. PROGNOSIS If treated properly, then sciatica usually resolves within 6 weeks. However, occasionally surgery is necessary.  RELATED COMPLICATIONS   Permanent nerve damage, including pain, numbness, tingle, or weakness.  Chronic back pain.  Risks of surgery: infection, bleeding, nerve damage, or damage to surrounding tissues. TREATMENT Treatment initially involves resting from any activities that aggravate your symptoms. The use of ice and medication may help reduce pain and inflammation. The use of strengthening and stretching exercises may help reduce pain with activity. These exercises may be performed at home or with referral to a therapist. A therapist may recommend further treatments, such as transcutaneous electronic nerve stimulation (TENS) or ultrasound. Your caregiver may recommend corticosteroid injections to help reduce inflammation of the sciatic nerve. If symptoms persist despite non-surgical (conservative) treatment, then surgery may be recommended. MEDICATION  If pain medication is necessary, then nonsteroidal anti-inflammatory medications, such as aspirin and ibuprofen, or other minor pain relievers, such as acetaminophen, are often recommended.  Do not take pain medication for 7 days before surgery.  Prescription pain relievers may be given if deemed  necessary by your caregiver. Use only as directed and only as much as you need.  Ointments applied to the skin may be helpful.  Corticosteroid injections may be given by your caregiver. These injections should be reserved for the most serious cases, because they may only be given a certain number of times. HEAT AND COLD  Cold treatment (icing) relieves pain and reduces inflammation. Cold treatment should be applied for 10 to 15 minutes every 2 to 3 hours for  inflammation and pain and immediately after any activity that aggravates your symptoms. Use ice packs or massage the area with a piece of ice (ice massage).  Heat treatment may be used prior to performing the stretching and strengthening activities prescribed by your caregiver, physical therapist, or athletic trainer. Use a heat pack or soak the injury in warm water. SEEK MEDICAL CARE IF:  Treatment seems to offer no benefit, or the condition worsens.  Any medications produce adverse side effects. EXERCISES  RANGE OF MOTION (ROM) AND STRETCHING EXERCISES - Sciatica Most people with sciatic will find that their symptoms worsen with either excessive bending forward (flexion) or arching at the low back (extension). The exercises which will help resolve your symptoms will focus on the opposite motion. Your physician, physical therapist or athletic trainer will help you determine which exercises will be most helpful to resolve your low back pain. Do not complete any exercises without first consulting with your clinician. Discontinue any exercises which worsen your symptoms until you speak to your clinician. If you have pain, numbness or tingling which travels down into your buttocks, leg or foot, the goal of the therapy is for these symptoms to move closer to your back and eventually resolve. Occasionally, these leg symptoms will get better, but your low back pain may worsen; this is typically an indication of progress in your rehabilitation. Be certain to be very alert to any changes in your symptoms and the activities in which you participated in the 24 hours prior to the change. Sharing this information with your clinician will allow him/her to most efficiently treat your condition. These exercises may help you when beginning to rehabilitate your injury. Your symptoms may resolve with or without further involvement from your physician, physical therapist or athletic trainer. While completing these  exercises, remember:   Restoring tissue flexibility helps normal motion to return to the joints. This allows healthier, less painful movement and activity.  An effective stretch should be held for at least 30 seconds.  A stretch should never be painful. You should only feel a gentle lengthening or release in the stretched tissue. FLEXION RANGE OF MOTION AND STRETCHING EXERCISES: STRETCH - Flexion, Single Knee to Chest   Lie on a firm bed or floor with both legs extended in front of you.  Keeping one leg in contact with the floor, bring your opposite knee to your chest. Hold your leg in place by either grabbing behind your thigh or at your knee.  Pull until you feel a gentle stretch in your low back. Hold __________ seconds.  Slowly release your grasp and repeat the exercise with the opposite side. Repeat __________ times. Complete this exercise __________ times per day.  STRETCH - Flexion, Double Knee to Chest  Lie on a firm bed or floor with both legs extended in front of you.  Keeping one leg in contact with the floor, bring your opposite knee to your chest.  Tense your stomach muscles to support your back and then lift your  other knee to your chest. Hold your legs in place by either grabbing behind your thighs or at your knees.  Pull both knees toward your chest until you feel a gentle stretch in your low back. Hold __________ seconds.  Tense your stomach muscles and slowly return one leg at a time to the floor. Repeat __________ times. Complete this exercise __________ times per day.  STRETCH - Low Trunk Rotation   Lie on a firm bed or floor. Keeping your legs in front of you, bend your knees so they are both pointed toward the ceiling and your feet are flat on the floor.  Extend your arms out to the side. This will stabilize your upper body by keeping your shoulders in contact with the floor.  Gently and slowly drop both knees together to one side until you feel a gentle  stretch in your low back. Hold for __________ seconds.  Tense your stomach muscles to support your low back as you bring your knees back to the starting position. Repeat the exercise to the other side. Repeat __________ times. Complete this exercise __________ times per day  EXTENSION RANGE OF MOTION AND FLEXIBILITY EXERCISES: STRETCH - Extension, Prone on Elbows  Lie on your stomach on the floor, a bed will be too soft. Place your palms about shoulder width apart and at the height of your head.  Place your elbows under your shoulders. If this is too painful, stack pillows under your chest.  Allow your body to relax so that your hips drop lower and make contact more completely with the floor.  Hold this position for __________ seconds.  Slowly return to lying flat on the floor. Repeat __________ times. Complete this exercise __________ times per day.  RANGE OF MOTION - Extension, Prone Press Ups  Lie on your stomach on the floor, a bed will be too soft. Place your palms about shoulder width apart and at the height of your head.  Keeping your back as relaxed as possible, slowly straighten your elbows while keeping your hips on the floor. You may adjust the placement of your hands to maximize your comfort. As you gain motion, your hands will come more underneath your shoulders.  Hold this position __________ seconds.  Slowly return to lying flat on the floor. Repeat __________ times. Complete this exercise __________ times per day.  STRENGTHENING EXERCISES - Sciatica  These exercises may help you when beginning to rehabilitate your injury. These exercises should be done near your "sweet spot." This is the neutral, low-back arch, somewhere between fully rounded and fully arched, that is your least painful position. When performed in this safe range of motion, these exercises can be used for people who have either a flexion or extension based injury. These exercises may resolve your symptoms  with or without further involvement from your physician, physical therapist or athletic trainer. While completing these exercises, remember:   Muscles can gain both the endurance and the strength needed for everyday activities through controlled exercises.  Complete these exercises as instructed by your physician, physical therapist or athletic trainer. Progress with the resistance and repetition exercises only as your caregiver advises.  You may experience muscle soreness or fatigue, but the pain or discomfort you are trying to eliminate should never worsen during these exercises. If this pain does worsen, stop and make certain you are following the directions exactly. If the pain is still present after adjustments, discontinue the exercise until you can discuss the trouble with your clinician.  STRENGTHENING - Deep Abdominals, Pelvic Tilt   Lie on a firm bed or floor. Keeping your legs in front of you, bend your knees so they are both pointed toward the ceiling and your feet are flat on the floor.  Tense your lower abdominal muscles to press your low back into the floor. This motion will rotate your pelvis so that your tail bone is scooping upwards rather than pointing at your feet or into the floor.  With a gentle tension and even breathing, hold this position for __________ seconds. Repeat __________ times. Complete this exercise __________ times per day.  STRENGTHENING - Abdominals, Crunches   Lie on a firm bed or floor. Keeping your legs in front of you, bend your knees so they are both pointed toward the ceiling and your feet are flat on the floor. Cross your arms over your chest.  Slightly tip your chin down without bending your neck.  Tense your abdominals and slowly lift your trunk high enough to just clear your shoulder blades. Lifting higher can put excessive stress on the low back and does not further strengthen your abdominal muscles.  Control your return to the starting  position. Repeat __________ times. Complete this exercise __________ times per day.  STRENGTHENING - Quadruped, Opposite UE/LE Lift  Assume a hands and knees position on a firm surface. Keep your hands under your shoulders and your knees under your hips. You may place padding under your knees for comfort.  Find your neutral spine and gently tense your abdominal muscles so that you can maintain this position. Your shoulders and hips should form a rectangle that is parallel with the floor and is not twisted.  Keeping your trunk steady, lift your right hand no higher than your shoulder and then your left leg no higher than your hip. Make sure you are not holding your breath. Hold this position __________ seconds.  Continuing to keep your abdominal muscles tense and your back steady, slowly return to your starting position. Repeat with the opposite arm and leg. Repeat __________ times. Complete this exercise __________ times per day.  STRENGTHENING - Abdominals and Quadriceps, Straight Leg Raise   Lie on a firm bed or floor with both legs extended in front of you.  Keeping one leg in contact with the floor, bend the other knee so that your foot can rest flat on the floor.  Find your neutral spine, and tense your abdominal muscles to maintain your spinal position throughout the exercise.  Slowly lift your straight leg off the floor about 6 inches for a count of 15, making sure to not hold your breath.  Still keeping your neutral spine, slowly lower your leg all the way to the floor. Repeat this exercise with each leg __________ times. Complete this exercise __________ times per day. POSTURE AND BODY MECHANICS CONSIDERATIONS - Sciatica Keeping correct posture when sitting, standing or completing your activities will reduce the stress put on different body tissues, allowing injured tissues a chance to heal and limiting painful experiences. The following are general guidelines for improved posture.  Your physician or physical therapist will provide you with any instructions specific to your needs. While reading these guidelines, remember:  The exercises prescribed by your provider will help you have the flexibility and strength to maintain correct postures.  The correct posture provides the optimal environment for your joints to work. All of your joints have less wear and tear when properly supported by a spine with good posture. This means  you will experience a healthier, less painful body.  Correct posture must be practiced with all of your activities, especially prolonged sitting and standing. Correct posture is as important when doing repetitive low-stress activities (typing) as it is when doing a single heavy-load activity (lifting). RESTING POSITIONS Consider which positions are most painful for you when choosing a resting position. If you have pain with flexion-based activities (sitting, bending, stooping, squatting), choose a position that allows you to rest in a less flexed posture. You would want to avoid curling into a fetal position on your side. If your pain worsens with extension-based activities (prolonged standing, working overhead), avoid resting in an extended position such as sleeping on your stomach. Most people will find more comfort when they rest with their spine in a more neutral position, neither too rounded nor too arched. Lying on a non-sagging bed on your side with a pillow between your knees, or on your back with a pillow under your knees will often provide some relief. Keep in mind, being in any one position for a prolonged period of time, no matter how correct your posture, can still lead to stiffness. PROPER SITTING POSTURE In order to minimize stress and discomfort on your spine, you must sit with correct posture Sitting with good posture should be effortless for a healthy body. Returning to good posture is a gradual process. Many people can work toward this most  comfortably by using various supports until they have the flexibility and strength to maintain this posture on their own. When sitting with proper posture, your ears will fall over your shoulders and your shoulders will fall over your hips. You should use the back of the chair to support your upper back. Your low back will be in a neutral position, just slightly arched. You may place a small pillow or folded towel at the base of your low back for support.  When working at a desk, create an environment that supports good, upright posture. Without extra support, muscles fatigue and lead to excessive strain on joints and other tissues. Keep these recommendations in mind: CHAIR:   A chair should be able to slide under your desk when your back makes contact with the back of the chair. This allows you to work closely.  The chair's height should allow your eyes to be level with the upper part of your monitor and your hands to be slightly lower than your elbows. BODY POSITION  Your feet should make contact with the floor. If this is not possible, use a foot rest.  Keep your ears over your shoulders. This will reduce stress on your neck and low back. INCORRECT SITTING POSTURES   If you are feeling tired and unable to assume a healthy sitting posture, do not slouch or slump. This puts excessive strain on your back tissues, causing more damage and pain. Healthier options include:  Using more support, like a lumbar pillow.  Switching tasks to something that requires you to be upright or walking.  Talking a brief walk.  Lying down to rest in a neutral-spine position. PROLONGED STANDING WHILE SLIGHTLY LEANING FORWARD  When completing a task that requires you to lean forward while standing in one place for a long time, place either foot up on a stationary 2-4 inch high object to help maintain the best posture. When both feet are on the ground, the low back tends to lose its slight inward curve. If this  curve flattens (or becomes too large), then the back  and your other joints will experience too much stress, fatigue more quickly and can cause pain.  CORRECT STANDING POSTURES Proper standing posture should be assumed with all daily activities, even if they only take a few moments, like when brushing your teeth. As in sitting, your ears should fall over your shoulders and your shoulders should fall over your hips. You should keep a slight tension in your abdominal muscles to brace your spine. Your tailbone should point down to the ground, not behind your body, resulting in an over-extended swayback posture.  INCORRECT STANDING POSTURES  Common incorrect standing postures include a forward head, locked knees and/or an excessive swayback. WALKING Walk with an upright posture. Your ears, shoulders and hips should all line-up. PROLONGED ACTIVITY IN A FLEXED POSITION When completing a task that requires you to bend forward at your waist or lean over a low surface, try to find a way to stabilize 3 of 4 of your limbs. You can place a hand or elbow on your thigh or rest a knee on the surface you are reaching across. This will provide you more stability so that your muscles do not fatigue as quickly. By keeping your knees relaxed, or slightly bent, you will also reduce stress across your low back. CORRECT LIFTING TECHNIQUES DO :   Assume a wide stance. This will provide you more stability and the opportunity to get as close as possible to the object which you are lifting.  Tense your abdominals to brace your spine; then bend at the knees and hips. Keeping your back locked in a neutral-spine position, lift using your leg muscles. Lift with your legs, keeping your back straight.  Test the weight of unknown objects before attempting to lift them.  Try to keep your elbows locked down at your sides in order get the best strength from your shoulders when carrying an object.  Always ask for help when lifting  heavy or awkward objects. INCORRECT LIFTING TECHNIQUES DO NOT:   Lock your knees when lifting, even if it is a small object.  Bend and twist. Pivot at your feet or move your feet when needing to change directions.  Assume that you cannot safely pick up a paperclip without proper posture.   This information is not intended to replace advice given to you by your health care provider. Make sure you discuss any questions you have with your health care provider.   Document Released: 07/26/2005 Document Revised: 12/10/2014 Document Reviewed: 11/07/2008 Elsevier Interactive Patient Education [employer].

## 2015-08-21 NOTE — Progress Notes (Signed)
Subjective:    Patient ID: Renee [name], female    DOB: 1971/04/20, 45 y.o.   MRN: [id number]  HPI 45 year old patient who presents with a chief complaint of right buttock pain present for the past 2 months.  She has been using Aleve without difficulty.  She also describes a several month history of numbness and tingling of both hands, worse at night but improves throughout the day. She does have a remote history of right breast cancer. Right buttock pain is aggravated by movement and alleviated by rest.  No nocturnal symptoms No bowel or bladder symptoms  Past Medical History  Diagnosis Date  . Breast cancer ([place])     2010 ; left side mastectomy chemo pill  . Thyroid disease   . Hypothyroidism   . DVT (deep venous thrombosis) ([facility])     left arm  . Depression   . Severe major depression ([place]) 08/15/2014  . PTSD (post-traumatic stress disorder) 08/15/2014  . Osteopenia 09/01/2014    Social History   Social History  . Marital Status: Divorced    Spouse Name: N/A  . Number of Children: N/A  . Years of Education: N/A   Occupational History  . Not on file.   Social History Main Topics  . Smoking status: Former Smoker -- 0.50 packs/day for 14 years    Quit date: 09/22/2010  . Smokeless tobacco: Never Used  . Alcohol Use: No  . Drug Use: No  . Sexual Activity: Not on file   Other Topics Concern  . Not on file   Social History Narrative    Past Surgical History  Procedure Laterality Date  . Mastectomy      left side  . Portacath placement    . Port-a-cath removal    . Breast reconstruction  01/2011    Family History  Problem Relation Age of Onset  . Hypertension Mother   . Diabetes Father   . Stroke Father   . Hypertension Father   . Hypertension Maternal Grandmother   . Diabetes Paternal Grandmother   . Cancer Paternal Grandfather     No Known Allergies  Current Outpatient Prescriptions on File Prior to Visit  Medication Sig Dispense Refill  .  amphetamine-dextroamphetamine (ADDERALL) 10 MG tablet Take 1 tablet (10 mg total) by mouth 2 (two) times daily. 60 tablet 0  . amphetamine-dextroamphetamine (ADDERALL) 10 MG tablet Take 1 tablet (10 mg total) by mouth 2 (two) times daily. 60 tablet 0  . amphetamine-dextroamphetamine (ADDERALL) 10 MG tablet Take 1 tablet (10 mg total) by mouth 2 (two) times daily. 60 tablet 0  . anastrozole (ARIMIDEX) 1 MG tablet TAKE ONE TABLET BY MOUTH  DAILY 30 tablet 5  . buPROPion (WELLBUTRIN XL) 150 MG 24 hr tablet Take 1 tablet (150 mg total) by mouth daily. 90 tablet 1  . calcium-vitamin D (OSCAL WITH D) 500-200 MG-UNIT per tablet Take 1 tablet by mouth 2 (two) times daily. 60 tablet 11  . levothyroxine (SYNTHROID, LEVOTHROID) 100 MCG tablet Take 1 tablet (100 mcg total) by mouth daily. 90 tablet 1  . LORazepam (ATIVAN) 0.5 MG tablet TAKE TWO TABLETS BY MOUTH EVERY 8 HOURS AS NEEDED FOR ANXIETY 60 tablet 5   No current facility-administered medications on file prior to visit.    BP 110/74 mmHg  Pulse 84  Temp(Src) 98.1 F (36.7 C) (Oral)  Resp 20  Ht 5' (1.524 m)  Wt 117 lb (53.071 kg)  BMI 22.85 kg/m2  SpO2 98%  Review of Systems  Constitutional: Negative.   HENT: Negative for congestion, dental problem, hearing loss, rhinorrhea, sinus pressure, sore throat and tinnitus.   Eyes: Negative for pain, discharge and visual disturbance.  Respiratory: Negative for cough and shortness of breath.   Cardiovascular: Negative for chest pain, palpitations and leg swelling.  Gastrointestinal: Negative for nausea, vomiting, abdominal pain, diarrhea, constipation, blood in stool and abdominal distention.  Genitourinary: Negative for dysuria, urgency, frequency, hematuria, flank pain, vaginal bleeding, vaginal discharge, difficulty urinating, vaginal pain and pelvic pain.  Musculoskeletal: Positive for back pain. Negative for joint swelling, arthralgias and gait problem.  Skin: Negative for rash.    Neurological: Negative for dizziness, syncope, speech difficulty, weakness, numbness and headaches.  Hematological: Negative for adenopathy.  Psychiatric/Behavioral: Negative for behavioral problems, dysphoric mood and agitation. The patient is not nervous/anxious.        Objective:   Physical Exam  Constitutional: She is oriented to person, place, and time. She appears well-developed and well-nourished.  Uncomfortable  HENT:  Head: Normocephalic.  Right Ear: External ear normal.  Left Ear: External ear normal.  Mouth/Throat: Oropharynx is clear and moist.  Eyes: Conjunctivae and EOM are normal. Pupils are equal, round, and reactive to light.  Neck: Normal range of motion. Neck supple. No thyromegaly present.  Cardiovascular: Normal rate, regular rhythm, normal heart sounds and intact distal pulses.   Pulmonary/Chest: Effort normal and breath sounds normal.  Abdominal: Soft. Bowel sounds are normal. She exhibits no mass. There is no tenderness.  Musculoskeletal: Normal range of motion.  And will to walk on heels and toes Unable to assume a supine position comfortably due to pain Flexion of the right hip and knee are alleviated the pain Unable to perform a straight leg test No motor weakness Right Achilles reflex blunted.  Left Achilles reflexes brisk  Lymphadenopathy:    She has no cervical adenopathy.  Neurological: She is alert and oriented to person, place, and time.  Skin: Skin is warm and dry. No rash noted.  Psychiatric: She has a normal mood and affect. Her behavior is normal.          Assessment & Plan:   Sciatica.  Will treat with Depo-Medrol analgesics and rest.  Will continue Aleve History breast cancer.  Oncology follow-up.  Encouraged  Consider lumbar MRI if there is not clinical improvement, especially in view of her breast cancer diagnosis

## 2015-09-04 ENCOUNTER — Encounter (HOSPITAL_COMMUNITY): Payer: BLUE CROSS/BLUE SHIELD

## 2015-09-04 ENCOUNTER — Encounter (HOSPITAL_COMMUNITY): Payer: BLUE CROSS/BLUE SHIELD | Attending: Hematology & Oncology | Admitting: Hematology & Oncology

## 2015-09-04 ENCOUNTER — Encounter (HOSPITAL_COMMUNITY): Payer: Self-pay | Admitting: Hematology & Oncology

## 2015-09-04 VITALS — BP 113/68 | HR 84 | Temp 98.1°F | Resp 18 | Wt 115.7 lb

## 2015-09-04 DIAGNOSIS — M8580 Other specified disorders of bone density and structure, unspecified site: Secondary | ICD-10-CM | POA: Diagnosis present

## 2015-09-04 DIAGNOSIS — C50912 Malignant neoplasm of unspecified site of left female breast: Secondary | ICD-10-CM | POA: Diagnosis not present

## 2015-09-04 DIAGNOSIS — M545 Low back pain, unspecified: Secondary | ICD-10-CM

## 2015-09-04 DIAGNOSIS — R634 Abnormal weight loss: Secondary | ICD-10-CM | POA: Diagnosis not present

## 2015-09-04 DIAGNOSIS — Z170 Estrogen receptor positive status [ER+]: Secondary | ICD-10-CM

## 2015-09-04 DIAGNOSIS — C50919 Malignant neoplasm of unspecified site of unspecified female breast: Secondary | ICD-10-CM

## 2015-09-04 DIAGNOSIS — D7589 Other specified diseases of blood and blood-forming organs: Secondary | ICD-10-CM | POA: Diagnosis not present

## 2015-09-04 LAB — CBC WITH DIFFERENTIAL/PLATELET
BASOS PCT: 0 %
Basophils Absolute: 0 10*3/uL (ref 0.0–0.1)
EOS PCT: 1 %
Eosinophils Absolute: 0.1 10*3/uL (ref 0.0–0.7)
HEMATOCRIT: 37.2 % (ref 36.0–46.0)
Hemoglobin: 12.9 g/dL (ref 12.0–15.0)
Lymphocytes Relative: 27 %
Lymphs Abs: 2.3 10*3/uL (ref 0.7–4.0)
MCH: 34.8 pg — ABNORMAL HIGH (ref 26.0–34.0)
MCHC: 34.7 g/dL (ref 30.0–36.0)
MCV: 100.3 fL — ABNORMAL HIGH (ref 78.0–100.0)
MONO ABS: 0.7 10*3/uL (ref 0.1–1.0)
MONOS PCT: 9 %
NEUTROS ABS: 5.4 10*3/uL (ref 1.7–7.7)
Neutrophils Relative %: 63 %
Platelets: 171 10*3/uL (ref 150–400)
RBC: 3.71 MIL/uL — ABNORMAL LOW (ref 3.87–5.11)
RDW: 13 % (ref 11.5–15.5)
WBC: 8.5 10*3/uL (ref 4.0–10.5)

## 2015-09-04 LAB — COMPREHENSIVE METABOLIC PANEL
ALBUMIN: 4 g/dL (ref 3.5–5.0)
ALT: 19 U/L (ref 14–54)
ANION GAP: 7 (ref 5–15)
AST: 24 U/L (ref 15–41)
Alkaline Phosphatase: 57 U/L (ref 38–126)
BILIRUBIN TOTAL: 0.5 mg/dL (ref 0.3–1.2)
BUN: 23 mg/dL — ABNORMAL HIGH (ref 6–20)
CO2: 30 mmol/L (ref 22–32)
Calcium: 9.1 mg/dL (ref 8.9–10.3)
Chloride: 102 mmol/L (ref 101–111)
Creatinine, Ser: 0.89 mg/dL (ref 0.44–1.00)
GFR calc Af Amer: >60 mL/min (ref >60)
GLUCOSE: 93 mg/dL (ref 65–99)
POTASSIUM: 3.5 mmol/L (ref 3.5–5.1)
Sodium: 139 mmol/L (ref 135–145)
TOTAL PROTEIN: 6.7 g/dL (ref 6.5–8.1)

## 2015-09-04 MED ORDER — DENOSUMAB 60 MG/ML ~~LOC~~ SOLN
60.0000 mg | Freq: Once | SUBCUTANEOUS | Status: AC
  Administered 2015-09-04: 60 mg via SUBCUTANEOUS
  Filled 2015-09-04: qty 1

## 2015-09-04 MED ORDER — TRAMADOL HCL 50 MG PO TABS: 50.0000 mg | ORAL_TABLET | Freq: Four times a day (QID) | ORAL | Status: DC | PRN

## 2015-09-04 NOTE — Progress Notes (Signed)
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 Both metastases greater than 3 mm but she also had extracapsular extension. No LV I was seen. ER +96%, PR +11%, Ki-67 20%, HER-2/neu nonamplified. Date of surgery was on 10/08/2009. She had dose dense epirubicin/Cytoxan for 4 cycles followed by weekly Taxol for 12 weeks followed by radiation therapy by Dr. Kyung Rudd finishing on 06/19/2010. She then started tamoxifen on 06/23/2010 and it was proposed that she will continue this for 10 years, but her hormone status demonstrates a post-menopausal state and therefore, she was switched to Arimidex on 04/03/2014.  She has had a L mastectomy with latissimus dorsi flap reconstruction and an R silicone implant, completed by Dr. Migdalia Dk.  She used to take Tamoxifen and is currently on Arimidex.  She denies swelling in her left arm or any problems with breasts right now.  Her great aunt also had breast cancer and she has had genetic testing done.    The patient has never had a colonoscopy.  Today she is very concerned about R sided abdominal and back pain, she notes that she has lost weight because it is affecting her appetite. She states that it is at times uncomfortable to sit for prolonged periods. She appears uncomfortable today.   Vitals - 1 value per visit 09/04/2015 08/21/2015 05/08/2015 03/18/2015 03/05/2015  Weight (lb) 115.7 117 116  115.2   Vitals - 1 value per visit 11/07/2014 09/04/2014 08/20/2014  Weight (lb) 123 133.6 132    Past Medical History    Diagnosis Date  . Breast cancer ([place])     2010 ; left side mastectomy chemo pill  . Thyroid disease   . Hypothyroidism   . DVT (deep venous thrombosis) ([facility])     left arm  . Depression   . Severe major depression ([place]) 08/15/2014  . PTSD (post-traumatic stress disorder) 08/15/2014  . Osteopenia 09/01/2014    has GRAVES DISEASE; Hypothyroidism; HYPERLIPIDEMIA; ANEMIA-IRON DEFICIENCY; Depression; TREMOR; NEPHROLITHIASIS, HX OF; 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; Thyroid disease; DVT of upper extremity (deep vein thrombosis) ([place]); Severe major depression ([place]); PTSD (post-traumatic stress disorder); Osteopenia; and ADD (attention deficit disorder) on her problem list.     has No Known Allergies.  We administered denosumab.  Past Surgical History  Procedure Laterality Date  . Mastectomy      left side  . Portacath placement    . Port-a-cath removal    . Breast reconstruction  01/2011    Denies any headaches, dizziness, double vision, fevers, chills, night sweats, nausea, vomiting, diarrhea, constipation, chest pain, heart palpitations, shortness of breath, blood in stool, black tarry stool, urinary pain, urinary burning, urinary frequency, hematuria. 14 point review of systems was performed and is negative except as detailed under history of present illness and above    PHYSICAL EXAMINATION  ECOG PERFORMANCE STATUS: 0 - Asymptomatic  Filed Vitals:   09/04/15 1405  BP: 113/68  Pulse: 84  Temp: 98.1 F (36.7 C)  Resp: 18    GENERAL:alert, pleasant, appears uncomfortable, somewhat tearful today SKIN: skin  color, texture, turgor are normal, no rashes or significant lesions HEAD: Normocephalic, No masses, lesions, tenderness or abnormalities EYES: normal, PERRLA, EOMI, Conjunctiva are pink and non-injected EARS: External ears normal OROPHARYNX:mucous membranes are moist  NECK: supple, trachea midline LYMPH:  No palpable LAD in the neck, supraclavicular or  axillary regions BREAST: Bilateral breast exam is performed with no palpable nodularity or masses. Left breast reconstruction is intact with no subcutaneous abnormalities noted. LUNGS: CTAB with no wheezing or rhonchi HEART: S1/S2 audible and regular, no ectopy, no murmurs ABDOMEN: Soft, nontender, nondistended, no palpable hepatosplenomegaly, no rebound or guarding. BACK: Back symmetric, no curvature.  EXTREMITIES:less then 2 second capillary refill, no skin discoloration, no cyanosis  NEURO: alert & oriented x 3 with fluent speech, no focal motor/sensory deficits, gait normal   LABORATORY DATA: CBC    Component Value Date/Time   WBC 8.5 09/04/2015 1302   RBC 3.71* 09/04/2015 1302   HGB 12.9 09/04/2015 1302   HCT 37.2 09/04/2015 1302   PLT 171 09/04/2015 1302   MCV 100.3* 09/04/2015 1302   MCH 34.8* 09/04/2015 1302   MCHC 34.7 09/04/2015 1302   RDW 13.0 09/04/2015 1302   LYMPHSABS 2.3 09/04/2015 1302   MONOABS 0.7 09/04/2015 1302   EOSABS 0.1 09/04/2015 1302   BASOSABS 0.0 09/04/2015 1302      Chemistry      Component Value Date/Time   NA 139 09/04/2015 1302   K 3.5 09/04/2015 1302   CL 102 09/04/2015 1302   CO2 30 09/04/2015 1302   BUN 23* 09/04/2015 1302   CREATININE 0.89 09/04/2015 1302      Component Value Date/Time   CALCIUM 9.1 09/04/2015 1302   ALKPHOS 57 09/04/2015 1302   AST 24 09/04/2015 1302   ALT 19 09/04/2015 1302   BILITOT 0.5 09/04/2015 1302       RADIOGRAPHIC STUDIES: CLINICAL DATA: Screening. Left mastectomy 2011.  EXAM: DIGITAL SCREENING UNILATERAL RIGHT MAMMOGRAM WITH IMPLANTS AND CAD<Procedure Description>DIGITAL SCREENING UNILATERAL RIGHT MAMMOGRAM WITH IMPLANTS AND CAD  The patient has a retropectoral implant. Standard and implant displaced views were performed.  COMPARISON: Previous exam(s).  ACR Breast Density Category b: There are scattered areas of fibroglandular density.  FINDINGS: The patient has had a left  mastectomy. There are no findings suspicious for malignancy. <CAD Text>  IMPRESSION: No mammographic evidence of malignancy. A result letter of this screening mammogram will be mailed directly to the patient.  RECOMMENDATION: Screening mammogram in one year. ([id number])  BI-RADS CATEGORY 1: Negative.   Electronically Signed  By: Nolon Nations M.D.  On: 04/23/2015 13:46   EXAM: DUAL X-RAY ABSORPTIOMETRY (DXA) FOR BONE MINERAL DENSITY  IMPRESSION: Ordering Physician: Baird Cancer PA-C,  Your patient KARISSA MEENAN completed a BMD test on 08/26/2014 using the Soham (software version: 14.10) manufactured by [employer]. The following summarizes the results of our evaluation. PATIENT BIOGRAPHICAL: Name: SKYLEIGH, WINDLE Patient ID: [id number] Birth Date: [birth year] Height: 60.0 in. Gender: Female Exam Date: 08/26/2014 Weight: 132.0 lbs. Indications: Caucasian, Hx Breast Ca, Low Calcium Intake, Post Menopausal, Secondary Osteoporosis Fractures: Treatments: Arimidex DENSITOMETRY RESULTS: Site Region Measured Date Measured Age WHO Classification Young Adult T-score BMD %Change vs. Previous Significant Change (*) AP Spine L1-L4 08/26/2014 44.9 Normal -0.5 1.121 g/cm2  DualFemur Neck Left 08/26/2014 44.9 Osteopenia -1.4 0.843 g/cm2 ASSESSMENT: BMD as determined from Femur Neck Left is 0.843 g/cm2 with a T-Score of -1.4. This patient is considered osteopenic according to [facility])  criteria.  World Pharmacologist (WHO) criteria for post-menopausal, Caucasian Women: Normal: T-score at or above -1 SD Osteopenia: T-score between -1 and -2.5 SD Osteoporosis: T-score at or below -2.5 SD  Your patient NALEE LIGHTLE Noffsinger completed a FRAX assessment on 08/26/2014 using the [place] (analysis version: 14.10) manufactured by [employer]. The following summarizes  the results of our evaluation.  PATIENT BIOGRAPHICAL: Name: BREAUNNA, GOTTLIEB Patient ID: [id number] Birth Date: November 27, 1970 Height: 60.0 in. Gender: Female [age] Weight: 132.0 lbs. Ethnicity: White Exam Date: 08/26/2014  FRAX* RESULTS: (version: 3.5) 10-year Probability of Fracture1 Major Osteoporotic Fracture2 Hip Fracture 3.0% 0.3% Population: [place] (Caucasian) Risk Factors: Secondary Osteoporosis  Based on Femur (Left) Neck BMD  1 -The 10-year probability of fracture may be lower than reported if the patient has received treatment. 2 -Major Osteoporotic Fracture: Clinical Spine, Forearm, Hip or Shoulder  *FRAX is a Materials engineer of the [employer] for Metabolic Bone Disease, a [place] (WHO) [facility].  ASSESSMENT: The probability of a major osteoporotic fracture is 3.0% within the next ten years.  The probability of a hip fracture is 0.3% within the next ten years.  RECOMMENDATIONS:  All treatment decisions require clinical judgement and consideration of the individual patient factors, including patient preferences, comorbidities, previous drug use, risk factors not captured in the FRAX model (e.g., frailty, falls, vitamin D deficiency, increased bone turnover, interval significant decline in bone density) and possible under- or over-estimation of fracture risk by FRAX.  In addition, the NOF Guide recommends that FDA -approved medical therapies be considered in postmenopausal women and men age >=50 years with a: *Hip or verterbral (clinical or morphometric) fracture *T-score of <+-2.5 at the spine or hip *Ten-year fracture probability by FRAX of >=3% for the hip fracture or >20% for major osteoportotic fracture.  FOLLOW-UP:  People with diagnosed cases of osteoporosis or osteopenia should be regularly tested for bone mineral  density. For patients eligible for Medicare, routine testing is allowed once every 2 years. Testing frequency can be increased for patients who have rapidly progressing disease, or for those who are receiving medical therapy to restore bone mass.  Based on these results, a follow-up exam is recommended in January 2018.  Sincerely,  Mark A. Thornton Papas, M.D.  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 She is to continue.  We discussed the duration of aromatase inhibitor therapy. She will complete 5 years of endocrine therapy this November. She had previously been on Tamoxifen which was started in 2011.  She is to take calcium and vitamin D. She will continue on her aromatase inhibitor.  In regards to her current pain and weight loss I have recommended starting with CT C/A/P.  We will make additional recommendations pending with results of these studies.  She is agreeable.  We will tentatively schedule her for a 6 month return.   Orders Placed This Encounter  Procedures  . CT Abdomen Pelvis W Contrast    Standing Status: Future     Number of Occurrences: 1     Standing Expiration Date: 09/03/2016    Order Specific Question:  If indicated for the ordered procedure, I authorize the administration of contrast media per Radiology protocol    Answer:  Yes  Order Specific Question:  Reason for Exam (SYMPTOM  OR DIAGNOSIS REQUIRED)    Answer:  history breast cancer, weight loss, significiant back pain limiting ADLs    Order Specific Question:  Is the patient pregnant?    Answer:  No    Order Specific Question:  Preferred imaging location?    Answer:  [facility]  . CT Chest W Contrast    Standing Status: Future     Number of Occurrences: 1     Standing  Expiration Date: 09/03/2016    Order Specific Question:  If indicated for the ordered procedure, I authorize the administration of contrast media per Radiology protocol    Answer:  Yes    Order Specific Question:  Reason for Exam (SYMPTOM  OR DIAGNOSIS REQUIRED)    Answer:  history breast cancer, weight loss, significiant back pain limiting ADLs    Order Specific Question:  Is the patient pregnant?    Answer:  No    Order Specific Question:  Preferred imaging location?    Answer:  [facility]  . Comprehensive metabolic panel    Standing Status: Standing     Number of Occurrences: 6     Standing Expiration Date: 09/03/2016  . CBC with Differential    Standing Status: Future     Number of Occurrences:      Standing Expiration Date: 09/03/2016  . SCHEDULING COMMUNICATION    Schedule 15 minute injection appointment.  Cannot select a 6 month frequency so will want to set up another visit in 6 months.     THERAPY PLAN:  NCCN guidelines recommends the following surveillance for invasive breast cancer:  A. History and Physical exam every 4-6 months for 5 years and then every 12 months.  B. Mammography every 12 months  C. Women on Tamoxifen: annual gynecologic assessment every 12 months if uterus is present.  D. Women on aromatase inhibitor or who experience ovarian failure secondary to treatment should have monitoring of bone health with a bone mineral density determination at baseline and periodically thereafter.  E. Assess and encourage adherence to adjuvant endocrine therapy.  F. Evidence suggests that active lifestyle and achieving and maintaining an ideal body weight (20-25 BMI) may lead to optimal breast cancer outcomes.   All questions were answered. The patient knows to call the clinic with any problems, questions or concerns. We can certainly see the patient much sooner if necessary.   tis note was electronically signed.  Kelby Fam. Whitney Muse, MD

## 2015-09-04 NOTE — Patient Instructions (Signed)
[place] at [facility] Discharge Instructions  RECOMMENDATIONS MADE BY THE CONSULTANT AND ANY TEST RESULTS WILL BE SENT TO YOUR REFERRING PHYSICIAN.     Exam and discussion by Dr Whitney Muse today We will get CT scans on you, based on these images we will decide how to precede from there. We will call you with the results, and go from there.  We will put you down for 6 months to follow up. Prolia every 6 months, with labs prior Please call the clinic if you have any questions or concerns       Thank you for choosing [place] at [facility] to provide your oncology and hematology care.  To afford each patient quality time with our provider, please arrive at least 15 minutes before your scheduled appointment time.   Beginning January 23rd 2017 lab work for the [facility] will be done in the  Main lab at [facility] on 1st floor. If you have a lab appointment with the [facility] please come in thru the  Main Entrance and check in at the main information desk  You need to re-schedule your appointment should you arrive 10 or more minutes late.  We strive to give you quality time with our providers, and arriving late affects you and other patients whose appointments are after yours.  Also, if you no show three or more times for appointments you may be dismissed from the clinic at the providers discretion.     Again, thank you for choosing [facility].  Our hope is that these requests will decrease the amount of time that you wait before being seen by our physicians.       _____________________________________________________________  Should you have questions after your visit to [facility], please contact our office at [phone] between the hours of 8:30 a.m. and 4:30 p.m.  Voicemails left after 4:30 p.m. will not be returned until the following business day.  For prescription refill requests, have your  pharmacy contact our office.

## 2015-09-04 NOTE — Progress Notes (Signed)
Renee [name]'s reason for visit today is for an injection and labs as scheduled per MD orders.  Labs were drawn prior to administration of ordered medication.    [name] also received prolia  per MD orders; see [facility] for administration details.  [name] tolerated all procedures well and without incident; questions were answered and patient was discharged.

## 2015-09-08 ENCOUNTER — Telehealth (HOSPITAL_COMMUNITY): Payer: Self-pay | Admitting: Hematology & Oncology

## 2015-09-08 NOTE — Telephone Encounter (Signed)
Pt was given a prolia inj on 09/04/15. I did not receive a notification for the inj. AUTH WAS NOT OBTAINED  Per [employer] the will NOT retro auth.  [facility] [phone]

## 2015-09-09 ENCOUNTER — Ambulatory Visit (HOSPITAL_COMMUNITY)
Admission: RE | Admit: 2015-09-09 | Discharge: 2015-09-09 | Disposition: A | Discharge status: Home / Self Care | Payer: BLUE CROSS/BLUE SHIELD | Source: Ambulatory Visit | Attending: Hematology & Oncology | Admitting: Hematology & Oncology

## 2015-09-09 DIAGNOSIS — I2510 Atherosclerotic heart disease of native coronary artery without angina pectoris: Secondary | ICD-10-CM | POA: Insufficient documentation

## 2015-09-09 DIAGNOSIS — Z9221 Personal history of antineoplastic chemotherapy: Secondary | ICD-10-CM | POA: Insufficient documentation

## 2015-09-09 DIAGNOSIS — M545 Low back pain, unspecified: Secondary | ICD-10-CM

## 2015-09-09 DIAGNOSIS — N200 Calculus of kidney: Secondary | ICD-10-CM | POA: Diagnosis not present

## 2015-09-09 DIAGNOSIS — Z853 Personal history of malignant neoplasm of breast: Secondary | ICD-10-CM | POA: Insufficient documentation

## 2015-09-09 DIAGNOSIS — R634 Abnormal weight loss: Secondary | ICD-10-CM | POA: Insufficient documentation

## 2015-09-09 DIAGNOSIS — K5900 Constipation, unspecified: Secondary | ICD-10-CM | POA: Diagnosis not present

## 2015-09-09 DIAGNOSIS — I358 Other nonrheumatic aortic valve disorders: Secondary | ICD-10-CM | POA: Insufficient documentation

## 2015-09-09 DIAGNOSIS — C50912 Malignant neoplasm of unspecified site of left female breast: Secondary | ICD-10-CM

## 2015-09-09 MED ORDER — IOHEXOL 300 MG/ML  SOLN
100.0000 mL | Freq: Once | INTRAMUSCULAR | Status: AC | PRN
  Administered 2015-09-09: 100 mL via INTRAVENOUS

## 2015-09-11 ENCOUNTER — Encounter (HOSPITAL_COMMUNITY): Payer: Self-pay | Admitting: Oncology

## 2015-09-15 ENCOUNTER — Other Ambulatory Visit (HOSPITAL_COMMUNITY): Payer: Self-pay | Admitting: Emergency Medicine

## 2015-09-15 MED ORDER — HYDROCODONE-ACETAMINOPHEN 5-325 MG PO TABS: 1.0000 | ORAL_TABLET | Freq: Four times a day (QID) | ORAL | Status: DC | PRN

## 2015-09-27 DIAGNOSIS — D7589 Other specified diseases of blood and blood-forming organs: Secondary | ICD-10-CM | POA: Insufficient documentation

## 2015-09-29 ENCOUNTER — Other Ambulatory Visit (HOSPITAL_COMMUNITY): Payer: Self-pay | Admitting: Hematology & Oncology

## 2015-10-02 ENCOUNTER — Other Ambulatory Visit (HOSPITAL_COMMUNITY): Payer: Self-pay | Admitting: Oncology

## 2015-10-02 ENCOUNTER — Other Ambulatory Visit (HOSPITAL_COMMUNITY): Payer: Self-pay | Admitting: Emergency Medicine

## 2015-10-02 DIAGNOSIS — C50912 Malignant neoplasm of unspecified site of left female breast: Secondary | ICD-10-CM

## 2015-10-02 DIAGNOSIS — M545 Low back pain, unspecified: Secondary | ICD-10-CM

## 2015-10-02 MED ORDER — HYDROCODONE-ACETAMINOPHEN 5-325 MG PO TABS: 1.0000 | ORAL_TABLET | Freq: Four times a day (QID) | ORAL | Status: DC | PRN

## 2015-10-14 ENCOUNTER — Ambulatory Visit (HOSPITAL_COMMUNITY): Payer: BLUE CROSS/BLUE SHIELD

## 2015-10-14 ENCOUNTER — Ambulatory Visit (HOSPITAL_COMMUNITY): Admission: RE | Admit: 2015-10-14 | Payer: BLUE CROSS/BLUE SHIELD | Source: Ambulatory Visit

## 2015-11-07 ENCOUNTER — Encounter (HOSPITAL_COMMUNITY): Payer: Self-pay

## 2015-11-28 ENCOUNTER — Telehealth: Payer: Self-pay | Admitting: Internal Medicine

## 2015-11-28 MED ORDER — AMPHETAMINE-DEXTROAMPHETAMINE 10 MG PO TABS: 10.0000 mg | ORAL_TABLET | Freq: Two times a day (BID) | ORAL | Status: DC

## 2015-11-28 NOTE — Telephone Encounter (Signed)
Left detailed message on personal voicemail Rx's ready for pickup, will be at the front desk. Also you need to schedule a physical you are overdue. Please call office and schedule. Rx's printed and signed.

## 2015-11-28 NOTE — Telephone Encounter (Signed)
Pt request refill amphetamine-dextroamphetamine (ADDERALL) 10 MG tablet °3 mo supply °

## 2015-12-01 ENCOUNTER — Other Ambulatory Visit: Payer: Self-pay | Admitting: Internal Medicine

## 2015-12-15 ENCOUNTER — Ambulatory Visit (INDEPENDENT_AMBULATORY_CARE_PROVIDER_SITE_OTHER): Payer: BLUE CROSS/BLUE SHIELD | Admitting: Internal Medicine

## 2015-12-15 ENCOUNTER — Encounter: Payer: Self-pay | Admitting: Internal Medicine

## 2015-12-15 VITALS — BP 114/66 | HR 96 | Temp 98.0°F | Resp 18 | Ht 60.0 in | Wt 112.0 lb

## 2015-12-15 DIAGNOSIS — M25552 Pain in left hip: Secondary | ICD-10-CM | POA: Diagnosis not present

## 2015-12-15 DIAGNOSIS — M5432 Sciatica, left side: Secondary | ICD-10-CM

## 2015-12-15 DIAGNOSIS — C50912 Malignant neoplasm of unspecified site of left female breast: Secondary | ICD-10-CM | POA: Diagnosis not present

## 2015-12-15 MED ORDER — METHYLPREDNISOLONE ACETATE 80 MG/ML IJ SUSP
80.0000 mg | Freq: Once | INTRAMUSCULAR | Status: AC
  Administered 2015-12-15: 80 mg via INTRAMUSCULAR

## 2015-12-15 MED ORDER — HYDROCODONE-ACETAMINOPHEN 5-325 MG PO TABS: 1.0000 | ORAL_TABLET | Freq: Four times a day (QID) | ORAL | Status: DC | PRN

## 2015-12-15 NOTE — Progress Notes (Signed)
Subjective:    Patient ID: Renee [name], female    DOB: 30-Nov-1970, 45 y.o.   MRN: [id number]  HPI  45 year old patient who is followed closely by oncology.  Due to left breast cancer.  She presents with a four-day history of left buttock and left leg pain.  She describes the pain is quite uncomfortable and constant.  Pain is maximal in the left buttock area but radiates down the left posterior leg into the posterior calf.  Denies motor weakness but has a difficult time walking due to the discomfort She was seen by oncology in January of this year with right sided sciatica.  Thoracic and lumbar MRI were ordered but not performed  Wt Readings from Last 3 Encounters:  12/15/15 112 lb (50.803 kg)  09/04/15 115 lb 11.2 oz (52.481 kg)  08/21/15 117 lb (53.071 kg)    Past Medical History  Diagnosis Date  . Breast cancer ([place])     2010 ; left side mastectomy chemo pill  . Thyroid disease   . Hypothyroidism   . DVT (deep venous thrombosis) ([facility])     left arm  . Depression   . Severe major depression ([place]) 08/15/2014  . PTSD (post-traumatic stress disorder) 08/15/2014  . Osteopenia 09/01/2014     Social History   Social History  . Marital Status: Divorced    Spouse Name: N/A  . Number of Children: N/A  . Years of Education: N/A   Occupational History  . Not on file.   Social History Main Topics  . Smoking status: Former Smoker -- 0.50 packs/day for 14 years    Quit date: 09/22/2010  . Smokeless tobacco: Never Used  . Alcohol Use: No  . Drug Use: No  . Sexual Activity: Not on file   Other Topics Concern  . Not on file   Social History Narrative    Past Surgical History  Procedure Laterality Date  . Mastectomy      left side  . Portacath placement    . Port-a-cath removal    . Breast reconstruction  01/2011    Family History  Problem Relation Age of Onset  . Hypertension Mother   . Diabetes Father   . Stroke Father   . Hypertension Father   . Hypertension  Maternal Grandmother   . Diabetes Paternal Grandmother   . Cancer Paternal Grandfather     No Known Allergies  Current Outpatient Prescriptions on File Prior to Visit  Medication Sig Dispense Refill  . amphetamine-dextroamphetamine (ADDERALL) 10 MG tablet Take 1 tablet (10 mg total) by mouth 2 (two) times daily. 60 tablet 0  . anastrozole (ARIMIDEX) 1 MG tablet TAKE 1 TABLET EVERY DAY 30 tablet 5  . buPROPion (WELLBUTRIN XL) 150 MG 24 hr tablet Take 1 tablet (150 mg total) by mouth daily. 90 tablet 1  . calcium-vitamin D (OSCAL WITH D) 500-200 MG-UNIT per tablet Take 1 tablet by mouth 2 (two) times daily. 60 tablet 11  . levothyroxine (SYNTHROID, LEVOTHROID) 100 MCG tablet Take 1 tablet (100 mcg total) by mouth daily. 90 tablet 1  . LORazepam (ATIVAN) 0.5 MG tablet TAKE TWO TABLETS BY MOUTH EVERY 8 HOURS AS NEEDED FOR ANXIETY 60 tablet 5  . naproxen sodium (ALEVE) 220 MG tablet Take 220 mg by mouth 2 (two) times daily with a meal.    . amphetamine-dextroamphetamine (ADDERALL) 10 MG tablet Take 1 tablet (10 mg total) by mouth 2 (two) times daily. (Patient not taking: Reported on 12/15/2015)  60 tablet 0  . amphetamine-dextroamphetamine (ADDERALL) 10 MG tablet Take 1 tablet (10 mg total) by mouth 2 (two) times daily. (Patient not taking: Reported on 12/15/2015) 60 tablet 0  . HYDROcodone-acetaminophen (NORCO/VICODIN) 5-325 MG tablet Take 1 tablet by mouth every 6 (six) hours as needed for moderate pain. (Patient not taking: Reported on 12/15/2015) 30 tablet 0  . traMADol (ULTRAM) 50 MG tablet Take 1 tablet (50 mg total) by mouth every 6 (six) hours as needed. (Patient not taking: Reported on 12/15/2015) 60 tablet 0   No current facility-administered medications on file prior to visit.    BP 114/66 mmHg  Pulse 96  Temp(Src) 98 F (36.7 C) (Oral)  Resp 18  Ht 5' (1.524 m)  Wt 112 lb (50.803 kg)  BMI 21.87 kg/m2  SpO2 99%     Review of Systems  Constitutional: Negative.   HENT: Negative for  congestion, dental problem, hearing loss, rhinorrhea, sinus pressure, sore throat and tinnitus.   Eyes: Negative for pain, discharge and visual disturbance.  Respiratory: Negative for cough and shortness of breath.   Cardiovascular: Negative for chest pain, palpitations and leg swelling.  Gastrointestinal: Negative for nausea, vomiting, abdominal pain, diarrhea, constipation, blood in stool and abdominal distention.  Genitourinary: Negative for dysuria, urgency, frequency, hematuria, flank pain, vaginal bleeding, vaginal discharge, difficulty urinating, vaginal pain and pelvic pain.  Musculoskeletal: Negative for joint swelling, arthralgias and gait problem.       Left buttock and leg pain  Skin: Negative for rash.  Neurological: Negative for dizziness, syncope, speech difficulty, weakness, numbness and headaches.  Hematological: Negative for adenopathy.  Psychiatric/Behavioral: Negative for behavioral problems, dysphoric mood and agitation. The patient is not nervous/anxious.        Objective:   Physical Exam  Constitutional: She appears well-developed and well-nourished. No distress.  Uncomfortable.  Walks with a slight limp due to the pain   Musculoskeletal:  No pain with left hip flexion Flexion of the left hip alleviates the pain Straight leg test on the left did tend to aggravate the buttock pain  Reflexes were blunted but equal Patient was able to walk on toes and heels with difficulty due to the discomfort          Assessment & Plan:   Left-sided sciatica in a patient with history of breast cancer.  Patient has imaging studies are ordered for the thoracic and lumbar spine.  She was told to call the radiology department to follow through on these imaging studies tomorrow.  She is agreeable.  Patient was treated with Depo-Medrol and given a prescription for analgesic.  She reported any new or worsening symptoms History of breast cancer

## 2015-12-15 NOTE — Progress Notes (Signed)
Pre visit review using our clinic review tool, if applicable. No additional management support is needed unless otherwise documented below in the visit note. 

## 2015-12-15 NOTE — Patient Instructions (Addendum)
Call or return to clinic prn if these symptoms worsen or fail to improve as anticipated.  Most patients with low back pain will improve with time over the next two to 6 weeks.  Keep active but avoid any activities that cause pain.  Apply moist heat to the low back area several times daily.  Please have MRI of the thoracic and lumbar spine performed as ordered  Call if any clinical worsening  Sciatica Sciatica is pain, weakness, numbness, or tingling along the path of the sciatic nerve. The nerve starts in the lower back and runs down the back of each leg. The nerve controls the muscles in the lower leg and in the back of the knee, while also providing sensation to the back of the thigh, lower leg, and the sole of your foot. Sciatica is a symptom of another medical condition. For instance, nerve damage or certain conditions, such as a herniated disk or bone spur on the spine, pinch or put pressure on the sciatic nerve. This causes the pain, weakness, or other sensations normally associated with sciatica. Generally, sciatica only affects one side of the body. CAUSES   Herniated or slipped disc.  Degenerative disk disease.  A pain disorder involving the narrow muscle in the buttocks (piriformis syndrome).  Pelvic injury or fracture.  Pregnancy.  Tumor (rare). SYMPTOMS  Symptoms can vary from mild to very severe. The symptoms usually travel from the low back to the buttocks and down the back of the leg. Symptoms can include:  Mild tingling or dull aches in the lower back, leg, or hip.  Numbness in the back of the calf or sole of the foot.  Burning sensations in the lower back, leg, or hip.  Sharp pains in the lower back, leg, or hip.  Leg weakness.  Severe back pain inhibiting movement. These symptoms may get worse with coughing, sneezing, laughing, or prolonged sitting or standing. Also, being overweight may worsen symptoms. DIAGNOSIS  Your caregiver will perform a physical exam  to look for common symptoms of sciatica. He or she may ask you to do certain movements or activities that would trigger sciatic nerve pain. Other tests may be performed to find the cause of the sciatica. These may include:  Blood tests.  X-rays.  Imaging tests, such as an MRI or CT scan. TREATMENT  Treatment is directed at the cause of the sciatic pain. Sometimes, treatment is not necessary and the pain and discomfort goes away on its own. If treatment is needed, your caregiver may suggest:  Over-the-counter medicines to relieve pain.  Prescription medicines, such as anti-inflammatory medicine, muscle relaxants, or narcotics.  Applying heat or ice to the painful area.  Steroid injections to lessen pain, irritation, and inflammation around the nerve.  Reducing activity during periods of pain.  Exercising and stretching to strengthen your abdomen and improve flexibility of your spine. Your caregiver may suggest losing weight if the extra weight makes the back pain worse.  Physical therapy.  Surgery to eliminate what is pressing or pinching the nerve, such as a bone spur or part of a herniated disk. HOME CARE INSTRUCTIONS   Only take over-the-counter or prescription medicines for pain or discomfort as directed by your caregiver.  Apply ice to the affected area for 20 minutes, 3-4 times a day for the first 48-72 hours. Then try heat in the same way.  Exercise, stretch, or perform your usual activities if these do not aggravate your pain.  Attend physical therapy sessions  as directed by your caregiver.  Keep all follow-up appointments as directed by your caregiver.  Do not wear high heels or shoes that do not provide proper support.  Check your mattress to see if it is too soft. A firm mattress may lessen your pain and discomfort. SEEK IMMEDIATE MEDICAL CARE IF:   You lose control of your bowel or bladder (incontinence).  You have increasing weakness in the lower back, pelvis,  buttocks, or legs.  You have redness or swelling of your back.  You have a burning sensation when you urinate.  You have pain that gets worse when you lie down or awakens you at night.  Your pain is worse than you have experienced in the past.  Your pain is lasting longer than 4 weeks.  You are suddenly losing weight without reason. MAKE SURE YOU:  Understand these instructions.  Will watch your condition.  Will get help right away if you are not doing well or get worse.   This information is not intended to replace advice given to you by your health care provider. Make sure you discuss any questions you have with your health care provider.   Document Released: 07/20/2001 Document Revised: 04/16/2015 Document Reviewed: 12/05/2011 Elsevier Interactive Patient Education [employer].

## 2015-12-19 ENCOUNTER — Telehealth: Payer: Self-pay | Admitting: Internal Medicine

## 2015-12-19 NOTE — Telephone Encounter (Signed)
Left detailed message on personal voicemail, Dr. Raliegh Ip said okay to take Hydrocodone and Ibuprofen simultaneously meaning together. If pain is not better by Monday I would suggest you make an appt to be seen. Any questions please call office.

## 2015-12-19 NOTE — Telephone Encounter (Signed)
Left message on voicemail to call office.  

## 2015-12-19 NOTE — Telephone Encounter (Signed)
Yes.  She can use both medications simultaneously

## 2015-12-19 NOTE — Telephone Encounter (Signed)
Please see message and advise 

## 2015-12-19 NOTE — Telephone Encounter (Signed)
Pt pain is worse.  Pt want to know if she could rotate ibuprofen with her hydrocodone.

## 2015-12-23 ENCOUNTER — Encounter (HOSPITAL_COMMUNITY): Payer: Self-pay | Admitting: *Deleted

## 2015-12-23 ENCOUNTER — Ambulatory Visit (HOSPITAL_COMMUNITY)
Admission: RE | Admit: 2015-12-23 | Discharge: 2015-12-23 | Disposition: A | Discharge status: Home / Self Care | Payer: BLUE CROSS/BLUE SHIELD | Source: Ambulatory Visit | Attending: Oncology | Admitting: Oncology

## 2015-12-23 DIAGNOSIS — M5124 Other intervertebral disc displacement, thoracic region: Secondary | ICD-10-CM | POA: Insufficient documentation

## 2015-12-23 DIAGNOSIS — M5126 Other intervertebral disc displacement, lumbar region: Secondary | ICD-10-CM | POA: Insufficient documentation

## 2015-12-23 DIAGNOSIS — M5136 Other intervertebral disc degeneration, lumbar region: Secondary | ICD-10-CM | POA: Insufficient documentation

## 2015-12-23 DIAGNOSIS — M545 Low back pain, unspecified: Secondary | ICD-10-CM

## 2015-12-23 DIAGNOSIS — M5127 Other intervertebral disc displacement, lumbosacral region: Secondary | ICD-10-CM | POA: Diagnosis not present

## 2015-12-23 MED ORDER — GADOBENATE DIMEGLUMINE 529 MG/ML IV SOLN
10.0000 mL | Freq: Once | INTRAVENOUS | Status: AC | PRN
  Administered 2015-12-23: 10 mL via INTRAVENOUS

## 2016-01-12 ENCOUNTER — Ambulatory Visit (INDEPENDENT_AMBULATORY_CARE_PROVIDER_SITE_OTHER): Payer: BLUE CROSS/BLUE SHIELD | Admitting: Family Medicine

## 2016-01-12 ENCOUNTER — Encounter: Payer: Self-pay | Admitting: Family Medicine

## 2016-01-12 VITALS — BP 102/72 | HR 88 | Temp 98.1°F | Ht 60.0 in | Wt 112.6 lb

## 2016-01-12 DIAGNOSIS — J069 Acute upper respiratory infection, unspecified: Secondary | ICD-10-CM | POA: Diagnosis not present

## 2016-01-12 DIAGNOSIS — R05 Cough: Secondary | ICD-10-CM | POA: Diagnosis not present

## 2016-01-12 DIAGNOSIS — R059 Cough, unspecified: Secondary | ICD-10-CM

## 2016-01-12 NOTE — Progress Notes (Signed)
Pre visit review using our clinic review tool, if applicable. No additional management support is needed unless otherwise documented below in the visit note. 

## 2016-01-12 NOTE — Patient Instructions (Addendum)
Please consider Allegra, Claritin, or Zyrtec for symptoms and flonase as needed. Mucinex DM for cough as needed. Please follow up for further evaluation if symptoms do not improve in 3-4 days, worsen, or you develop a fever >101.  Upper Respiratory Infection, Adult Most upper respiratory infections (URIs) are a viral infection of the air passages leading to the lungs. A URI affects the nose, throat, and upper air passages. The most common type of URI is nasopharyngitis and is typically referred to as "the common cold." URIs run their course and usually go away on their own. Most of the time, a URI does not require medical attention, but sometimes a bacterial infection in the upper airways can follow a viral infection. This is called a secondary infection. Sinus and middle ear infections are common types of secondary upper respiratory infections. Bacterial pneumonia can also complicate a URI. A URI can worsen asthma and chronic obstructive pulmonary disease (COPD). Sometimes, these complications can require emergency medical care and may be life threatening.  CAUSES Almost all URIs are caused by viruses. A virus is a type of germ and can spread from one person to another.  RISKS FACTORS You may be at risk for a URI if:   You smoke.   You have chronic heart or lung disease.  You have a weakened defense (immune) system.   You are very young or very old.   You have nasal allergies or asthma.  You work in crowded or poorly ventilated areas.  You work in health care facilities or schools. SIGNS AND SYMPTOMS  Symptoms typically develop 2-3 days after you come in contact with a cold virus. Most viral URIs last 7-10 days. However, viral URIs from the influenza virus (flu virus) can last 14-18 days and are typically more severe. Symptoms may include:   Runny or stuffy (congested) nose.   Sneezing.   Cough.   Sore throat.   Headache.   Fatigue.   Fever.   Loss of appetite.    Pain in your forehead, behind your eyes, and over your cheekbones (sinus pain).  Muscle aches.  DIAGNOSIS  Your health care provider may diagnose a URI by:  Physical exam.  Tests to check that your symptoms are not due to another condition such as:  Strep throat.  Sinusitis.  Pneumonia.  Asthma. TREATMENT  A URI goes away on its own with time. It cannot be cured with medicines, but medicines may be prescribed or recommended to relieve symptoms. Medicines may help:  Reduce your fever.  Reduce your cough.  Relieve nasal congestion. HOME CARE INSTRUCTIONS   Take medicines only as directed by your health care provider.   Gargle warm saltwater or take cough drops to comfort your throat as directed by your health care provider.  Use a warm mist humidifier or inhale steam from a shower to increase air moisture. This may make it easier to breathe.  Drink enough fluid to keep your urine clear or pale yellow.   Eat soups and other clear broths and maintain good nutrition.   Rest as needed.   Return to work when your temperature has returned to normal or as your health care provider advises. You may need to stay home longer to avoid infecting others. You can also use a face mask and careful hand washing to prevent spread of the virus.  Increase the usage of your inhaler if you have asthma.   Do not use any tobacco products, including cigarettes, chewing tobacco, or  electronic cigarettes. If you need help quitting, ask your health care provider. PREVENTION  The best way to protect yourself from getting a cold is to practice good hygiene.   Avoid oral or hand contact with people with cold symptoms.   Wash your hands often if contact occurs.  There is no clear evidence that vitamin C, vitamin E, echinacea, or exercise reduces the chance of developing a cold. However, it is always recommended to get plenty of rest, exercise, and practice good nutrition.  SEEK MEDICAL  CARE IF:   You are getting worse rather than better.   Your symptoms are not controlled by medicine.   You have chills.  You have worsening shortness of breath.  You have brown or red mucus.  You have yellow or brown nasal discharge.  You have pain in your face, especially when you bend forward.  You have a fever.  You have swollen neck glands.  You have pain while swallowing.  You have white areas in the back of your throat. SEEK IMMEDIATE MEDICAL CARE IF:   You have severe or persistent:  Headache.  Ear pain.  Sinus pain.  Chest pain.  You have chronic lung disease and any of the following:  Wheezing.  Prolonged cough.  Coughing up blood.  A change in your usual mucus.  You have a stiff neck.  You have changes in your:  Vision.  Hearing.  Thinking.  Mood. MAKE SURE YOU:   Understand these instructions.  Will watch your condition.  Will get help right away if you are not doing well or get worse.   This information is not intended to replace advice given to you by your health care provider. Make sure you discuss any questions you have with your health care provider.   Document Released: 01/19/2001 Document Revised: 12/10/2014 Document Reviewed: 10/31/2013 Elsevier Interactive Patient Education [employer].

## 2016-01-12 NOTE — Progress Notes (Signed)
Subjective:    Patient ID: Renee [name], female    DOB: 12-01-1970, 45 y.o.   MRN: [id number]  HPI  Renee [name] is a 45 year old female who presents today with maxillary sinus pressure/pain. Associated symptoms of  rhinitis, congestion, mild tooth discomfort that is not noted as pain, chills, sweats, productive cough with yellow/green sputum, post nasal drip, and itchy/watery eyes. Symptoms have been present for 5 days.  Denies N/V/D, recent sick contacts or recent antibiotics Treatment with ibuprofen has provided limited benefit. No history of asthma/bronchitis, she is a nonsmoker  Review of Systems  Constitutional: Positive for chills. Negative for fever.  HENT: Positive for congestion, postnasal drip, rhinorrhea, sinus pressure and sneezing.   Eyes: Positive for itching.       Watery eyes  Respiratory: Positive for cough. Negative for shortness of breath and wheezing.   Cardiovascular: Negative for chest pain and palpitations.  Gastrointestinal: Negative for nausea, vomiting, abdominal pain and diarrhea.  Genitourinary: Negative for dysuria, urgency, frequency and flank pain.  Musculoskeletal: Negative for joint swelling and arthralgias.  Skin: Negative for rash.  Neurological: Negative for dizziness, light-headedness and headaches.   Past Medical History  Diagnosis Date  . Breast cancer ([place])     2010 ; left side mastectomy chemo pill  . Thyroid disease   . Hypothyroidism   . DVT (deep venous thrombosis) ([facility])     left arm  . Depression   . Severe major depression ([place]) 08/15/2014  . PTSD (post-traumatic stress disorder) 08/15/2014  . Osteopenia 09/01/2014     Social History   Social History  . Marital Status: Divorced    Spouse Name: N/A  . Number of Children: N/A  . Years of Education: N/A   Occupational History  . Not on file.   Social History Main Topics  . Smoking status: Former Smoker -- 0.50 packs/day for 14 years    Quit date: 09/22/2010  . Smokeless  tobacco: Never Used  . Alcohol Use: No  . Drug Use: No  . Sexual Activity: Not on file   Other Topics Concern  . Not on file   Social History Narrative    Past Surgical History  Procedure Laterality Date  . Mastectomy      left side  . Portacath placement    . Port-a-cath removal    . Breast reconstruction  01/2011    Family History  Problem Relation Age of Onset  . Hypertension Mother   . Diabetes Father   . Stroke Father   . Hypertension Father   . Hypertension Maternal Grandmother   . Diabetes Paternal Grandmother   . Cancer Paternal Grandfather     No Known Allergies  Current Outpatient Prescriptions on File Prior to Visit  Medication Sig Dispense Refill  . amphetamine-dextroamphetamine (ADDERALL) 10 MG tablet Take 1 tablet (10 mg total) by mouth 2 (two) times daily. 60 tablet 0  . anastrozole (ARIMIDEX) 1 MG tablet TAKE 1 TABLET EVERY DAY 30 tablet 5  . buPROPion (WELLBUTRIN XL) 150 MG 24 hr tablet Take 1 tablet (150 mg total) by mouth daily. 90 tablet 1  . calcium-vitamin D (OSCAL WITH D) 500-200 MG-UNIT per tablet Take 1 tablet by mouth 2 (two) times daily. 60 tablet 11  . levothyroxine (SYNTHROID, LEVOTHROID) 100 MCG tablet Take 1 tablet (100 mcg total) by mouth daily. 90 tablet 1  . LORazepam (ATIVAN) 0.5 MG tablet TAKE TWO TABLETS BY MOUTH EVERY 8 HOURS AS NEEDED FOR ANXIETY  60 tablet 5  . naproxen sodium (ALEVE) 220 MG tablet Take 220 mg by mouth 2 (two) times daily with a meal.     No current facility-administered medications on file prior to visit.    BP 102/72 mmHg  Pulse 88  Temp(Src) 98.1 F (36.7 C) (Oral)  Ht 5' (1.524 m)  Wt 112 lb 9.6 oz (51.075 kg)  BMI 21.99 kg/m2  SpO2 98%        Objective:   Physical Exam  Constitutional: She is oriented to person, place, and time. She appears well-developed and well-nourished.  HENT:  Right Ear: Tympanic membrane normal.  Left Ear: Tympanic membrane normal.  Nose: Rhinorrhea present. Right sinus  exhibits maxillary sinus tenderness. Right sinus exhibits no frontal sinus tenderness. Left sinus exhibits maxillary sinus tenderness. Left sinus exhibits no frontal sinus tenderness.  Mouth/Throat: Mucous membranes are normal. No oropharyngeal exudate or posterior oropharyngeal edema.  Eyes: Pupils are equal, round, and reactive to light. No scleral icterus.  Neck: Neck supple.  Cardiovascular: Normal rate and regular rhythm.   Pulmonary/Chest: Effort normal and breath sounds normal. She has no wheezes. She has no rales.  Abdominal: Soft. Bowel sounds are normal. There is no tenderness.  Lymphadenopathy:    She has no cervical adenopathy.  Neurological: She is alert and oriented to person, place, and time.  Skin: Skin is warm and dry. No rash noted.  Psychiatric: She has a normal mood and affect. Her behavior is normal. Judgment and thought content normal.          Assessment & Plan:  1. Acute upper respiratory infection Allegra, Claritin, or Zyrtec can be used with flonase for symptoms of post nasal drip and allergic rhinitis. Mucinex DM for cough as needed. Advised patient on supportive measures:  Get rest, drink plenty of fluids, and use tylenol or ibuprofen as needed for discomfort. Follow up if fever >101, if symptoms worsen or if symptoms are not improved in 3-4 days. Patient verbalizes understanding.  Patient voiced understanding and agreed with plan.  2. Cough Mucinex DM for cough as needed.   Delano Metz, FNP-C

## 2016-01-22 ENCOUNTER — Ambulatory Visit (INDEPENDENT_AMBULATORY_CARE_PROVIDER_SITE_OTHER): Payer: BLUE CROSS/BLUE SHIELD | Admitting: Family Medicine

## 2016-01-22 ENCOUNTER — Encounter: Payer: Self-pay | Admitting: *Deleted

## 2016-01-22 ENCOUNTER — Encounter: Payer: Self-pay | Admitting: Family Medicine

## 2016-01-22 VITALS — BP 120/60 | HR 95 | Temp 97.9°F | Ht 60.0 in | Wt 111.0 lb

## 2016-01-22 DIAGNOSIS — R05 Cough: Secondary | ICD-10-CM

## 2016-01-22 DIAGNOSIS — J324 Chronic pansinusitis: Secondary | ICD-10-CM

## 2016-01-22 DIAGNOSIS — R059 Cough, unspecified: Secondary | ICD-10-CM

## 2016-01-22 MED ORDER — AMOXICILLIN-POT CLAVULANATE 875-125 MG PO TABS: 1.0000 | ORAL_TABLET | Freq: Two times a day (BID) | ORAL | Status: DC

## 2016-01-22 MED ORDER — HYDROCODONE-HOMATROPINE 5-1.5 MG/5ML PO SYRP
5.0000 mL | ORAL_SOLUTION | Freq: Three times a day (TID) | ORAL | Status: DC | PRN
Start: 2016-01-22 — End: 2016-02-16

## 2016-01-22 NOTE — Progress Notes (Signed)
Subjective:    Patient ID: Renee [name], female    DOB: 09/04/1970, 45 y.o.   MRN: [id number]  HPI  Renee [name] is a 45 year old female who presents today with symptoms of frontal sinus pressure/pain, rhinitis with green drainage present for 2 weeks. Associated symptom of a productive cough with green sputum that is disturbing her sleep, itchy watery eyes, hyponasal speech, and post nasal drip. No recent sick contact exposure. No history of asthma and she is a nonsmoker. She denies N/V/D, fever, chills, sweats, symptoms of GERD, and tooth pain. Treatment with flonase and claritin have provided limited benefit.   She was seen previously one week ago with similar symptoms that have not improved.    Review of Systems  Constitutional: Negative for fever and chills.  HENT: Positive for congestion, postnasal drip, rhinorrhea, sinus pressure and voice change. Negative for ear pain.   Eyes: Negative for redness and visual disturbance.  Respiratory: Negative for shortness of breath and wheezing.   Cardiovascular: Negative for chest pain and palpitations.  Gastrointestinal: Negative for nausea, vomiting, abdominal pain and diarrhea.  Genitourinary: Negative for dysuria, urgency, frequency and hematuria.  Musculoskeletal: Negative for myalgias and arthralgias.  Skin: Negative for rash.  Neurological: Negative for dizziness, light-headedness and headaches.   Past Medical History  Diagnosis Date  . Breast cancer ([place])     2010 ; left side mastectomy chemo pill  . Thyroid disease   . Hypothyroidism   . DVT (deep venous thrombosis) ([facility])     left arm  . Depression   . Severe major depression ([place]) 08/15/2014  . PTSD (post-traumatic stress disorder) 08/15/2014  . Osteopenia 09/01/2014     Social History   Social History  . Marital Status: Divorced    Spouse Name: N/A  . Number of Children: N/A  . Years of Education: N/A   Occupational History  . Not on file.   Social History Main  Topics  . Smoking status: Former Smoker -- 0.50 packs/day for 14 years    Quit date: 09/22/2010  . Smokeless tobacco: Never Used  . Alcohol Use: No  . Drug Use: No  . Sexual Activity: Not on file   Other Topics Concern  . Not on file   Social History Narrative    Past Surgical History  Procedure Laterality Date  . Mastectomy      left side  . Portacath placement    . Port-a-cath removal    . Breast reconstruction  01/2011    Family History  Problem Relation Age of Onset  . Hypertension Mother   . Diabetes Father   . Stroke Father   . Hypertension Father   . Hypertension Maternal Grandmother   . Diabetes Paternal Grandmother   . Cancer Paternal Grandfather     No Known Allergies  Current Outpatient Prescriptions on File Prior to Visit  Medication Sig Dispense Refill  . amphetamine-dextroamphetamine (ADDERALL) 10 MG tablet Take 1 tablet (10 mg total) by mouth 2 (two) times daily. 60 tablet 0  . anastrozole (ARIMIDEX) 1 MG tablet TAKE 1 TABLET EVERY DAY 30 tablet 5  . buPROPion (WELLBUTRIN XL) 150 MG 24 hr tablet Take 1 tablet (150 mg total) by mouth daily. 90 tablet 1  . calcium-vitamin D (OSCAL WITH D) 500-200 MG-UNIT per tablet Take 1 tablet by mouth 2 (two) times daily. 60 tablet 11  . levothyroxine (SYNTHROID, LEVOTHROID) 100 MCG tablet Take 1 tablet (100 mcg total) by mouth daily. [place]  tablet 1  . LORazepam (ATIVAN) 0.5 MG tablet TAKE TWO TABLETS BY MOUTH EVERY 8 HOURS AS NEEDED FOR ANXIETY 60 tablet 5  . naproxen sodium (ALEVE) 220 MG tablet Take 220 mg by mouth 2 (two) times daily with a meal.     No current facility-administered medications on file prior to visit.    BP 120/60 mmHg  Pulse 95  Temp(Src) 97.9 F (36.6 C) (Oral)  Ht 5' (1.524 m)  Wt 111 lb (50.349 kg)  BMI 21.68 kg/m2  SpO2 98%       Objective:   Physical Exam  Constitutional: She is oriented to person, place, and time. She appears well-developed and well-nourished.  HENT:  Right Ear:  Tympanic membrane normal.  Left Ear: Tympanic membrane normal.  Nose: Rhinorrhea present. Right sinus exhibits maxillary sinus tenderness and frontal sinus tenderness. Left sinus exhibits maxillary sinus tenderness and frontal sinus tenderness.  Mouth/Throat: Mucous membranes are normal. No oropharyngeal exudate or posterior oropharyngeal erythema.  Eyes: Pupils are equal, round, and reactive to light. No scleral icterus.  Neck: Neck supple.  Cardiovascular: Normal rate and regular rhythm.   Pulmonary/Chest: Effort normal and breath sounds normal. She has no wheezes.  Abdominal: Soft. Bowel sounds are normal.  Lymphadenopathy:    She has no cervical adenopathy.  Neurological: She is alert and oriented to person, place, and time.  Skin: Skin is warm and dry.  Psychiatric: She has a normal mood and affect. Her behavior is normal. Judgment and thought content normal.       Assessment & Plan:  1. Pansinusitis, unspecified chronicity Symptoms have been present for 2 weeks and have not improved with conservative, supportive measures. - amoxicillin-clavulanate (AUGMENTIN) 875-125 MG tablet; Take 1 tablet by mouth 2 (two) times daily.  Dispense: 20 tablet; Refill: 0  2. Cough  - HYDROcodone-homatropine (HYCODAN) 5-1.5 MG/5ML syrup; Take 5 mLs by mouth every 8 (eight) hours as needed for cough.  Dispense: 120 mL; Refill: 0  Advised patient on supportive measures:  Get rest, drink plenty of fluids, and follow up if fever >101, if symptoms worsen or if symptoms are not improved in 3-4 days with treatment. Patient verbalizes understanding and agreed with plan.  Provided written information for sinusitis with home care instructions. Work note provided  [employer], FNP-C

## 2016-01-22 NOTE — Patient Instructions (Signed)
Please take medications as directed and follow up if symptoms do not improve, worsen, or you develop a fever >101. Feel better soon! Sinusitis, Adult Sinusitis is redness, soreness, and inflammation of the paranasal sinuses. Paranasal sinuses are air pockets within the bones of your face. They are located beneath your eyes, in the middle of your forehead, and above your eyes. In healthy paranasal sinuses, mucus is able to drain out, and air is able to circulate through them by way of your nose. However, when your paranasal sinuses are inflamed, mucus and air can become trapped. This can allow bacteria and other germs to grow and cause infection. Sinusitis can develop quickly and last only a short time (acute) or continue over a long period (chronic). Sinusitis that lasts for more than 12 weeks is considered chronic. CAUSES Causes of sinusitis include:  Allergies.  Structural abnormalities, such as displacement of the cartilage that separates your nostrils (deviated septum), which can decrease the air flow through your nose and sinuses and affect sinus drainage.  Functional abnormalities, such as when the small hairs (cilia) that line your sinuses and help remove mucus do not work properly or are not present. SIGNS AND SYMPTOMS Symptoms of acute and chronic sinusitis are the same. The primary symptoms are pain and pressure around the affected sinuses. Other symptoms include:  Upper toothache.  Earache.  Headache.  Bad breath.  Decreased sense of smell and taste.  A cough, which worsens when you are lying flat.  Fatigue.  Fever.  Thick drainage from your nose, which often is green and may contain pus (purulent).  Swelling and warmth over the affected sinuses. DIAGNOSIS Your health care provider will perform a physical exam. During your exam, your health care provider may perform any of the following to help determine if you have acute sinusitis or chronic sinusitis:  Look in your  nose for signs of abnormal growths in your nostrils (nasal polyps).  Tap over the affected sinus to check for signs of infection.  View the inside of your sinuses using an imaging device that has a light attached (endoscope). If your health care provider suspects that you have chronic sinusitis, one or more of the following tests may be recommended:  Allergy tests.  Nasal culture. A sample of mucus is taken from your nose, sent to a lab, and screened for bacteria.  Nasal cytology. A sample of mucus is taken from your nose and examined by your health care provider to determine if your sinusitis is related to an allergy. TREATMENT Most cases of acute sinusitis are related to a viral infection and will resolve on their own within 10 days. Sometimes, medicines are prescribed to help relieve symptoms of both acute and chronic sinusitis. These may include pain medicines, decongestants, nasal steroid sprays, or saline sprays. However, for sinusitis related to a bacterial infection, your health care provider will prescribe antibiotic medicines. These are medicines that will help kill the bacteria causing the infection. Rarely, sinusitis is caused by a fungal infection. In these cases, your health care provider will prescribe antifungal medicine. For some cases of chronic sinusitis, surgery is needed. Generally, these are cases in which sinusitis recurs more than 3 times per year, despite other treatments. HOME CARE INSTRUCTIONS  Drink plenty of water. Water helps thin the mucus so your sinuses can drain more easily.  Use a humidifier.  Inhale steam 3-4 times a day (for example, sit in the bathroom with the shower running).  Apply a warm, moist  washcloth to your face 3-4 times a day, or as directed by your health care provider.  Use saline nasal sprays to help moisten and clean your sinuses.  Take medicines only as directed by your health care provider.  If you were prescribed either an  antibiotic or antifungal medicine, finish it all even if you start to feel better. SEEK IMMEDIATE MEDICAL CARE IF:  You have increasing pain or severe headaches.  You have nausea, vomiting, or drowsiness.  You have swelling around your face.  You have vision problems.  You have a stiff neck.  You have difficulty breathing.   This information is not intended to replace advice given to you by your health care provider. Make sure you discuss any questions you have with your health care provider.   Document Released: 07/26/2005 Document Revised: 08/16/2014 Document Reviewed: 08/10/2011 Elsevier Interactive Patient Education [employer].

## 2016-02-16 ENCOUNTER — Other Ambulatory Visit: Payer: Self-pay | Admitting: Internal Medicine

## 2016-02-16 ENCOUNTER — Ambulatory Visit (HOSPITAL_COMMUNITY)
Admission: RE | Admit: 2016-02-16 | Discharge: 2016-02-16 | Disposition: A | Discharge status: Home / Self Care | Payer: BLUE CROSS/BLUE SHIELD | Source: Ambulatory Visit | Attending: Cardiology | Admitting: Cardiology

## 2016-02-16 ENCOUNTER — Encounter: Payer: Self-pay | Admitting: Internal Medicine

## 2016-02-16 ENCOUNTER — Encounter (HOSPITAL_COMMUNITY): Payer: Self-pay | Admitting: *Deleted

## 2016-02-16 ENCOUNTER — Ambulatory Visit (INDEPENDENT_AMBULATORY_CARE_PROVIDER_SITE_OTHER): Payer: BLUE CROSS/BLUE SHIELD | Admitting: Internal Medicine

## 2016-02-16 ENCOUNTER — Emergency Department (HOSPITAL_COMMUNITY)
Admission: EM | Admit: 2016-02-16 | Discharge: 2016-02-17 | Disposition: A | Discharge status: Home / Self Care | Payer: BLUE CROSS/BLUE SHIELD | Attending: Emergency Medicine | Admitting: Emergency Medicine

## 2016-02-16 ENCOUNTER — Telehealth: Payer: Self-pay | Admitting: *Deleted

## 2016-02-16 ENCOUNTER — Emergency Department (HOSPITAL_COMMUNITY): Payer: BLUE CROSS/BLUE SHIELD

## 2016-02-16 VITALS — BP 110/70 | HR 81 | Temp 98.1°F | Resp 20 | Ht 60.0 in | Wt 113.0 lb

## 2016-02-16 DIAGNOSIS — Z87891 Personal history of nicotine dependence: Secondary | ICD-10-CM | POA: Insufficient documentation

## 2016-02-16 DIAGNOSIS — M25562 Pain in left knee: Secondary | ICD-10-CM

## 2016-02-16 DIAGNOSIS — Z853 Personal history of malignant neoplasm of breast: Secondary | ICD-10-CM | POA: Diagnosis not present

## 2016-02-16 DIAGNOSIS — M7989 Other specified soft tissue disorders: Secondary | ICD-10-CM

## 2016-02-16 DIAGNOSIS — M76899 Other specified enthesopathies of unspecified lower limb, excluding foot: Secondary | ICD-10-CM

## 2016-02-16 DIAGNOSIS — E785 Hyperlipidemia, unspecified: Secondary | ICD-10-CM

## 2016-02-16 DIAGNOSIS — Z79899 Other long term (current) drug therapy: Secondary | ICD-10-CM | POA: Diagnosis not present

## 2016-02-16 MED ORDER — OXYCODONE-ACETAMINOPHEN 5-325 MG PO TABS
ORAL_TABLET | ORAL | Status: DC
Start: 2016-02-16 — End: 2016-02-17
  Filled 2016-02-16: qty 1

## 2016-02-16 MED ORDER — TRAMADOL HCL 50 MG PO TABS: 50.0000 mg | ORAL_TABLET | Freq: Four times a day (QID) | ORAL | Status: DC | PRN

## 2016-02-16 MED ORDER — OXYCODONE-ACETAMINOPHEN 5-325 MG PO TABS
1.0000 | ORAL_TABLET | ORAL | Status: DC | PRN
  Administered 2016-02-16: 1 via ORAL

## 2016-02-16 MED ORDER — NAPROXEN 500 MG PO TABS
500.0000 mg | ORAL_TABLET | Freq: Two times a day (BID) | ORAL | Status: DC
Start: 2016-02-16 — End: 2016-03-05

## 2016-02-16 NOTE — Telephone Encounter (Signed)
-----   Message from Marletta Lor, MD sent at 02/16/2016  2:54 PM EDT ----- Regarding: FW: patient results Please schedule patient for orthopedic evaluation due to left knee pain.  Please notify patient that ultrasound was negative for clot and Baker's cyst ----- Message -----    From: Lupita Raider [place]    Sent: 02/16/2016   2:24 PM      To: Marletta Lor, MD Subject: patient results                                Patient is negative for thrombus of the left lower extremity.

## 2016-02-16 NOTE — ED Notes (Signed)
Pt reports left lower leg pain since last Wednesday. Pt had US done today which showed no blood clot. Pt denies any injury to left leg. Pt states pain has progressively gotten worse. Pt states she worked all weekend but pain is now unbearable. Pt has limited range of motion due to pain. No swelling noted, CMS intact.

## 2016-02-16 NOTE — Telephone Encounter (Signed)
Spoke to pt, told her Venous doppler U/S was negative for blood clot and Baker's cyst. Pt verbalized understanding. Told pt Dr.K is going to send you to Orthopedic to have knee evaluated, I will put order in urgent and someone will contact you to schedule an appt. Pt verbalized understanding. Order for referral to Ortho done.

## 2016-02-16 NOTE — Progress Notes (Signed)
Subjective:    Patient ID: Renee [name], female    DOB: 24-May-1971, 45 y.o.   MRN: [id number]  HPI  45 year old patient who presents with a chief complaint of left knee pain and swelling.  She was quite active on July 4.  The following day first noticed some discomfort in the left posterior knee.  Over the past few days she has had increasing discomfort primarily in the posterior knee.  No history of trauma.  Past Medical History  Diagnosis Date  . Breast cancer ([place])     2010 ; left side mastectomy chemo pill  . Thyroid disease   . Hypothyroidism   . DVT (deep venous thrombosis) ([facility])     left arm  . Depression   . Severe major depression ([place]) 08/15/2014  . PTSD (post-traumatic stress disorder) 08/15/2014  . Osteopenia 09/01/2014     Social History   Social History  . Marital Status: Divorced    Spouse Name: N/A  . Number of Children: N/A  . Years of Education: N/A   Occupational History  . Not on file.   Social History Main Topics  . Smoking status: Former Smoker -- 0.50 packs/day for 14 years    Quit date: 09/22/2010  . Smokeless tobacco: Never Used  . Alcohol Use: No  . Drug Use: No  . Sexual Activity: Not on file   Other Topics Concern  . Not on file   Social History Narrative    Past Surgical History  Procedure Laterality Date  . Mastectomy      left side  . Portacath placement    . Port-a-cath removal    . Breast reconstruction  01/2011    Family History  Problem Relation Age of Onset  . Hypertension Mother   . Diabetes Father   . Stroke Father   . Hypertension Father   . Hypertension Maternal Grandmother   . Diabetes Paternal Grandmother   . Cancer Paternal Grandfather     No Known Allergies  Current Outpatient Prescriptions on File Prior to Visit  Medication Sig Dispense Refill  . amphetamine-dextroamphetamine (ADDERALL) 10 MG tablet Take 1 tablet (10 mg total) by mouth 2 (two) times daily. 60 tablet 0  . anastrozole (ARIMIDEX) 1 MG  tablet TAKE 1 TABLET EVERY DAY 30 tablet 5  . calcium-vitamin D (OSCAL WITH D) 500-200 MG-UNIT per tablet Take 1 tablet by mouth 2 (two) times daily. 60 tablet 11  . levothyroxine (SYNTHROID, LEVOTHROID) 100 MCG tablet Take 1 tablet (100 mcg total) by mouth daily. 90 tablet 1  . LORazepam (ATIVAN) 0.5 MG tablet TAKE TWO TABLETS BY MOUTH EVERY 8 HOURS AS NEEDED FOR ANXIETY 60 tablet 5  . naproxen sodium (ALEVE) 220 MG tablet Take 220 mg by mouth 2 (two) times daily with a meal.     No current facility-administered medications on file prior to visit.    BP 110/70 mmHg  Pulse 81  Temp(Src) 98.1 F (36.7 C) (Oral)  Resp 20  Ht 5' (1.524 m)  Wt 113 lb (51.256 kg)  BMI 22.07 kg/m2  SpO2 97%     Review of Systems  Cardiovascular: Positive for leg swelling.  Musculoskeletal: Positive for arthralgias and gait problem.       Objective:   Physical Exam  Constitutional: She appears well-developed and well-nourished.  Most comfortable in a standing position  Musculoskeletal: She exhibits edema and tenderness.  Tenderness and some mild soft tissue swelling involving the left postero-lateral knee No obvious  effusion Left knee cool to touch No calf tenderness, but the calf appeared to be slightly swollen compared to the right Unable to fully extend the left knee          Assessment & Plan:   Left posterior lateral knee pain Left leg swelling  Rule out ruptured popliteal cyst, DVT.  Will schedule for lower extremity venous Doppler study  [name], MD

## 2016-02-16 NOTE — ED Provider Notes (Signed)
History  By signing my name below, I, Renee [name], attest that this documentation has been prepared under the direction and in the presence of Margarita Mail, PA-C. Electronically Signed: Bea [name], ED Scribe. 02/16/2016. 11:39 PM.  Chief Complaint  Patient presents with  . Leg Pain   The history is provided by the patient and medical records. No language interpreter was used.    HPI Comments:  Renee [name] is a 45 y.o. female who presents to the Emergency Department complaining of throbbing posterior left knee pain that began about five days ago. She states she was walking a lot the day before the pain began. She reports the pain is gradually worsening and has associated swelling. She reports going to her PCP and had an ultrasound and ruled out DVT and Baker's cyst. She states she has an appt with [facility] tomorrow. She has taken Aleve for pain and has been icing the area. Trying to move the LLE increases the pain. She denies numbness, tingling or weakness of the LLE, bruising, wounds, trauma, injury or fall. PMHx of left breast cancer, thyroid disease, left arm DVT, osteopenia.  Past Medical History  Diagnosis Date  . Breast cancer ([place])     2010 ; left side mastectomy chemo pill  . Thyroid disease   . Hypothyroidism   . DVT (deep venous thrombosis) ([facility])     left arm  . Depression   . Severe major depression ([place]) 08/15/2014  . PTSD (post-traumatic stress disorder) 08/15/2014  . Osteopenia 09/01/2014   Past Surgical History  Procedure Laterality Date  . Mastectomy      left side  . Portacath placement    . Port-a-cath removal    . Breast reconstruction  01/2011   Family History  Problem Relation Age of Onset  . Hypertension Mother   . Diabetes Father   . Stroke Father   . Hypertension Father   . Hypertension Maternal Grandmother   . Diabetes Paternal Grandmother   . Cancer Paternal Grandfather    Social History  Substance Use Topics  .  Smoking status: Former Smoker -- 0.50 packs/day for 14 years    Quit date: 09/22/2010  . Smokeless tobacco: Never Used  . Alcohol Use: No   OB History    No data available     Review of Systems  Musculoskeletal: Positive for arthralgias.  Skin: Negative for color change and wound.  Neurological: Negative for weakness and numbness.    Allergies  Review of patient's allergies indicates no known allergies.  Home Medications   Prior to Admission medications   Medication Sig Start Date End Date Taking? Authorizing Provider  amphetamine-dextroamphetamine (ADDERALL) 10 MG tablet Take 1 tablet (10 mg total) by mouth 2 (two) times daily. 11/28/15   Marletta Lor, MD  anastrozole (ARIMIDEX) 1 MG tablet TAKE 1 TABLET EVERY DAY 12/01/15   Marletta Lor, MD  calcium-vitamin D (OSCAL WITH D) 500-200 MG-UNIT per tablet Take 1 tablet by mouth 2 (two) times daily. 09/04/14   [facility], PA-C  levothyroxine (SYNTHROID, LEVOTHROID) 100 MCG tablet Take 1 tablet (100 mcg total) by mouth daily. 05/08/15   Marletta Lor, MD  LORazepam (ATIVAN) 0.5 MG tablet TAKE TWO TABLETS BY MOUTH EVERY 8 HOURS AS NEEDED FOR ANXIETY 05/08/15   Marletta Lor, MD  naproxen sodium (ALEVE) 220 MG tablet Take 220 mg by mouth 2 (two) times daily with a meal.    Historical Provider, MD  Triage Vitals: BP 131/82 mmHg  Pulse 75  Temp(Src) 97.7 F (36.5 C) (Oral)  Resp 19  Ht 5' (1.524 m)  Wt 113 lb (51.256 kg)  BMI 22.07 kg/m2  SpO2 100% Physical Exam  Constitutional: She is oriented to person, place, and time. She appears well-developed and well-nourished.  HENT:  Head: Normocephalic and atraumatic.  Eyes: EOM are normal.  Neck: Normal range of motion.  Cardiovascular: Normal rate.   Pulmonary/Chest: Effort normal.  Musculoskeletal: She exhibits tenderness.  Exquisitely tender to palpation at insertion of biceps femoris tendon of left leg. Mild swelling. Severe pain with extension of left  knee.  Neurological: She is alert and oriented to person, place, and time.  Skin: Skin is warm and dry.  Psychiatric: She has a normal mood and affect. Her behavior is normal.  Nursing note and vitals reviewed.   ED Course  Procedures (including critical care time) DIAGNOSTIC STUDIES: Oxygen Saturation is 100% on RA, normal by my interpretation.   COORDINATION OF CARE: 11:26 PM- Encouraged pt to keep appt with orthopedist tomorrow and continue to RICE the area. Will prescribe Lidocaine patches, Naproxen and Tramadol. Will order knee sleeve and crutches. Pt verbalizes understanding and agrees to plan.  Medications  oxyCODONE-acetaminophen (PERCOCET/ROXICET) 5-325 MG per tablet 1 tablet (1 tablet Oral Given 02/16/16 2146)  oxyCODONE-acetaminophen (PERCOCET/ROXICET) 5-325 MG per tablet (not administered)   Labs Review Labs Reviewed - No data to display  Imaging Review Dg Knee Complete 4 Views Left  02/16/2016  CLINICAL DATA:  Left posterior knee pain. EXAM: LEFT KNEE - COMPLETE 4+ VIEW COMPARISON:  None. FINDINGS: No evidence of fracture, dislocation, or joint effusion. Dystrophic calcification adjacent to the medial femoral condyle likely reflecting sequela prior MCL injury. Joint spaces are relatively well maintained. Soft tissues are unremarkable. IMPRESSION: No acute osseous injury of the left knee. Electronically Signed   By: Kathreen Devoid   On: 02/16/2016 22:28   I have personally reviewed and evaluated these images and lab results as part of my medical decision-making.   EKG Interpretation None      MDM   Final diagnoses:  Knee pain, acute, left    Patient X-Ray negative for obvious fracture or dislocation.  Pt advised to follow up with orthopedics. Patient given knee sleeve while in ED, conservative therapy recommended and discussed. Patient will be discharged home & is agreeable with above plan. Returns precautions discussed. Pt appears safe for discharge.   I personally  performed the services described in this documentation, which was scribed in my presence. The recorded information has been reviewed and is accurate.   =    Margarita Mail, PA-C 02/17/16 0119  Davonna Belling, MD 02/18/16 [phone]

## 2016-02-16 NOTE — Progress Notes (Signed)
Pre visit review using our clinic review tool, if applicable. No additional management support is needed unless otherwise documented below in the visit note. 

## 2016-02-16 NOTE — Patient Instructions (Signed)
You  may move around, but avoid painful motions and activities.  Apply ice to the sore area for 15 to 20 minutes 3 or 4 times daily for the next two to 3 days.  Venous Doppler ultrasound as discussed

## 2016-02-16 NOTE — Discharge Instructions (Signed)
Cryotherapy °Cryotherapy means treatment with cold. Ice or gel packs can be used to reduce both pain and swelling. Ice is the most helpful within the first 24 to 48 hours after an injury or flare-up from overusing a muscle or joint. Sprains, strains, spasms, burning pain, shooting pain, and aches can all be eased with ice. Ice can also be used when recovering from surgery. Ice is effective, has very few side effects, and is safe for most people to use. °PRECAUTIONS  °Ice is not a safe treatment option for people with: °· Raynaud phenomenon. This is a condition affecting small blood vessels in the extremities. Exposure to cold may cause your problems to return. °· Cold hypersensitivity. There are many forms of cold hypersensitivity, including: °· Cold urticaria. Red, itchy hives appear on the skin when the tissues begin to warm after being iced. °· Cold erythema. This is a red, itchy rash caused by exposure to cold. °· Cold hemoglobinuria. Red blood cells break down when the tissues begin to warm after being iced. The hemoglobin that carry oxygen are passed into the urine because they cannot combine with blood proteins fast enough. °· Numbness or altered sensitivity in the area being iced. °If you have any of the following conditions, do not use ice until you have discussed cryotherapy with your caregiver: °· Heart conditions, such as arrhythmia, angina, or chronic heart disease. °· High blood pressure. °· Healing wounds or open skin in the area being iced. °· Current infections. °· Rheumatoid arthritis. °· Poor circulation. °· Diabetes. °Ice slows the blood flow in the region it is applied. This is beneficial when trying to stop inflamed tissues from spreading irritating chemicals to surrounding tissues. However, if you expose your skin to cold temperatures for too long or without the proper protection, you can damage your skin or nerves. Watch for signs of skin damage due to cold. °HOME CARE INSTRUCTIONS °Follow  these tips to use ice and cold packs safely. °· Place a dry or damp towel between the ice and skin. A damp towel will cool the skin more quickly, so you may need to shorten the time that the ice is used. °· For a more rapid response, add gentle compression to the ice. °· Ice for no more than 10 to 20 minutes at a time. The bonier the area you are icing, the less time it will take to get the benefits of ice. °· Check your skin after 5 minutes to make sure there are no signs of a poor response to cold or skin damage. °· Rest 20 minutes or more between uses. °· Once your skin is numb, you can end your treatment. You can test numbness by very lightly touching your skin. The touch should be so light that you do not see the skin dimple from the pressure of your fingertip. When using ice, most people will feel these normal sensations in this order: cold, burning, aching, and numbness. °· Do not use ice on someone who cannot communicate their responses to pain, such as small children or people with dementia. °HOW TO MAKE AN ICE PACK °Ice packs are the most common way to use ice therapy. Other methods include ice massage, ice baths, and cryosprays. Muscle creams that cause a cold, tingly feeling do not offer the same benefits that ice offers and should not be used as a substitute unless recommended by your caregiver. °To make an ice pack, do one of the following: °· Place crushed ice or a   bag of frozen vegetables in a sealable plastic bag. Squeeze out the excess air. Place this bag inside another plastic bag. Slide the bag into a pillowcase or place a damp towel between your skin and the bag.  Mix 3 parts water with 1 part rubbing alcohol. Freeze the mixture in a sealable plastic bag. When you remove the mixture from the freezer, it will be slushy. Squeeze out the excess air. Place this bag inside another plastic bag. Slide the bag into a pillowcase or place a damp towel between your skin and the bag. SEEK MEDICAL CARE  IF:  You develop white spots on your skin. This may give the skin a blotchy (mottled) appearance.  Your skin turns blue or pale.  Your skin becomes waxy or hard.  Your swelling gets worse. MAKE SURE YOU:   Understand these instructions.  Will watch your condition.  Will get help right away if you are not doing well or get worse.   This information is not intended to replace advice given to you by your health care provider. Make sure you discuss any questions you have with your health care provider.   Document Released: 03/22/2011 Document Revised: 08/16/2014 Document Reviewed: 03/22/2011 Elsevier Interactive Patient Education 2016 [employer]  Tendinitis and Tenosynovitis  Tendinitis is inflammation of the tendon. Tenosynovitis is inflammation of the lining around the tendon (tendon sheath). These painful conditions often occur at once. Tendons attach muscle to bone. To move a limb, force from the muscle moves through the tendon, to the bone. These conditions often cause increased pain when moving. Tendinitis may be caused by a small or partial tear in the tendon.  SYMPTOMS   Pain, tenderness, redness, bruising, or swelling at the injury.  Loss of normal joint movement.  Pain that gets worse with use of the muscle and joint attached to the tendon.  Weakness in the tendon, caused by calcium build up that may occur with tendinitis.  Commonly affected tendons:  Achilles tendon (calf of leg).  Rotator cuff (shoulder joint).  Patellar tendon (kneecap to shin).  Peroneal tendon (ankle).  Posterior tibial tendon (inner ankle).  Biceps tendon (in front of shoulder). CAUSES   Sudden strain on a flexed muscle, muscle overuse, sudden increase or change in activity, vigorous activity.  Result of a direct hit (less common).  Poor muscle action (biomechanics). RISK INCREASES WITH:  Injury (trauma).  Too much exercise.  Sudden change in athletic activity.  Incorrect  exercise form or technique.  Poor strength and flexibility.  Not warming-up properly before activity.  Returning to activity before healing is complete. PREVENTION   Warm-up and stretch properly before activity.  Maintain physical fitness:  Joint flexibility.  Muscle strength and endurance.  Fitness that increases heart rate.  Learn and use proper exercise techniques.  Use rehabilitation exercises to strengthen weak muscles and tendons.  Ice the tendon after activity, to reduce recurring inflammation.  Wear proper fitting protective equipment for specific tendons, when indicated. PROGNOSIS  When treated properly, can be cured in 6 to 8 weeks. Recovery may take longer, depending on degree of injury.  RELATED COMPLICATIONS   Re-injury or recurring symptoms.  Permanent weakness or joint stiffness, if injury is severe and recovery is not completed.  Delayed healing, if sports are started before healing is complete.  Tearing apart (rupture) of the inflamed tendon. Tendinitis means the tendon is injured and must recover. TREATMENT  Treatment first involves ice, medicine, and rest from aggravating activities. This reduces  pain and inflammation. Modifying your activity may be considered to prevent recurring injury. A brace, elastic bandage wrap, splint, cast, or sling may be prescribed to protect the joint for a short period. After that period, strengthening and stretching exercise may help to regain strength and full range of motion. If the condition persists, despite non-surgical treatment, surgery may be recommended to remove the inflamed tendon lining. Corticosteroid injections may be given to reduce inflammation. However, these injections may weaken the tendon and increase your risk for tendon rupture. MEDICATION   If pain medicine is needed, nonsteroidal anti-inflammatory medicines (aspirin and ibuprofen), or other minor pain relievers (acetaminophen), are often  recommended.  Do not take pain medicine for 7 days before surgery.  Prescription pain relievers are usually prescribed only after surgery. Use only as directed and only as much as you need.  Ointments applied to the skin may be helpful.  Corticosteroid injections may be given to reduce inflammation. However, this may increase your risk of a tendon rupture. HEAT AND COLD  Cold treatment (icing) relieves pain and reduces inflammation. Cold treatment should be applied for 10 to 15 minutes every 2 to 3 hours, and immediately after activity that aggravates your symptoms. Use ice packs or an ice massage.  Heat treatment may be used before performing stretching and strengthening activities prescribed by your caregiver, physical therapist, or athletic trainer. Use a heat pack or a warm water soak. SEEK MEDICAL CARE IF:   Symptoms get worse or do not improve, despite treatment.  Pain becomes too much to tolerate.  You develop numbness or tingling.  Toes become cold, or toenails become blue, gray, or dark colored.  New, unexplained symptoms develop. (Drugs used in treatment may produce side effects.)   This information is not intended to replace advice given to you by your health care provider. Make sure you discuss any questions you have with your health care provider.   Document Released: 07/26/2005 Document Revised: 10/18/2011 Document Reviewed: 11/07/2008 Elsevier Interactive Patient Education 2016 [employer]  Knee Pain Knee pain is a very common symptom and can have many causes. Knee pain often goes away when you follow your health care provider's instructions for relieving pain and discomfort at home. However, knee pain can develop into a condition that needs treatment. Some conditions may include:  Arthritis caused by wear and tear (osteoarthritis).  Arthritis caused by swelling and irritation (rheumatoid arthritis or gout).  A cyst or growth in your knee.  An infection in  your knee joint.  An injury that will not heal.  Damage, swelling, or irritation of the tissues that support your knee (torn ligaments or tendinitis). If your knee pain continues, additional tests may be ordered to diagnose your condition. Tests may include X-rays or other imaging studies of your knee. You may also need to have fluid removed from your knee. Treatment for ongoing knee pain depends on the cause, but treatment may include:  Medicines to relieve pain or swelling.  Steroid injections in your knee.  Physical therapy.  Surgery. HOME CARE INSTRUCTIONS  Take medicines only as directed by your health care provider.  Rest your knee and keep it raised (elevated) while you are resting.  Do not do things that cause or worsen pain.  Avoid high-impact activities or exercises, such as running, jumping rope, or doing jumping jacks.  Apply ice to the knee area:  Put ice in a plastic bag.  Place a towel between your skin and the bag.  Leave  the ice on for 20 minutes, 2-3 times a day.  Ask your health care provider if you should wear an elastic knee support.  Keep a pillow under your knee when you sleep.  Lose weight if you are overweight. Extra weight can put pressure on your knee.  Do not use any tobacco products, including cigarettes, chewing tobacco, or electronic cigarettes. If you need help quitting, ask your health care provider. Smoking may slow the healing of any bone and joint problems that you may have. SEEK MEDICAL CARE IF:  Your knee pain continues, changes, or gets worse.  You have a fever along with knee pain.  Your knee buckles or locks up.  Your knee becomes more swollen. SEEK IMMEDIATE MEDICAL CARE IF:   Your knee joint feels hot to the touch.  You have chest pain or trouble breathing.   This information is not intended to replace advice given to you by your health care provider. Make sure you discuss any questions you have with your health care  provider.   Document Released: 05/23/2007 Document Revised: 08/16/2014 Document Reviewed: 03/11/2014 Elsevier Interactive Patient Education [employer].

## 2016-02-18 ENCOUNTER — Ambulatory Visit (HOSPITAL_COMMUNITY)
Admission: RE | Admit: 2016-02-18 | Discharge: 2016-02-18 | Disposition: A | Discharge status: Home / Self Care | Payer: BLUE CROSS/BLUE SHIELD | Source: Ambulatory Visit | Attending: Orthopedic Surgery | Admitting: Orthopedic Surgery

## 2016-02-18 ENCOUNTER — Other Ambulatory Visit (HOSPITAL_COMMUNITY): Payer: Self-pay | Admitting: Orthopedic Surgery

## 2016-02-18 DIAGNOSIS — S76822A Laceration of other specified muscles, fascia and tendons at thigh level, left thigh, initial encounter: Secondary | ICD-10-CM | POA: Diagnosis not present

## 2016-02-18 DIAGNOSIS — S83422A Sprain of lateral collateral ligament of left knee, initial encounter: Secondary | ICD-10-CM | POA: Insufficient documentation

## 2016-02-18 DIAGNOSIS — S83242A Other tear of medial meniscus, current injury, left knee, initial encounter: Secondary | ICD-10-CM | POA: Diagnosis not present

## 2016-02-18 DIAGNOSIS — M25562 Pain in left knee: Secondary | ICD-10-CM

## 2016-02-18 DIAGNOSIS — M25462 Effusion, left knee: Secondary | ICD-10-CM | POA: Insufficient documentation

## 2016-02-18 DIAGNOSIS — X58XXXA Exposure to other specified factors, initial encounter: Secondary | ICD-10-CM | POA: Insufficient documentation

## 2016-02-26 ENCOUNTER — Telehealth: Payer: Self-pay | Admitting: Internal Medicine

## 2016-02-26 NOTE — Telephone Encounter (Signed)
Pt need new Rx for Adderall °

## 2016-02-27 NOTE — Telephone Encounter (Signed)
Patient requesting RF of adderall. Last Appt 02/16/16 No upcoming appointment Last RX 11/28/15 Please advise.

## 2016-03-01 ENCOUNTER — Telehealth: Payer: Self-pay | Admitting: *Deleted

## 2016-03-01 MED ORDER — AMPHETAMINE-DEXTROAMPHETAMINE 10 MG PO TABS: 10.0000 mg | ORAL_TABLET | Freq: Two times a day (BID) | ORAL | 0 refills | Status: DC

## 2016-03-01 NOTE — Telephone Encounter (Signed)
Pt calling to check the status of the Rx and Abbie states it will be ready at 4:30 at the latest 4:45.

## 2016-03-01 NOTE — Telephone Encounter (Signed)
Okay to refill? 

## 2016-03-01 NOTE — Telephone Encounter (Signed)
Rx for Adderall printed out and put in patient pick up.

## 2016-03-05 ENCOUNTER — Encounter (HOSPITAL_COMMUNITY): Payer: Self-pay | Admitting: Oncology

## 2016-03-05 ENCOUNTER — Encounter (HOSPITAL_COMMUNITY): Payer: BLUE CROSS/BLUE SHIELD | Attending: Hematology & Oncology

## 2016-03-05 ENCOUNTER — Ambulatory Visit (HOSPITAL_COMMUNITY): Payer: BLUE CROSS/BLUE SHIELD

## 2016-03-05 ENCOUNTER — Encounter (HOSPITAL_COMMUNITY): Payer: BLUE CROSS/BLUE SHIELD

## 2016-03-05 ENCOUNTER — Encounter (HOSPITAL_COMMUNITY): Payer: BLUE CROSS/BLUE SHIELD | Admitting: Oncology

## 2016-03-05 ENCOUNTER — Other Ambulatory Visit (HOSPITAL_COMMUNITY): Payer: BLUE CROSS/BLUE SHIELD

## 2016-03-05 ENCOUNTER — Ambulatory Visit (HOSPITAL_COMMUNITY): Payer: BLUE CROSS/BLUE SHIELD | Admitting: Oncology

## 2016-03-05 DIAGNOSIS — C50912 Malignant neoplasm of unspecified site of left female breast: Secondary | ICD-10-CM

## 2016-03-05 DIAGNOSIS — F4310 Post-traumatic stress disorder, unspecified: Secondary | ICD-10-CM | POA: Insufficient documentation

## 2016-03-05 DIAGNOSIS — Z853 Personal history of malignant neoplasm of breast: Secondary | ICD-10-CM | POA: Insufficient documentation

## 2016-03-05 DIAGNOSIS — Z08 Encounter for follow-up examination after completed treatment for malignant neoplasm: Secondary | ICD-10-CM | POA: Diagnosis not present

## 2016-03-05 DIAGNOSIS — M545 Low back pain, unspecified: Secondary | ICD-10-CM

## 2016-03-05 DIAGNOSIS — Z9221 Personal history of antineoplastic chemotherapy: Secondary | ICD-10-CM | POA: Insufficient documentation

## 2016-03-05 DIAGNOSIS — Z9889 Other specified postprocedural states: Secondary | ICD-10-CM | POA: Insufficient documentation

## 2016-03-05 DIAGNOSIS — R634 Abnormal weight loss: Secondary | ICD-10-CM

## 2016-03-05 DIAGNOSIS — M8580 Other specified disorders of bone density and structure, unspecified site: Secondary | ICD-10-CM

## 2016-03-05 LAB — CBC WITH DIFFERENTIAL/PLATELET
BASOS PCT: 0 %
Basophils Absolute: 0 10*3/uL (ref 0.0–0.1)
Eosinophils Absolute: 0.1 10*3/uL (ref 0.0–0.7)
Eosinophils Relative: 1 %
HEMATOCRIT: 40.6 % (ref 36.0–46.0)
HEMOGLOBIN: 13.5 g/dL (ref 12.0–15.0)
LYMPHS ABS: 1.9 10*3/uL (ref 0.7–4.0)
Lymphocytes Relative: 22 %
MCH: 33.7 pg (ref 26.0–34.0)
MCHC: 33.3 g/dL (ref 30.0–36.0)
MCV: 101.2 fL — ABNORMAL HIGH (ref 78.0–100.0)
MONOS PCT: 6 %
Monocytes Absolute: 0.6 10*3/uL (ref 0.1–1.0)
NEUTROS PCT: 71 %
Neutro Abs: 6 10*3/uL (ref 1.7–7.7)
Platelets: 212 10*3/uL (ref 150–400)
RBC: 4.01 MIL/uL (ref 3.87–5.11)
RDW: 13 % (ref 11.5–15.5)
WBC: 8.5 10*3/uL (ref 4.0–10.5)

## 2016-03-05 LAB — COMPREHENSIVE METABOLIC PANEL
ALT: 16 U/L (ref 14–54)
ANION GAP: 4 — AB (ref 5–15)
AST: 20 U/L (ref 15–41)
Albumin: 4.1 g/dL (ref 3.5–5.0)
Alkaline Phosphatase: 72 U/L (ref 38–126)
BUN: 14 mg/dL (ref 6–20)
CALCIUM: 8.9 mg/dL (ref 8.9–10.3)
CHLORIDE: 105 mmol/L (ref 101–111)
CO2: 28 mmol/L (ref 22–32)
Creatinine, Ser: 0.73 mg/dL (ref 0.44–1.00)
GFR calc non Af Amer: >60 mL/min (ref >60)
Glucose, Bld: 102 mg/dL — ABNORMAL HIGH (ref 65–99)
Potassium: 3.9 mmol/L (ref 3.5–5.1)
SODIUM: 137 mmol/L (ref 135–145)
Total Bilirubin: 0.5 mg/dL (ref 0.3–1.2)
Total Protein: 7.4 g/dL (ref 6.5–8.1)

## 2016-03-05 NOTE — Assessment & Plan Note (Signed)
Osteopenia in the setting of AI therapy for breast cancer.  On Ca++, Vit D, and Prolia beginning on 03/18/2015.  Oncology Flowsheet 09/04/2015  denosumab (PROLIA) [place] 60 mg   Labs today: CMET.  I personally reviewed and went over laboratory results with the patient.  The results are noted within this dictation.  Prolia injection today and again in 6 months.  Last bone density was in Jan 2016.  She will be due for another bone density exam in Jan 2018.  Labs in 6 months: CMET.

## 2016-03-05 NOTE — Assessment & Plan Note (Addendum)
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 Both metastases greater than 3 mm but she also had extracapsular extension. No LV I was seen. ER +96%, PR +11%, Ki-67 20%, HER-2/neu nonamplified. Date of surgery was on 10/08/2009. She had dose dense epirubicin/Cytoxan for 4 cycles followed by weekly Taxol for 12 weeks followed by radiation therapy by Dr. John Moody finishing on 06/19/2010. She then started tamoxifen on 06/23/2010 and it was proposed that she will continue this for 10 years, but her hormone status demonstrates a post-menopausal state and therefore, she was switched to Arimidex on 04/03/2014.    Labs today: CBC diff, CMET.  I personally reviewed and went over laboratory results with the patient.  The results are noted within this dictation.  I personally reviewed and went over radiographic studies with the patient.  The results are noted within this dictation.  CT CAP performed in Jan 2017 was negative for any signs of recurrence of disease to explain her past weight loss.  She is due for a mammogram in September 2017.  Order is placed.  She completed 5 years of anti-endocrine therapy in November 2016.  Return in 6 months for follow-up with labs. 

## 2016-03-05 NOTE — Progress Notes (Signed)
Pt was in clinic and partially worked up for visit. Pt decided since her Prolia injection was not authorized that she would reschedule the entire visit for another  Time.

## 2016-03-05 NOTE — Progress Notes (Signed)
-  Rescheduled-  ROS

## 2016-03-08 ENCOUNTER — Encounter (HOSPITAL_COMMUNITY): Payer: Self-pay

## 2016-03-08 ENCOUNTER — Other Ambulatory Visit: Payer: Self-pay | Admitting: Internal Medicine

## 2016-03-10 ENCOUNTER — Ambulatory Visit (HOSPITAL_COMMUNITY): Payer: BLUE CROSS/BLUE SHIELD

## 2016-03-25 ENCOUNTER — Other Ambulatory Visit (HOSPITAL_COMMUNITY): Payer: Self-pay

## 2016-03-25 DIAGNOSIS — C50912 Malignant neoplasm of unspecified site of left female breast: Secondary | ICD-10-CM

## 2016-03-25 DIAGNOSIS — D509 Iron deficiency anemia, unspecified: Secondary | ICD-10-CM

## 2016-03-26 ENCOUNTER — Encounter (HOSPITAL_COMMUNITY): Payer: BLUE CROSS/BLUE SHIELD | Attending: Hematology & Oncology | Admitting: Oncology

## 2016-03-26 ENCOUNTER — Encounter (HOSPITAL_COMMUNITY): Payer: BLUE CROSS/BLUE SHIELD

## 2016-03-26 ENCOUNTER — Encounter (HOSPITAL_COMMUNITY): Payer: Self-pay | Admitting: Oncology

## 2016-03-26 VITALS — BP 123/78 | HR 78 | Temp 98.1°F | Resp 16 | Wt 110.6 lb

## 2016-03-26 DIAGNOSIS — C50912 Malignant neoplasm of unspecified site of left female breast: Secondary | ICD-10-CM

## 2016-03-26 DIAGNOSIS — M8580 Other specified disorders of bone density and structure, unspecified site: Secondary | ICD-10-CM

## 2016-03-26 DIAGNOSIS — Z9221 Personal history of antineoplastic chemotherapy: Secondary | ICD-10-CM | POA: Insufficient documentation

## 2016-03-26 DIAGNOSIS — M545 Low back pain, unspecified: Secondary | ICD-10-CM

## 2016-03-26 DIAGNOSIS — R634 Abnormal weight loss: Secondary | ICD-10-CM

## 2016-03-26 DIAGNOSIS — Z170 Estrogen receptor positive status [ER+]: Secondary | ICD-10-CM | POA: Diagnosis not present

## 2016-03-26 DIAGNOSIS — C773 Secondary and unspecified malignant neoplasm of axilla and upper limb lymph nodes: Secondary | ICD-10-CM

## 2016-03-26 DIAGNOSIS — Z9889 Other specified postprocedural states: Secondary | ICD-10-CM | POA: Diagnosis not present

## 2016-03-26 DIAGNOSIS — Z08 Encounter for follow-up examination after completed treatment for malignant neoplasm: Secondary | ICD-10-CM | POA: Diagnosis present

## 2016-03-26 DIAGNOSIS — F4310 Post-traumatic stress disorder, unspecified: Secondary | ICD-10-CM | POA: Diagnosis not present

## 2016-03-26 DIAGNOSIS — Z780 Asymptomatic menopausal state: Secondary | ICD-10-CM

## 2016-03-26 DIAGNOSIS — Z853 Personal history of malignant neoplasm of breast: Secondary | ICD-10-CM | POA: Insufficient documentation

## 2016-03-26 DIAGNOSIS — D509 Iron deficiency anemia, unspecified: Secondary | ICD-10-CM

## 2016-03-26 LAB — CBC WITH DIFFERENTIAL/PLATELET
BASOS ABS: 0 10*3/uL (ref 0.0–0.1)
Basophils Relative: 0 %
EOS PCT: 2 %
Eosinophils Absolute: 0.1 10*3/uL (ref 0.0–0.7)
HCT: 40.4 % (ref 36.0–46.0)
Hemoglobin: 13.7 g/dL (ref 12.0–15.0)
LYMPHS PCT: 26 %
Lymphs Abs: 2 10*3/uL (ref 0.7–4.0)
MCH: 34.4 pg — ABNORMAL HIGH (ref 26.0–34.0)
MCHC: 33.9 g/dL (ref 30.0–36.0)
MCV: 101.5 fL — AB (ref 78.0–100.0)
MONO ABS: 0.6 10*3/uL (ref 0.1–1.0)
MONOS PCT: 8 %
Neutro Abs: 5 10*3/uL (ref 1.7–7.7)
Neutrophils Relative %: 64 %
PLATELETS: 186 10*3/uL (ref 150–400)
RBC: 3.98 MIL/uL (ref 3.87–5.11)
RDW: 13.1 % (ref 11.5–15.5)
WBC: 7.8 10*3/uL (ref 4.0–10.5)

## 2016-03-26 LAB — COMPREHENSIVE METABOLIC PANEL
ALBUMIN: 3.9 g/dL (ref 3.5–5.0)
ALK PHOS: 64 U/L (ref 38–126)
ALT: 18 U/L (ref 14–54)
AST: 25 U/L (ref 15–41)
Anion gap: 7 (ref 5–15)
BUN: 13 mg/dL (ref 6–20)
CALCIUM: 9.6 mg/dL (ref 8.9–10.3)
CO2: 28 mmol/L (ref 22–32)
Chloride: 102 mmol/L (ref 101–111)
Creatinine, Ser: 0.81 mg/dL (ref 0.44–1.00)
GFR calc Af Amer: >60 mL/min (ref >60)
GFR calc non Af Amer: >60 mL/min (ref >60)
GLUCOSE: 110 mg/dL — AB (ref 65–99)
POTASSIUM: 3.9 mmol/L (ref 3.5–5.1)
Sodium: 137 mmol/L (ref 135–145)
TOTAL PROTEIN: 6.9 g/dL (ref 6.5–8.1)
Total Bilirubin: 0.4 mg/dL (ref 0.3–1.2)

## 2016-03-26 MED ORDER — DENOSUMAB 60 MG/ML ~~LOC~~ SOLN
60.0000 mg | Freq: Once | SUBCUTANEOUS | Status: AC
  Administered 2016-03-26: 60 mg via SUBCUTANEOUS
  Filled 2016-03-26: qty 1

## 2016-03-26 NOTE — Progress Notes (Signed)
Renee [name]'s reason for visit today is for an injection and labs as scheduled per MD orders.  Labs were drawn prior to administration of ordered medication.Renee [name] also received prolia per MD orders; see [facility] for administration details.  Renee [name] tolerated all procedures well and without incident; questions were answered and patient was discharged.

## 2016-03-26 NOTE — Assessment & Plan Note (Signed)
Osteopenia in the setting of AI therapy for breast cancer.  On Ca++, Vit D, and Prolia beginning on 03/18/2015.  Oncology Flowsheet 09/04/2015  denosumab (PROLIA) [place] 60 mg   Labs today: CMET.  I personally reviewed and went over laboratory results with the patient.  The results are noted within this dictation.  Prolia injection today and again in 6 months.  Last bone density was in Jan 2016.  She will be due for another bone density exam in Jan 2018.  Labs in 6 months: CMET.

## 2016-03-26 NOTE — Assessment & Plan Note (Signed)
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 Both metastases greater than 3 mm but she also had extracapsular extension. No LV I was seen. ER +96%, PR +11%, Ki-67 20%, HER-2/neu nonamplified. Date of surgery was on 10/08/2009. She had dose dense epirubicin/Cytoxan for 4 cycles followed by weekly Taxol for 12 weeks followed by radiation therapy by Dr. Kyung Rudd finishing on 06/19/2010. She then started tamoxifen on 06/23/2010 and it was proposed that she will continue this for 10 years, but her hormone status demonstrates a post-menopausal state and therefore, she was switched to Arimidex on 04/03/2014.    Labs today: CBC diff, CMET.  I personally reviewed and went over laboratory results with the patient.  The results are noted within this dictation.  I personally reviewed and went over radiographic studies with the patient.  The results are noted within this dictation.  CT CAP performed in Jan 2017 was negative for any signs of recurrence of disease to explain her past weight loss.  She is due for a mammogram in September 2017.  Order is placed.  She completed 5 years of anti-endocrine therapy in November 2016.  Return in 6 months for follow-up with labs.

## 2016-03-26 NOTE — Progress Notes (Signed)
Please see doctors encounter for more inforamtion 

## 2016-03-26 NOTE — Progress Notes (Signed)
Renee Cowden, MD [place] [address]  Post-menopausal - Plan: DG Bone Density  Infiltrating ductal carcinoma of breast, left ([place]) - Plan: DG Bone Density  Osteopenia - Plan: DG Bone Density  CURRENT THERAPY: Arimidex daily beginning on 04/03/2014 after switching from Tamoxifen.   INTERVAL HISTORY: Renee [name] 45 y.o. 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 Both metastases greater than 3 mm but she also had extracapsular extension. No LV I was seen. ER +96%, PR +11%, Ki-67 20%, HER-2/neu nonamplified. Date of surgery was on 10/08/2009. She had dose dense epirubicin/Cytoxan for 4 cycles followed by weekly Taxol for 12 weeks followed by radiation therapy by Dr. Kyung Rudd finishing on 06/19/2010. She then started tamoxifen on 06/23/2010 and it was proposed that she will continue this for 10 years, but her hormone status demonstrates a post-menopausal state and therefore, she was switched to Arimidex on 04/03/2014. She has had a L mastectomy with latissimus dorsi flap reconstruction and an R silicone implant, completed by Dr. Migdalia Dk.  She has had genetic testing done.   She denies any complaints.  She is tolerating Prolia well.  She is due for that today.  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  She enjoys this as it gets her to the beach.  She works with her friend.  She also works at [employer] on [employer] a few days per week.  Review of Systems  Constitutional: Negative for chills, fever and weight loss.  HENT: Negative.   Eyes: Negative.  Negative for blurred vision and double vision.  Respiratory: Negative.   Cardiovascular: Negative.  Negative for chest pain.  Gastrointestinal: Negative.  Negative for nausea and vomiting.  Genitourinary: Negative.   Musculoskeletal:  Negative.   Skin: Negative.   Neurological: Negative.  Negative for weakness and headaches.  Endo/Heme/Allergies: Negative.   Psychiatric/Behavioral: Negative.     Past Medical History:  Diagnosis Date  . Breast cancer ([place])    2010 ; left side mastectomy chemo pill  . Depression   . DVT (deep venous thrombosis) ([facility])    left arm  . Hypothyroidism   . Osteopenia 09/01/2014  . PTSD (post-traumatic stress disorder) 08/15/2014  . Severe major depression ([place]) 08/15/2014  . Thyroid disease     has GRAVES DISEASE; Hypothyroidism; HYPERLIPIDEMIA; ANEMIA-IRON DEFICIENCY; Depression; TREMOR; NEPHROLITHIASIS, HX OF; 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; Thyroid disease; DVT of upper extremity (deep vein thrombosis) ([place]); Severe major depression ([place]); PTSD (post-traumatic stress disorder); Osteopenia; ADD (attention deficit disorder); and Macrocytosis without anemia on her problem list.     has No Known Allergies.  We administered denosumab.  Past Surgical History:  Procedure Laterality Date  . BREAST RECONSTRUCTION  01/2011  . MASTECTOMY     left side  . PORT-A-CATH REMOVAL    . PORTACATH PLACEMENT      PHYSICAL EXAMINATION  ECOG PERFORMANCE STATUS: 0 - Asymptomatic  Vitals:   03/26/16 0921  BP: 123/78  Pulse: 78  Resp: 16  Temp: 98.1 F (36.7 C)    GENERAL:alert, no distress, well nourished, well developed, comfortable, cooperative and smiling, she is a hurry as she must leave to get to her job. SKIN: skin color, texture, turgor are normal, no rashes or significant lesions HEAD: Normocephalic, No masses, lesions, tenderness or abnormalities EYES:  normal, PERRLA, EOMI, Conjunctiva are pink and non-injected EARS: External ears normal OROPHARYNX:mucous membranes are moist  NECK: supple, trachea midline LYMPH:  not examined BREAST:Deferred today, but she is due for mammogram next month.  LUNGS: Clear to auscultation bilaterally without wheezes, rales, or  rhonchi HEART: Regular rhythm and rate without murmur, rub, or gallop. ABDOMEN:Non tender BACK: Back symmetric, no curvature. EXTREMITIES:less then 2 second capillary refill, no skin discoloration, no cyanosis  NEURO: alert & oriented x 3 with fluent speech, no focal motor/sensory deficits, gait normal   LABORATORY DATA: CBC    Component Value Date/Time   WBC 7.8 03/26/2016 0851   RBC 3.98 03/26/2016 0851   HGB 13.7 03/26/2016 0851   HCT 40.4 03/26/2016 0851   PLT 186 03/26/2016 0851   MCV 101.5 (H) 03/26/2016 0851   MCH 34.4 (H) 03/26/2016 0851   MCHC 33.9 03/26/2016 0851   RDW 13.1 03/26/2016 0851   LYMPHSABS 2.0 03/26/2016 0851   MONOABS 0.6 03/26/2016 0851   EOSABS 0.1 03/26/2016 0851   BASOSABS 0.0 03/26/2016 0851      Chemistry      Component Value Date/Time   NA 137 03/26/2016 0851   K 3.9 03/26/2016 0851   CL 102 03/26/2016 0851   CO2 28 03/26/2016 0851   BUN 13 03/26/2016 0851   CREATININE 0.81 03/26/2016 0851      Component Value Date/Time   CALCIUM 9.6 03/26/2016 0851   ALKPHOS 64 03/26/2016 0851   AST 25 03/26/2016 0851   ALT 18 03/26/2016 0851   BILITOT 0.4 03/26/2016 0851       RADIOGRAPHIC STUDIES:  No results found.   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 Both metastases greater than 3 mm but she also had extracapsular extension. No LV I was seen. ER +96%, PR +11%, Ki-67 20%, HER-2/neu nonamplified. Date of surgery was on 10/08/2009. She had dose dense epirubicin/Cytoxan for 4 cycles followed by weekly Taxol for 12 weeks followed by radiation therapy by Dr. Kyung Rudd finishing on 06/19/2010. She then started tamoxifen on 06/23/2010 and it was proposed that she will continue this for 10 years, but her hormone status demonstrates a post-menopausal state and therefore, she was  switched to Arimidex on 04/03/2014.    Labs today: CBC diff, CMET.  I personally reviewed and went over laboratory results with the patient.  The results are noted within this dictation.  I personally reviewed and went over radiographic studies with the patient.  The results are noted within this dictation.  CT CAP performed in Jan 2017 was negative for any signs of recurrence of disease to explain her past weight loss.  She is due for a mammogram in September 2017.  Order is placed.  She completed 5 years of anti-endocrine therapy in November 2016.  Return in 6 months for follow-up with labs.  Osteopenia Osteopenia in the setting of AI therapy for breast cancer.  On Ca++, Vit D, and Prolia beginning on 03/18/2015.  Oncology Flowsheet 09/04/2015  denosumab (PROLIA) [place] 60 mg   Labs today: CMET.  I personally reviewed and went over laboratory results with the patient.  The results are noted within this dictation.  Prolia injection today and again in 6 months.  Last bone density was in Jan 2016.  She will be due for another bone density exam in  Jan 2018.  Labs in 6 months: CMET.  THERAPY PLAN:  NCCN guidelines recommends the following surveillance for invasive breast cancer:  A. History and Physical exam every 4-6 months for 5 years and then every 12 months.  B. Mammography every 12 months  C. Women on Tamoxifen: annual gynecologic assessment every 12 months if uterus is present.  D. Women on aromatase inhibitor or who experience ovarian failure secondary to treatment should have monitoring of bone health with a bone mineral density determination at baseline and periodically thereafter.  E. Assess and encourage adherence to adjuvant endocrine therapy.  F. Evidence suggests that active lifestyle and achieving and maintaining an ideal body weight (20-25 BMI) may lead to optimal breast cancer outcomes.   All questions were answered. The patient knows to call the clinic with any problems,  questions or concerns. We can certainly see the patient much sooner if necessary.  Patient and plan discussed with Dr. Ancil Linsey and she is in agreement with the aforementioned.   Renee [name] 03/26/2016 5:35 PM

## 2016-03-26 NOTE — Patient Instructions (Signed)
[place] at [facility] Discharge Instructions  RECOMMENDATIONS MADE BY THE CONSULTANT AND ANY TEST RESULTS WILL BE SENT TO YOUR REFERRING PHYSICIAN.  Prolia given today. Return in 6 months for Prolia injection and follow-up appointment. Call with any questions or concerns  Thank you for choosing [place] at [facility] to provide your oncology and hematology care.  To afford each patient quality time with our provider, please arrive at least 15 minutes before your scheduled appointment time.   Beginning January 23rd 2017 lab work for the [facility] will be done in the  Main lab at [facility] on 1st floor. If you have a lab appointment with the [facility] please come in thru the  Main Entrance and check in at the main information desk  You need to re-schedule your appointment should you arrive 10 or more minutes late.  We strive to give you quality time with our providers, and arriving late affects you and other patients whose appointments are after yours.  Also, if you no show three or more times for appointments you may be dismissed from the clinic at the providers discretion.     Again, thank you for choosing [facility].  Our hope is that these requests will decrease the amount of time that you wait before being seen by our physicians.       _____________________________________________________________  Should you have questions after your visit to [facility], please contact our office at [phone] between the hours of 8:30 a.m. and 4:30 p.m.  Voicemails left after 4:30 p.m. will not be returned until the following business day.  For prescription refill requests, have your pharmacy contact our office.         Resources For Cancer Patients and their Caregivers ? American Cancer Society: Can assist with transportation, wigs, general needs, runs Look Good Feel Better.        614 463 8508 ? Cancer  Care: Provides financial assistance, online support groups, medication/co-pay assistance.  1-800-813-HOPE (715) 607-5440) ? [place] Assists [place] cancer patients and their families through emotional , educational and financial support.  435-410-4235 ? [place] DSS Where to apply for food stamps, Medicaid and utility assistance. (409) 350-1733 ? [facility]: Transportation to medical appointments. (986) 421-0335 ? Social Security Administration: May apply for disability if have a Stage IV cancer. (667) 480-2108 6284755499 ? [employer], Disability and Transit Services: Assists with nutrition, care and transit needs. [place] Support Programs: @10RELATIVEDAYS @ > Cancer Support Group  2nd Tuesday of the month 1pm-2pm, Journey Room  > Creative Journey  3rd Tuesday of the month 1130am-1pm, Journey Room  > Look Good Feel Better  1st Wednesday of the month 10am-12 noon, Journey Room (Call [facility] to register [phone])

## 2016-03-29 ENCOUNTER — Encounter: Payer: Self-pay | Admitting: Internal Medicine

## 2016-03-31 ENCOUNTER — Encounter: Payer: BLUE CROSS/BLUE SHIELD | Admitting: Internal Medicine

## 2016-03-31 ENCOUNTER — Other Ambulatory Visit: Payer: Self-pay | Admitting: Internal Medicine

## 2016-03-31 NOTE — Telephone Encounter (Signed)
Pt calling to check the status of the refill I let her know that Dr. Raliegh Ip is out of the office for the whole week and pt would like to know if there is another provider that could help her with the Rx.

## 2016-04-01 MED ORDER — AMPHETAMINE-DEXTROAMPHETAMINE 10 MG PO TABS: 10.0000 mg | ORAL_TABLET | Freq: Two times a day (BID) | ORAL | 0 refills | Status: DC

## 2016-04-01 NOTE — Telephone Encounter (Signed)
Spoke to pt, told her Dr.K is out of the office till Monday and I can not increase dose without you seeing him. Told her Dr. Yong Channel said he would write for one month supply of Adderall 10 mg twice a day. Pt verbalized understanding and said she has an appt on Sept 8th with Dr. Raliegh Ip and will discuss then. Told pt Rx will be ready today for pickup. Pt verbalized understanding. Rx printed and signed by Dr. Yong Channel.

## 2016-04-16 ENCOUNTER — Encounter: Payer: Self-pay | Admitting: Internal Medicine

## 2016-04-16 ENCOUNTER — Ambulatory Visit (INDEPENDENT_AMBULATORY_CARE_PROVIDER_SITE_OTHER): Payer: BLUE CROSS/BLUE SHIELD | Admitting: Internal Medicine

## 2016-04-16 VITALS — BP 120/70 | HR 79 | Temp 98.1°F | Resp 18 | Ht 60.75 in | Wt 111.5 lb

## 2016-04-16 DIAGNOSIS — Z0000 Encounter for general adult medical examination without abnormal findings: Secondary | ICD-10-CM | POA: Diagnosis not present

## 2016-04-16 DIAGNOSIS — Z23 Encounter for immunization: Secondary | ICD-10-CM

## 2016-04-16 LAB — LIPID PANEL
CHOLESTEROL: 254 mg/dL — AB (ref 0–200)
HDL: 70.7 mg/dL (ref >39.00)
LDL Cholesterol: 159 mg/dL — ABNORMAL HIGH (ref 0–99)
NonHDL: 183.53
TRIGLYCERIDES: 121 mg/dL (ref 0.0–149.0)
Total CHOL/HDL Ratio: 4
VLDL: 24.2 mg/dL (ref 0.0–40.0)

## 2016-04-16 LAB — TSH: TSH: 0.62 u[IU]/mL (ref 0.35–4.50)

## 2016-04-16 MED ORDER — LORAZEPAM 0.5 MG PO TABS
ORAL_TABLET | ORAL | 5 refills | Status: DC
Start: 2016-04-16 — End: 2016-11-26

## 2016-04-16 NOTE — Patient Instructions (Signed)
Take a calcium supplement, plus [phone] units of vitamin D    It is important that you exercise regularly, at least 20 minutes 3 to 4 times per week.  If you develop chest pain or shortness of breath seek  medical attention.    Oncology/orthopedic follow-up as scheduled

## 2016-04-16 NOTE — Progress Notes (Signed)
Subjective:    Patient ID: Renee [name], female    DOB: [birth year], 45 y.o.   MRN: [id number]  HPI   45 -year-old patient who is followed by oncology for left breast cancer.  She has a history of hypothyroidism following treatment for Graves' disease.  She has major depression. She continues to do well on low-dose Adderall for ADHD. She no longer is being followed by behavioral health.  Her depression is about the same. She is seen today for an annual exam She has a history of osteopenia and is receiving Prolia every 6 months from hematology  She has been followed by orthopedics due to left knee pain and is contemplating surgery, apparently due to a unstable patella   Past Medical History:  Diagnosis Date  . Breast cancer ([place])    2010 ; left side mastectomy chemo pill  . Depression   . DVT (deep venous thrombosis) ([facility])    left arm  . Hypothyroidism   . Osteopenia 09/01/2014  . PTSD (post-traumatic stress disorder) 08/15/2014  . Severe major depression ([place]) 08/15/2014  . Thyroid disease     Social History   Social History  . Marital status: Divorced    Spouse name: N/A  . Number of children: N/A  . Years of education: N/A   Occupational History  . Not on file.   Social History Main Topics  . Smoking status: Former Smoker    Packs/day: 0.50    Years: 14.00    Quit date: 09/22/2010  . Smokeless tobacco: Never Used  . Alcohol use No  . Drug use: No  . Sexual activity: Not on file   Other Topics Concern  . Not on file   Social History Narrative  . No narrative on file    Past Surgical History:  Procedure Laterality Date  . BREAST RECONSTRUCTION  01/2011  . MASTECTOMY     left side  . PORT-A-CATH REMOVAL    . PORTACATH PLACEMENT      Family History  Problem Relation Age of Onset  . Hypertension Mother   . Diabetes Father   . Stroke Father   . Hypertension Father   . Hypertension Maternal Grandmother   . Diabetes Paternal Grandmother   . Cancer  Paternal Grandfather     No Known Allergies  Current Outpatient Prescriptions on File Prior to Visit  Medication Sig Dispense Refill  . amphetamine-dextroamphetamine (ADDERALL) 10 MG tablet Take 1 tablet (10 mg total) by mouth 2 (two) times daily. 60 tablet 0  . anastrozole (ARIMIDEX) 1 MG tablet TAKE 1 TABLET EVERY DAY 30 tablet 5  . calcium-vitamin D (OSCAL WITH D) 500-200 MG-UNIT per tablet Take 1 tablet by mouth 2 (two) times daily. 60 tablet 11  . levothyroxine (SYNTHROID, LEVOTHROID) 100 MCG tablet TAKE 1 TABLET EVERY DAY 90 tablet 0  . LORazepam (ATIVAN) 0.5 MG tablet TAKE TWO TABLETS BY MOUTH EVERY 8 HOURS AS NEEDED FOR ANXIETY 60 tablet 5   No current facility-administered medications on file prior to visit.     BP 120/70 (BP Location: Right Arm, Patient Position: Sitting, Cuff Size: Normal)   Pulse 79   Temp 98.1 F (36.7 C) (Oral)   Resp 18   Ht 5' 0.75" (1.543 m)   Wt 111 lb 8 oz (50.6 kg)   SpO2 98%   BMI 21.24 kg/m      Review of Systems  Constitutional: Negative.   HENT: Negative for congestion, dental problem, hearing loss, rhinorrhea,  sinus pressure, sore throat and tinnitus.   Eyes: Negative for pain, discharge and visual disturbance.  Respiratory: Negative for cough and shortness of breath.   Cardiovascular: Negative for chest pain, palpitations and leg swelling.  Gastrointestinal: Negative for abdominal distention, abdominal pain, blood in stool, constipation, diarrhea, nausea and vomiting.  Genitourinary: Negative for difficulty urinating, dysuria, flank pain, frequency, hematuria, pelvic pain, urgency, vaginal bleeding, vaginal discharge and vaginal pain.  Musculoskeletal: Negative for arthralgias, gait problem and joint swelling.  Skin: Negative for rash.  Neurological: Negative for dizziness, syncope, speech difficulty, weakness, numbness and headaches.  Hematological: Negative for adenopathy.  Psychiatric/Behavioral: Positive for dysphoric mood.  Negative for agitation and behavioral problems. The patient is not nervous/anxious.        Objective:   Physical Exam  Constitutional: She is oriented to person, place, and time. She appears well-developed and well-nourished.  HENT:  Head: Normocephalic.  Right Ear: External ear normal.  Left Ear: External ear normal.  Mouth/Throat: Oropharynx is clear and moist.  Eyes: Conjunctivae and EOM are normal. Pupils are equal, round, and reactive to light.  Neck: Normal range of motion. Neck supple. No thyromegaly present.  Cardiovascular: Normal rate, regular rhythm, normal heart sounds and intact distal pulses.   Pulmonary/Chest: Effort normal and breath sounds normal.  Abdominal: Soft. Bowel sounds are normal. She exhibits no mass. There is no tenderness.  Musculoskeletal: Normal range of motion.  Lymphadenopathy:    She has no cervical adenopathy.  Neurological: She is alert and oriented to person, place, and time.  Skin: Skin is warm and dry. No rash noted.  Psychiatric: She has a normal mood and affect. Her behavior is normal.          Assessment & Plan:   Preventive health exam  Hypothyroidism status post I-131.  Will check a TSH.  Continue supplementation Major depression.  No change in therapy.  Behavioral health follow-up.  Strongly encouraged History of breast cancer.  Follow-up oncology Osteopenia.  Continue Prolia every 6 months.  Continue calcium and vitamin D supplementation  Follow-up oncology Return here one year or as needed  [name], MD

## 2016-04-16 NOTE — Progress Notes (Signed)
Pre visit review using our clinic review tool, if applicable. No additional management support is needed unless otherwise documented below in the visit note. 

## 2016-04-20 ENCOUNTER — Encounter (HOSPITAL_BASED_OUTPATIENT_CLINIC_OR_DEPARTMENT_OTHER): Payer: Self-pay | Admitting: Physician Assistant

## 2016-04-20 DIAGNOSIS — S83242A Other tear of medial meniscus, current injury, left knee, initial encounter: Secondary | ICD-10-CM

## 2016-04-20 DIAGNOSIS — S83422A Sprain of lateral collateral ligament of left knee, initial encounter: Secondary | ICD-10-CM | POA: Diagnosis present

## 2016-04-20 DIAGNOSIS — S83522A Sprain of posterior cruciate ligament of left knee, initial encounter: Secondary | ICD-10-CM

## 2016-04-20 HISTORY — DX: Sprain of posterior cruciate ligament of left knee, initial encounter: S83.522A

## 2016-04-20 HISTORY — DX: Other tear of medial meniscus, current injury, left knee, initial encounter: S83.242A

## 2016-04-20 NOTE — H&P (Signed)
Renee [name] is an 45 y.o. female.   Chief Complaint: left knee pain HPI: Patient is a 45 year-old female who had presented with pain starting on July 4th.  It was rather significant.  We elected to obtain an MRI.  Again, she denies any trauma or injury at the onset of these symptoms.  MRI showing extensive tears of the biceps femoris and popliteus muscles.  Remote lateral collateral ligament avulsion from the femoral attachment.  Small peripheral inferior articular surface tear involving the posterior horn mid body junction of the medial meniscus.  Medial knee pain.  Does not correlate clinically.  She is having a lot of lateral knee pain.  Difficulty with range of motion.  Here for orthopedic recommendations today.    Past Medical History:  Diagnosis Date  . Acute medial meniscus tear of left knee 04/20/2016  . Breast cancer ([place])    2010 ; left side mastectomy chemo pill  . Depression   . DVT (deep venous thrombosis) ([facility])    left arm  . Hypothyroidism   . Osteopenia 09/01/2014  . PTSD (post-traumatic stress disorder) 08/15/2014  . Rupture of posterior cruciate ligament of left knee 04/20/2016  . Severe major depression ([place]) 08/15/2014  . Thyroid disease     Past Surgical History:  Procedure Laterality Date  . BREAST RECONSTRUCTION  01/2011  . MASTECTOMY     left side  . PORT-A-CATH REMOVAL    . PORTACATH PLACEMENT      Family History  Problem Relation Age of Onset  . Hypertension Mother   . Diabetes Father   . Stroke Father   . Hypertension Father   . Hypertension Maternal Grandmother   . Diabetes Paternal Grandmother   . Cancer Paternal Grandfather    Social History:  reports that she quit smoking about 5 years ago. She has a 7.00 pack-year smoking history. She has never used smokeless tobacco. She reports that she does not drink alcohol or use drugs.  Allergies: No Known Allergies  No current facility-administered medications for this encounter.   Current Outpatient  Prescriptions:  .  amphetamine-dextroamphetamine (ADDERALL) 10 MG tablet, Take 1 tablet (10 mg total) by mouth 2 (two) times daily., Disp: 60 tablet, Rfl: 0 .  anastrozole (ARIMIDEX) 1 MG tablet, TAKE 1 TABLET EVERY DAY, Disp: 30 tablet, Rfl: 5 .  calcium-vitamin D (OSCAL WITH D) 500-200 MG-UNIT per tablet, Take 1 tablet by mouth 2 (two) times daily., Disp: 60 tablet, Rfl: 11 .  levothyroxine (SYNTHROID, LEVOTHROID) 100 MCG tablet, TAKE 1 TABLET EVERY DAY, Disp: 90 tablet, Rfl: 0 .  LORazepam (ATIVAN) 0.5 MG tablet, TAKE TWO TABLETS BY MOUTH EVERY 8 HOURS AS NEEDED FOR ANXIETY, Disp: 60 tablet, Rfl: 5  No results found for this or any previous visit (from the past 48 hour(s)). No results found.  Review of Systems  Constitutional: Negative.   HENT: Negative.   Eyes: Negative.   Respiratory: Negative.   Cardiovascular: Negative.   Gastrointestinal: Negative.   Genitourinary: Negative.   Musculoskeletal: Positive for joint pain.  Skin: Negative.   Neurological: Negative.   Endo/Heme/Allergies: Negative.   Psychiatric/Behavioral: Negative.     There were no vitals taken for this visit. Physical Exam  Constitutional: She appears well-developed and well-nourished.  HENT:  Head: Normocephalic and atraumatic.  Eyes: Conjunctivae are normal. Pupils are equal, round, and reactive to light.  Neck: Neck supple.  Cardiovascular: Normal rate.   Respiratory: Effort normal.  GI: Soft.  Genitourinary:  Genitourinary  Comments: Not pertinent to current symptomatology therefore not examined.  Musculoskeletal:  Specifically, this is a very fit appearing 45 year-old female.  Walking without a brace is very unsteady with a varus thrust which is painful.  She has obvious LCL deficiency and it feels like her posterolateral corner is moderately loose.  Cruciates are intact.  MCL intact.  Tender medial joint line.  Positive McMurray's.  Neurovascularly intact distally.  No acute swelling.     Neurological: She is alert.  Skin: Skin is dry.  Psychiatric: She has a normal mood and affect.     Assessment Principal Problem:   Rupture of posterior cruciate ligament of left knee Active Problems:   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   DVT of upper extremity (deep vein thrombosis) (HCC)   Severe major depression (HCC)   PTSD (post-traumatic stress disorder)   ADD (attention deficit disorder)   Tear of lateral collateral ligament of left knee   Acute medial meniscus tear of left knee    Plan I spent more than 30 minutes with her face-to-face going over her findings and treatment.  The knee needs to be stabilized for her to be active and she thoroughly understands that.  We have discussed exam under anesthesia, arthroscopy, looking for and caring for intraarticular pathology, including meniscus tear.  I will then do an open approach laterally.  This will at least be a reconstruction of the LCL with allograft.  Could very well also include a posterolateral reconstruction with allograft as well.  Both of those techniques, what is involved intra and post-op reviewed.  Emphasizing this can be done outpatient with a nerve block, general anesthesia and overnight observation.  It is a full six months before complete recovery and sometimes up to a year.  She really has no choice at this point in time.  I have answered all questions.  Completed all paperwork.  I will see her at the time of operative intervention.  Continue in her brace in the interim.    Linda Hedges, PA-C 04/20/2016, 9:49 AM

## 2016-04-22 ENCOUNTER — Encounter (HOSPITAL_BASED_OUTPATIENT_CLINIC_OR_DEPARTMENT_OTHER): Payer: Self-pay | Admitting: *Deleted

## 2016-04-25 NOTE — H&P (Signed)
ORTHOPAEDIC HISTORY & PHYSICAL  Renee [name] MRN:  [id number] DOB/SEX:  [birth year]/female  CHIEF COMPLAINT:  Painful left knee  HISTORY: Patient is a 45 y.o. female presented with a history of pain in the left knee for 3 months. Onset of symptoms was abrupt starting July 4th of this year year ago with unchanged course since that time. Prior procedures on the knee are none. Patient has been treated conservatively with over-the-counter NSAIDs and activity modification. Patient currently rates pain at 7 out of 10 with activity. There is no pain at night. present.  They have been previously treated with: NSAIDS: bracing with no improvement    PAST MEDICAL HISTORY: Patient Active Problem List   Diagnosis Date Noted  . Rupture of posterior cruciate ligament of left knee 04/20/2016  . Tear of lateral collateral ligament of left knee 04/20/2016  . Acute medial meniscus tear of left knee 04/20/2016  . Macrocytosis without anemia 09/27/2015  . ADD (attention deficit disorder) 11/07/2014  . Osteopenia 09/01/2014  . Severe major depression ([place]) 08/15/2014  . PTSD (post-traumatic stress disorder) 08/15/2014  . DVT of upper extremity (deep vein thrombosis) ([place]) 09/12/2012  . 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 Thyroid disease   . Hypothyroidism 02/24/2009  . [place] DISEASE 04/26/2008  . HYPERLIPIDEMIA 04/01/2008  . ANEMIA-IRON DEFICIENCY 04/01/2008  . Depression 04/01/2008  . TREMOR 04/01/2008  . NEPHROLITHIASIS, HX OF 04/01/2008   Past Medical History:  Diagnosis Date  . Acute medial meniscus tear of left knee 04/20/2016  . Breast cancer ([place])    2010 ; left side mastectomy chemo pill  . Depression   . DVT (deep venous thrombosis) ([facility])    left arm  . Hypothyroidism   . Osteopenia 09/01/2014  . PTSD (post-traumatic stress disorder) 08/15/2014  . Rupture of posterior cruciate ligament of left knee 04/20/2016  . Severe major depression ([place]) 08/15/2014  . Thyroid disease     Past Surgical History:  Procedure Laterality Date  . BREAST CAPSULECTOMY WITH IMPLANT EXCHANGE Left 08/19/2011  . BREAST RECONSTRUCTION Left 01/15/2011  . MINOR IRRIGATION AND DEBRIDEMENT OF WOUND Left 03/05/2011   back and left breast  . MODIFIED RADICAL MASTECTOMY Left 10/08/2009  . PLACEMENT OF BREAST IMPLANTS Right 08/19/2011  . PORT-A-CATH REMOVAL  07/15/2010  . PORTACATH PLACEMENT  11/14/2009   with left breast wound revision     MEDICATIONS:   No prescriptions prior to admission.    ALLERGIES:  No Known Allergies  REVIEW OF SYSTEMS:  Pertinent items are noted in HPI.   FAMILY HISTORY:   Family History  Problem Relation Age of Onset  . Hypertension Mother   . Diabetes Father   . Stroke Father   . Hypertension Father   . Hypertension Maternal Grandmother   . Diabetes Paternal Grandmother   . Cancer Paternal Grandfather     SOCIAL HISTORY:   Social History  Substance Use Topics  . Smoking status: Former Smoker    Packs/day: 0.50    Years: 14.00    Quit date: 09/22/2010  . Smokeless tobacco: Never Used  . Alcohol use No      EXAMINATION: Vital signs in last 24 hours:    Head is normocephalic.   Eyes:  Pupils equal, round and reactive to light and accommodation.  Extraocular intact. ENT: Ears, nose, and throat were benign.   Neck: supple, no bruits were noted.   Chest: good expansion.   Lungs: essentially clear.   Cardiac: regular rhythm  and rate, normal S1, S2.  No murmurs appreciated. Pulses :  2+ bilateral and symmetric in lower extremities. Abdomen is scaphoid, soft, nontender, no masses palpable, normal bowel sounds                  present. CNS:  He is oriented x3 and cranial nerves II-XII grossly intact. Breast, rectal, and genital exams: not performed and not indicated for an orthopedic evaluation. Musculoskeletal:   There were no vitals taken for this visit.  General Appearance:    Alert, cooperative, no distress, appears stated age   Head:    Normocephalic, without obvious abnormality, atraumatic  Eyes:    PERRL, conjunctiva/corneas clear, EOM's intact, fundi    benign, both eyes  Ears:    Normal TM's and external ear canals, both ears  Nose:   Nares normal, septum midline, mucosa normal, no drainage    or sinus tenderness  Throat:   Lips, mucosa, and tongue normal; teeth and gums normal  Neck:   Supple, symmetrical, trachea midline, no adenopathy;    thyroid:  no enlargement/tenderness/nodules; no carotid   bruit or JVD  Back:     Symmetric, no curvature, ROM normal, no CVA tenderness  Lungs:     Clear to auscultation bilaterally, respirations unlabored  Chest Wall:    No tenderness or deformity   Heart:    Regular rate and rhythm, S1 and S2 normal, no murmur, rub   or gallop  Breast Exam:    No tenderness, masses, or nipple abnormality  Abdomen:     Soft, non-tender, bowel sounds active all four quadrants,    no masses, no organomegaly  Genitalia:    Normal female without lesion, discharge or tenderness  Rectal:    Normal tone, normal prostate, no masses or tenderness;   guaiac negative stool  Extremities:   Extremities normal, atraumatic, no cyanosis or edema  Pulses:   2+ and symmetric all extremities  Skin:   Skin color, texture, turgor normal, no rashes or lesions  Lymph nodes:   Cervical, supraclavicular, and axillary nodes normal  Neurologic:   CNII-XII intact, normal strength, sensation and reflexes    throughout     Musculoskeletal:  ROM full, Walking without a brace is very unsteady with a varus thrust which is painful.  She has obvious LCL deficiency and it feels like her posterolateral corner is moderately loose.  Cruciates are intact.  MCL intact.  Tender medial joint line.  Positive McMurray's.  Neurovascularly intact distally.  No acute swelling. , Ligaments as described,    Imaging Review  MRI did show lateral collateral ligament avulsion from the femoral attachment; also some muscle injury  with extensive tear biceps femoris and popliteus, small medial meniscal tear ASSESSMENT: Past Medical History:  Diagnosis Date  . Acute medial meniscus tear of left knee 04/20/2016  . Breast cancer ([place])    2010 ; left side mastectomy chemo pill  . Depression   . DVT (deep venous thrombosis) (H[facility])    left arm  . Hypothyroidism   . Osteopenia 09/01/2014  . PTSD (post-traumatic stress disorder) 08/15/2014  . Rupture of posterior cruciate ligament of left knee 04/20/2016  . Severe major depression ([place]) 08/15/2014  . Thyroid disease     PLAN: Plan for left  LEFT KNEE ARTHROSCOPY WITH CHONDROPLASTY, MEDIAL MENISCECTOMY, LATERAL COLLATERAL LIGAMENT RECONSTRUCTION, POSTERIOR LATERAL CORNER RECONSTRUCTION. ALLOGRAFT X2 WITH GUIDES, ARTHREX    KNEE ARTHROSCOPY WITH MEDIAL COLLATERAL LIGAMENT RECONSTRUCTION  The procedure,  risks, and benefits of total knee arthroplasty were presented and reviewed. The risks including but not limited to infection, blood clots, vascular and nerve injury, stiffness,  among others were discussed. The patient acknowledged the explanation, agreed to proceed.   Chanique Duca B 04/25/2016, 9:01 PM

## 2016-04-27 ENCOUNTER — Other Ambulatory Visit (HOSPITAL_COMMUNITY): Payer: Self-pay | Admitting: Oncology

## 2016-04-28 ENCOUNTER — Other Ambulatory Visit (HOSPITAL_COMMUNITY): Payer: Self-pay | Admitting: Oncology

## 2016-04-28 DIAGNOSIS — R229 Localized swelling, mass and lump, unspecified: Secondary | ICD-10-CM

## 2016-04-29 ENCOUNTER — Encounter (HOSPITAL_BASED_OUTPATIENT_CLINIC_OR_DEPARTMENT_OTHER): Admission: RE | Payer: Self-pay | Source: Ambulatory Visit

## 2016-04-29 ENCOUNTER — Ambulatory Visit (HOSPITAL_BASED_OUTPATIENT_CLINIC_OR_DEPARTMENT_OTHER)
Admission: RE | Admit: 2016-04-29 | Payer: BLUE CROSS/BLUE SHIELD | Source: Ambulatory Visit | Admitting: Orthopedic Surgery

## 2016-04-29 HISTORY — DX: Sprain of posterior cruciate ligament of left knee, initial encounter: S83.522A

## 2016-04-29 HISTORY — DX: Other tear of medial meniscus, current injury, left knee, initial encounter: S83.242A

## 2016-04-29 SURGERY — ARTHROSCOPY, KNEE, WITH MEDIAL MENISCECTOMY
Anesthesia: General | Laterality: Left

## 2016-04-30 ENCOUNTER — Telehealth: Payer: Self-pay | Admitting: Internal Medicine

## 2016-04-30 NOTE — Telephone Encounter (Signed)
Pt needs new rx generic adderall 10 mg. Pt is aware md out of office today

## 2016-05-03 MED ORDER — AMPHETAMINE-DEXTROAMPHETAMINE 10 MG PO TABS: 10.0000 mg | ORAL_TABLET | Freq: Two times a day (BID) | ORAL | 0 refills | Status: DC

## 2016-05-03 NOTE — Telephone Encounter (Signed)
Left message on personal voicemail Rx's ready for pickup will be at the front desk. Rx's printed and signed.

## 2016-05-04 ENCOUNTER — Other Ambulatory Visit (HOSPITAL_COMMUNITY): Payer: Self-pay | Admitting: Oncology

## 2016-05-04 ENCOUNTER — Ambulatory Visit (HOSPITAL_COMMUNITY)
Admission: RE | Admit: 2016-05-04 | Discharge: 2016-05-04 | Disposition: A | Discharge status: Home / Self Care | Payer: BLUE CROSS/BLUE SHIELD | Source: Ambulatory Visit | Attending: Oncology | Admitting: Oncology

## 2016-05-04 DIAGNOSIS — R229 Localized swelling, mass and lump, unspecified: Secondary | ICD-10-CM | POA: Diagnosis present

## 2016-05-04 DIAGNOSIS — Z1231 Encounter for screening mammogram for malignant neoplasm of breast: Secondary | ICD-10-CM

## 2016-05-10 ENCOUNTER — Ambulatory Visit (HOSPITAL_COMMUNITY): Payer: BLUE CROSS/BLUE SHIELD

## 2016-06-04 ENCOUNTER — Other Ambulatory Visit: Payer: Self-pay | Admitting: Internal Medicine

## 2016-06-18 ENCOUNTER — Other Ambulatory Visit (HOSPITAL_COMMUNITY): Payer: Self-pay | Admitting: Oncology

## 2016-06-18 ENCOUNTER — Ambulatory Visit (HOSPITAL_COMMUNITY)
Admission: RE | Admit: 2016-06-18 | Discharge: 2016-06-18 | Disposition: A | Discharge status: Home / Self Care | Payer: BLUE CROSS/BLUE SHIELD | Source: Ambulatory Visit | Attending: Oncology | Admitting: Oncology

## 2016-06-18 DIAGNOSIS — Z1231 Encounter for screening mammogram for malignant neoplasm of breast: Secondary | ICD-10-CM

## 2016-06-20 ENCOUNTER — Other Ambulatory Visit: Payer: Self-pay | Admitting: Internal Medicine

## 2016-07-12 DIAGNOSIS — G40909 Epilepsy, unspecified, not intractable, without status epilepticus: Secondary | ICD-10-CM | POA: Insufficient documentation

## 2016-08-03 ENCOUNTER — Other Ambulatory Visit: Payer: Self-pay | Admitting: Internal Medicine

## 2016-08-03 MED ORDER — AMPHETAMINE-DEXTROAMPHETAMINE 10 MG PO TABS: 10.0000 mg | ORAL_TABLET | Freq: Two times a day (BID) | ORAL | 0 refills | Status: DC

## 2016-08-03 NOTE — Telephone Encounter (Signed)
Pt notified Rx ready for pickup. Rx printed and signed.  

## 2016-08-03 NOTE — Telephone Encounter (Signed)
Pt notified via phone that Rx ready for pickup. Rx printed and signed.

## 2016-08-03 NOTE — Telephone Encounter (Signed)
Pt is out °

## 2016-09-23 ENCOUNTER — Other Ambulatory Visit (HOSPITAL_COMMUNITY): Payer: Self-pay | Admitting: *Deleted

## 2016-09-23 DIAGNOSIS — C50919 Malignant neoplasm of unspecified site of unspecified female breast: Secondary | ICD-10-CM

## 2016-09-27 ENCOUNTER — Encounter (HOSPITAL_COMMUNITY): Payer: BLUE CROSS/BLUE SHIELD

## 2016-09-27 ENCOUNTER — Encounter (HOSPITAL_BASED_OUTPATIENT_CLINIC_OR_DEPARTMENT_OTHER): Payer: BLUE CROSS/BLUE SHIELD

## 2016-09-27 ENCOUNTER — Encounter (HOSPITAL_COMMUNITY): Payer: BLUE CROSS/BLUE SHIELD | Attending: Oncology | Admitting: Oncology

## 2016-09-27 ENCOUNTER — Encounter (HOSPITAL_COMMUNITY): Payer: Self-pay | Admitting: Oncology

## 2016-09-27 VITALS — BP 123/78 | HR 82 | Temp 98.2°F | Resp 16 | Ht 60.0 in | Wt 111.0 lb

## 2016-09-27 DIAGNOSIS — Z923 Personal history of irradiation: Secondary | ICD-10-CM | POA: Diagnosis not present

## 2016-09-27 DIAGNOSIS — E0500 Thyrotoxicosis with diffuse goiter without thyrotoxic crisis or storm: Secondary | ICD-10-CM | POA: Diagnosis not present

## 2016-09-27 DIAGNOSIS — C50912 Malignant neoplasm of unspecified site of left female breast: Secondary | ICD-10-CM | POA: Insufficient documentation

## 2016-09-27 DIAGNOSIS — Z9012 Acquired absence of left breast and nipple: Secondary | ICD-10-CM | POA: Diagnosis not present

## 2016-09-27 DIAGNOSIS — Z9889 Other specified postprocedural states: Secondary | ICD-10-CM | POA: Insufficient documentation

## 2016-09-27 DIAGNOSIS — C773 Secondary and unspecified malignant neoplasm of axilla and upper limb lymph nodes: Secondary | ICD-10-CM

## 2016-09-27 DIAGNOSIS — Z86718 Personal history of other venous thrombosis and embolism: Secondary | ICD-10-CM | POA: Diagnosis not present

## 2016-09-27 DIAGNOSIS — E785 Hyperlipidemia, unspecified: Secondary | ICD-10-CM | POA: Insufficient documentation

## 2016-09-27 DIAGNOSIS — M85852 Other specified disorders of bone density and structure, left thigh: Secondary | ICD-10-CM

## 2016-09-27 DIAGNOSIS — Z87442 Personal history of urinary calculi: Secondary | ICD-10-CM | POA: Insufficient documentation

## 2016-09-27 DIAGNOSIS — F329 Major depressive disorder, single episode, unspecified: Secondary | ICD-10-CM | POA: Diagnosis not present

## 2016-09-27 DIAGNOSIS — E039 Hypothyroidism, unspecified: Secondary | ICD-10-CM | POA: Insufficient documentation

## 2016-09-27 DIAGNOSIS — Z170 Estrogen receptor positive status [ER+]: Secondary | ICD-10-CM | POA: Diagnosis not present

## 2016-09-27 DIAGNOSIS — F4310 Post-traumatic stress disorder, unspecified: Secondary | ICD-10-CM | POA: Diagnosis not present

## 2016-09-27 DIAGNOSIS — M8580 Other specified disorders of bone density and structure, unspecified site: Secondary | ICD-10-CM | POA: Diagnosis not present

## 2016-09-27 DIAGNOSIS — Z79811 Long term (current) use of aromatase inhibitors: Secondary | ICD-10-CM

## 2016-09-27 DIAGNOSIS — C50919 Malignant neoplasm of unspecified site of unspecified female breast: Secondary | ICD-10-CM

## 2016-09-27 LAB — CBC WITH DIFFERENTIAL/PLATELET
BASOS PCT: 0 %
Basophils Absolute: 0 10*3/uL (ref 0.0–0.1)
EOS ABS: 0.1 10*3/uL (ref 0.0–0.7)
Eosinophils Relative: 2 %
HEMATOCRIT: 39.3 % (ref 36.0–46.0)
Hemoglobin: 13.1 g/dL (ref 12.0–15.0)
LYMPHS ABS: 1.7 10*3/uL (ref 0.7–4.0)
Lymphocytes Relative: 25 %
MCH: 33.5 pg (ref 26.0–34.0)
MCHC: 33.3 g/dL (ref 30.0–36.0)
MCV: 100.5 fL — ABNORMAL HIGH (ref 78.0–100.0)
MONOS PCT: 8 %
Monocytes Absolute: 0.5 10*3/uL (ref 0.1–1.0)
NEUTROS ABS: 4.5 10*3/uL (ref 1.7–7.7)
Neutrophils Relative %: 65 %
Platelets: 163 10*3/uL (ref 150–400)
RBC: 3.91 MIL/uL (ref 3.87–5.11)
RDW: 11.9 % (ref 11.5–15.5)
WBC: 6.9 10*3/uL (ref 4.0–10.5)

## 2016-09-27 LAB — COMPREHENSIVE METABOLIC PANEL
ALBUMIN: 3.8 g/dL (ref 3.5–5.0)
ALT: 16 U/L (ref 14–54)
ANION GAP: 9 (ref 5–15)
AST: 26 U/L (ref 15–41)
Alkaline Phosphatase: 69 U/L (ref 38–126)
BILIRUBIN TOTAL: 0.3 mg/dL (ref 0.3–1.2)
BUN: 16 mg/dL (ref 6–20)
CALCIUM: 9.2 mg/dL (ref 8.9–10.3)
CO2: 28 mmol/L (ref 22–32)
Chloride: 99 mmol/L — ABNORMAL LOW (ref 101–111)
Creatinine, Ser: 0.77 mg/dL (ref 0.44–1.00)
GFR calc non Af Amer: >60 mL/min (ref >60)
GLUCOSE: 97 mg/dL (ref 65–99)
Potassium: 3.2 mmol/L — ABNORMAL LOW (ref 3.5–5.1)
Sodium: 136 mmol/L (ref 135–145)
TOTAL PROTEIN: 6.6 g/dL (ref 6.5–8.1)

## 2016-09-27 MED ORDER — DENOSUMAB 60 MG/ML ~~LOC~~ SOLN
60.0000 mg | Freq: Once | SUBCUTANEOUS | Status: AC
  Administered 2016-09-27: 60 mg via SUBCUTANEOUS
  Filled 2016-09-27: qty 1

## 2016-09-27 NOTE — Patient Instructions (Signed)
[place] at [facility] Discharge Instructions  RECOMMENDATIONS MADE BY THE CONSULTANT AND ANY TEST RESULTS WILL BE SENT TO YOUR REFERRING PHYSICIAN.  You were seen today by Dr. Barron Schmid prolia injection today Lab work today, we will call you with results Bone density with in 6 months before next prolia Follow up in 6 months with labs and injection. See Amy up front for appointments   Thank you for choosing [place] at [facility] to provide your oncology and hematology care.  To afford each patient quality time with our provider, please arrive at least 15 minutes before your scheduled appointment time.    If you have a lab appointment with the [facility] please come in thru the  Main Entrance and check in at the main information desk  You need to re-schedule your appointment should you arrive 10 or more minutes late.  We strive to give you quality time with our providers, and arriving late affects you and other patients whose appointments are after yours.  Also, if you no show three or more times for appointments you may be dismissed from the clinic at the providers discretion.     Again, thank you for choosing [facility].  Our hope is that these requests will decrease the amount of time that you wait before being seen by our physicians.       _____________________________________________________________  Should you have questions after your visit to [facility], please contact our office at [phone] between the hours of 8:30 a.m. and 4:30 p.m.  Voicemails left after 4:30 p.m. will not be returned until the following business day.  For prescription refill requests, have your pharmacy contact our office.       Resources For Cancer Patients and their Caregivers ? American Cancer Society: Can assist with transportation, wigs, general needs, runs Look Good Feel Better.        516-642-4348 ? Cancer  Care: Provides financial assistance, online support groups, medication/co-pay assistance.  1-800-813-HOPE 410-239-9314) ? [place] Assists [place] cancer patients and their families through emotional , educational and financial support.  4083949756 ? [place] DSS Where to apply for food stamps, Medicaid and utility assistance. 760-813-7822 ? RCATS: Transportation to medical appointments. 757-229-4526 ? Social Security Administration: May apply for disability if have a Stage IV cancer. ([phone] 973-262-0487 ? [employer], Disability and Transit Services: Assists with nutrition, care and transit needs. [place] Support Programs: @10RELATIVEDAYS @ > Cancer Support Group  2nd Tuesday of the month 1pm-2pm, Journey Room  > Creative Journey  3rd Tuesday of the month 1130am-1pm, Journey Room  > Look Good Feel Better  1st Wednesday of the month 10am-12 noon, Journey Room (Call [place] to register [phone])

## 2016-09-27 NOTE — Progress Notes (Signed)
[name] tolerated Prolia injection well without complaints or incident. Labs reviewed prior to administering Prolia.Pt denied any tooth or jaw pain prior to administering injection as well. Pt discharged self ambulatory in satisfactory condition

## 2016-09-27 NOTE — Progress Notes (Signed)
Renee Cowden, MD [place] [address]  Osteopenia of left thigh - Plan: CBC with Differential, Comprehensive metabolic panel  CURRENT THERAPY: Arimidex daily beginning on 04/03/2014 after switching from Tamoxifen. Prolia for osteopenia every 6 months.  INTERVAL HISTORY: Renee [name] 46 y.o. 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 Both metastases greater than 3 mm but she also had extracapsular extension. No LV I was seen. ER +96%, PR +11%, Ki-67 20%, HER-2/neu nonamplified. Date of surgery was on 10/08/2009. She had dose dense epirubicin/Cytoxan for 4 cycles followed by weekly Taxol for 12 weeks followed by radiation therapy by Dr. Kyung Rudd finishing on 06/19/2010. She then started tamoxifen on 06/23/2010 and it was proposed that she will continue this for 10 years, but her hormone status demonstrates a post-menopausal state and therefore, she was switched to Arimidex on 04/03/2014. She has had a L mastectomy with latissimus dorsi flap reconstruction and an R silicone implant, completed by Dr. Migdalia Dk.  She has had genetic testing done.  AND Osteopenia on bone density exam on 08/26/2014 in the setting of AI therapy, on Ca++, Vit D, and Prolia every 6 months beginning on 03/18/2015.  Oncologically, the patient is doing very well.  She denies any new breast complaints.  She continues to perform her own breast exams and she denies any new abnormalities.  Her appetite is good and her weight is stable.  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  She enjoys this as it gets her to the beach.  She works with her friend.  She also works at [employer] on [employer] a few days per week.  She is now going through a divorce.  She notes a knee injury with ACL tear requiring surgical intervention.   She has seen orthopedics at [facility] and she will continue to follow-up with them for management of this issue.  Review of Systems  Constitutional: Negative.  Negative for chills, fever and weight loss.  HENT: Negative.   Eyes: Negative.   Respiratory: Negative.  Negative for cough.   Cardiovascular: Negative.  Negative for chest pain.  Gastrointestinal: Negative.  Negative for blood in stool, constipation, diarrhea, melena, nausea and vomiting.  Genitourinary: Negative.   Musculoskeletal: Negative.   Skin: Negative.   Neurological: Negative.  Negative for weakness.  Endo/Heme/Allergies: Negative.   Psychiatric/Behavioral: Negative.     Past Medical History:  Diagnosis Date  . Acute medial meniscus tear of left knee 04/20/2016  . Breast cancer ([place])    2010 ; left side mastectomy chemo pill  . Depression   . DVT (deep venous thrombosis) ([facility])    left arm  . Hypothyroidism   . Osteopenia 09/01/2014  . PTSD (post-traumatic stress disorder) 08/15/2014  . Rupture of posterior cruciate ligament of left knee 04/20/2016  . Severe major depression ([place]) 08/15/2014  . 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  has No Known Allergies.  [name] had no medications administered during this visit.  Past Surgical History:  Procedure Laterality Date  . BREAST CAPSULECTOMY WITH IMPLANT EXCHANGE Left 08/19/2011  . BREAST RECONSTRUCTION Left 01/15/2011  . MINOR  IRRIGATION AND DEBRIDEMENT OF WOUND Left 03/05/2011   back and left breast  . MODIFIED RADICAL MASTECTOMY Left 10/08/2009  . PLACEMENT OF BREAST IMPLANTS Right 08/19/2011  . PORT-A-CATH REMOVAL  07/15/2010  . PORTACATH PLACEMENT  11/14/2009   with left breast wound revision    PHYSICAL EXAMINATION  ECOG PERFORMANCE STATUS: 0 - Asymptomatic  Vitals:   09/27/16 1432  BP: 123/78  Pulse: 82  Resp: 16  Temp: 98.2 F (36.8 C)    GENERAL:alert, no distress, well nourished, well developed, comfortable, cooperative and smiling, and unaccompanied SKIN: skin color, texture, turgor are normal, no rashes or significant lesions HEAD: Normocephalic, No masses, lesions, tenderness or abnormalities EYES: normal, PERRLA, EOMI, Conjunctiva are pink and non-injected EARS: External ears normal OROPHARYNX:mucous membranes are moist  NECK: supple, trachea midline LYMPH:  not examined BREAST: Breasts without any palpable abnormalities or skin changes.  Status post reconstruction.  Small stitch noted in the medial aspect of her left breast surgical scar, palpable, tender. LUNGS: Clear to auscultation bilaterally without wheezes, rales, or rhonchi HEART: Regular rhythm and rate without murmur, rub, or gallop. ABDOMEN: Non tender, positive bowel sounds in all 4 quadrants, no organomegaly. BACK: Back symmetric, no curvature. EXTREMITIES:less then 2 second capillary refill, no skin discoloration, no cyanosis  NEURO: alert & oriented x 3 with fluent speech, no focal motor/sensory deficits, gait normal   LABORATORY DATA: CBC    Component Value Date/Time   WBC 6.9 09/27/2016 1401   RBC 3.91 09/27/2016 1401   HGB 13.1 09/27/2016 1401   HCT 39.3 09/27/2016 1401   PLT 163 09/27/2016 1401   MCV 100.5 (H) 09/27/2016 1401   MCH 33.5 09/27/2016 1401   MCHC 33.3 09/27/2016 1401   RDW 11.9 09/27/2016 1401   LYMPHSABS 1.7 09/27/2016 1401   MONOABS 0.5 09/27/2016 1401   EOSABS 0.1 09/27/2016 1401    BASOSABS 0.0 09/27/2016 1401      Chemistry      Component Value Date/Time   NA 136 09/27/2016 1401   K 3.2 (L) 09/27/2016 1401   CL 99 (L) 09/27/2016 1401   CO2 28 09/27/2016 1401   BUN 16 09/27/2016 1401   CREATININE 0.77 09/27/2016 1401      Component Value Date/Time   CALCIUM 9.2 09/27/2016 1401   ALKPHOS 69 09/27/2016 1401   AST 26 09/27/2016 1401   ALT 16 09/27/2016 1401   BILITOT 0.3 09/27/2016 1401       RADIOGRAPHIC STUDIES:  No results found.   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 Both metastases greater than 3 mm but she also had extracapsular extension. No LV I was seen. ER +96%, PR +11%, Ki-67 20%, HER-2/neu nonamplified. Date of surgery was on 10/08/2009. She had dose dense epirubicin/Cytoxan for 4 cycles followed by weekly Taxol for 12 weeks followed by radiation therapy by Dr. Kyung Rudd finishing on 06/19/2010. She then started tamoxifen on 06/23/2010 and it was proposed that she will continue this for 10 years, but her hormone status demonstrates a post-menopausal state and therefore, she was switched to Arimidex on 04/03/2014.  Labs today: CBC diff, CMET.  I personally reviewed and went over laboratory results with the patient.  The results are noted within this dictation.  I personally reviewed and went over radiographic studies with the patient.  The results are noted within this dictation.  Mammogram November 2017 was BIRADS 1.  She will be due for her next mammogram in November 2018.  Return in 6 months for follow-up with labs.  Osteopenia Osteopenia in the setting of AI therapy for breast cancer.  On Ca++, Vit D, and Prolia beginning on 03/18/2015.  Labs today: CMET.  I personally reviewed and went over laboratory results with the patient.  The results are noted within this  dictation.  Prolia injection today and again in 6 months.  Last bone density was in Jan 2016.  Order is placed for repeat bone density exam and this is to be completed within the next 6 months.  Labs in 6 months: CMET.   THERAPY PLAN:  NCCN guidelines recommends the following surveillance for invasive breast cancer:  A. History and Physical exam every 4-6 months for 5 years and then every 12 months.  B. Mammography every 12 months  C. Women on Tamoxifen: annual gynecologic assessment every 12 months if uterus is present.  D. Women on aromatase inhibitor or who experience ovarian failure secondary to treatment should have monitoring of bone health with a bone mineral density determination at baseline and periodically thereafter.  E. Assess and encourage adherence to adjuvant endocrine therapy.  F. Evidence suggests that active lifestyle and achieving and maintaining an ideal body weight (20-25 BMI) may lead to optimal breast cancer outcomes.  All questions were answered. The patient knows to call the clinic with any problems, questions or concerns. We can certainly see the patient much sooner if necessary.  Patient and plan discussed with Dr. Twana First and she is in agreement with the aforementioned.   KEFALAS,THOMAS 09/27/2016 2:57 PM

## 2016-09-27 NOTE — Patient Instructions (Signed)
[facility] at [facility] Discharge Instructions  RECOMMENDATIONS MADE BY THE CONSULTANT AND ANY TEST RESULTS WILL BE SENT TO YOUR REFERRING PHYSICIAN.  Received Prolia injection today. Follow-up as scheduled. Call clinic for any questions or concerns  Thank you for choosing [facility] at [facility] to provide your oncology and hematology care.  To afford each patient quality time with our provider, please arrive at least 15 minutes before your scheduled appointment time.    If you have a lab appointment with the Cancer Center please come in thru the  Main Entrance and check in at the main information desk  You need to re-schedule your appointment should you arrive 10 or more minutes late.  We strive to give you quality time with our providers, and arriving late affects you and other patients whose appointments are after yours.  Also, if you no show three or more times for appointments you may be dismissed from the clinic at the providers discretion.     Again, thank you for choosing [facility].  Our hope is that these requests will decrease the amount of time that you wait before being seen by our physicians.       _____________________________________________________________  Should you have questions after your visit to [facility], please contact our office at [phone] between the hours of 8:30 a.m. and 4:30 p.m.  Voicemails left after 4:30 p.m. will not be returned until the following business day.  For prescription refill requests, have your pharmacy contact our office.       Resources For Cancer Patients and their Caregivers ? American Cancer Society: Can assist with transportation, wigs, general needs, runs Look Good Feel Better.        1-888-227-6333 ? Cancer Care: Provides financial assistance, online support groups, medication/co-pay assistance.  1-800-813-HOPE (4673) ? [facility] Assists [place] cancer patients and their families through emotional , educational and financial support.  336-427-4357 ? [place] DSS Where to apply for food stamps, Medicaid and utility assistance. 336-342-1394 ? RCATS: Transportation to medical appointments. 336-347-2287 ? Social Security Administration: May apply for disability if have a Stage IV cancer. 336-342-7796 1-800-772-1213 ? [place] Aging, Disability and Transit Services: Assists with nutrition, care and transit needs. 336-349-2343  Cancer Center Support Programs: @10RELATIVEDAYS@ > Cancer Support Group  2nd Tuesday of the month 1pm-2pm, Journey Room  > Creative Journey  3rd Tuesday of the month 1130am-1pm, Journey Room  > Look Good Feel Better  1st Wednesday of the month 10am-12 noon, Journey Room (Call American Cancer Society to register 1-800-395-5775)   

## 2016-09-27 NOTE — Assessment & Plan Note (Signed)
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 Both metastases greater than 3 mm but she also had extracapsular extension. No LV I was seen. ER +96%, PR +11%, Ki-67 20%, HER-2/neu nonamplified. Date of surgery was on 10/08/2009. She had dose dense epirubicin/Cytoxan for 4 cycles followed by weekly Taxol for 12 weeks followed by radiation therapy by Dr. Kyung Rudd finishing on 06/19/2010. She then started tamoxifen on 06/23/2010 and it was proposed that she will continue this for 10 years, but her hormone status demonstrates a post-menopausal state and therefore, she was switched to Arimidex on 04/03/2014.    Labs today: CBC diff, CMET.  I personally reviewed and went over laboratory results with the patient.  The results are noted within this dictation.  I personally reviewed and went over radiographic studies with the patient.  The results are noted within this dictation.  Mammogram November 2017 was BIRADS 1.  She will be due for her next mammogram in November 2018.  Return in 6 months for follow-up with labs.

## 2016-09-27 NOTE — Assessment & Plan Note (Addendum)
Osteopenia in the setting of AI therapy for breast cancer.  On Ca++, Vit D, and Prolia beginning on 03/18/2015.  Labs today: [facility].  I personally reviewed and went over laboratory results with the patient.  The results are noted within this dictation.  Prolia injection today and again in 6 months.  Last bone density was in Jan 2016.  Order is placed for repeat bone density exam and this is to be completed within the next 6 months.  Labs in 6 months: [facility].

## 2016-09-28 ENCOUNTER — Other Ambulatory Visit (HOSPITAL_COMMUNITY): Payer: Self-pay

## 2016-09-28 DIAGNOSIS — E876 Hypokalemia: Secondary | ICD-10-CM

## 2016-09-28 MED ORDER — POTASSIUM CHLORIDE CRYS ER 20 MEQ PO TBCR: 20.0000 meq | EXTENDED_RELEASE_TABLET | Freq: Every day | ORAL | 0 refills | Status: DC

## 2016-10-22 ENCOUNTER — Ambulatory Visit (HOSPITAL_COMMUNITY)
Admission: RE | Admit: 2016-10-22 | Discharge: 2016-10-22 | Disposition: A | Discharge status: Home / Self Care | Payer: BLUE CROSS/BLUE SHIELD | Source: Ambulatory Visit | Attending: Oncology | Admitting: Oncology

## 2016-10-22 DIAGNOSIS — M8580 Other specified disorders of bone density and structure, unspecified site: Secondary | ICD-10-CM | POA: Diagnosis not present

## 2016-10-22 DIAGNOSIS — Z780 Asymptomatic menopausal state: Secondary | ICD-10-CM | POA: Insufficient documentation

## 2016-10-22 DIAGNOSIS — M85851 Other specified disorders of bone density and structure, right thigh: Secondary | ICD-10-CM | POA: Diagnosis not present

## 2016-10-22 DIAGNOSIS — C50912 Malignant neoplasm of unspecified site of left female breast: Secondary | ICD-10-CM | POA: Diagnosis not present

## 2016-10-31 ENCOUNTER — Other Ambulatory Visit (HOSPITAL_COMMUNITY): Payer: Self-pay | Admitting: Oncology

## 2016-10-31 DIAGNOSIS — E876 Hypokalemia: Secondary | ICD-10-CM

## 2016-11-01 ENCOUNTER — Other Ambulatory Visit: Payer: Self-pay | Admitting: Internal Medicine

## 2016-11-01 ENCOUNTER — Other Ambulatory Visit: Payer: Self-pay | Admitting: Family Medicine

## 2016-11-01 MED ORDER — AMPHETAMINE-DEXTROAMPHETAMINE 10 MG PO TABS: 10.0000 mg | ORAL_TABLET | Freq: Two times a day (BID) | ORAL | 0 refills | Status: DC

## 2016-11-01 NOTE — Telephone Encounter (Signed)
Pt calling to check the status of the Rx and would like to see if she can get it today due to her going to be out tomorrow.

## 2016-11-19 ENCOUNTER — Ambulatory Visit: Payer: BLUE CROSS/BLUE SHIELD | Admitting: Internal Medicine

## 2016-11-26 ENCOUNTER — Encounter: Payer: Self-pay | Admitting: Internal Medicine

## 2016-11-26 ENCOUNTER — Ambulatory Visit (INDEPENDENT_AMBULATORY_CARE_PROVIDER_SITE_OTHER): Payer: BLUE CROSS/BLUE SHIELD | Admitting: Internal Medicine

## 2016-11-26 VITALS — BP 112/62 | HR 87 | Temp 98.5°F | Ht 60.0 in | Wt 103.8 lb

## 2016-11-26 DIAGNOSIS — F331 Major depressive disorder, recurrent, moderate: Secondary | ICD-10-CM

## 2016-11-26 MED ORDER — AMPHETAMINE-DEXTROAMPHETAMINE 10 MG PO TABS: 10.0000 mg | ORAL_TABLET | Freq: Two times a day (BID) | ORAL | 0 refills | Status: DC

## 2016-11-26 MED ORDER — LORAZEPAM 0.5 MG PO TABS: ORAL_TABLET | ORAL | 5 refills | Status: DC

## 2016-11-26 MED ORDER — BUPROPION HCL ER (XL) 150 MG PO TB24: 150.0000 mg | ORAL_TABLET | Freq: Every day | ORAL | 3 refills | Status: DC

## 2016-11-26 NOTE — Patient Instructions (Signed)
  Resume medications as prescribed  Strongly consider behavioral health referral  Return in 6 weeks for follow-up

## 2016-11-26 NOTE — Progress Notes (Signed)
Subjective:    Patient ID: Renee [name], female    DOB: February 14, 1971, 46 y.o.   MRN: [id number]  HPI 46 year old patient who has a history of major depression.  This has flared in the past, usually due to significant stressors such as the diagnosis of breast cancer in the death of her husband, son.  More recently she and her husband have separated and she now lives alone in an apartment.  She has become much more depressed. She was last on Wellbutrin and had a very nice clinical response along with when necessary lorazepam.  She has been on Lexapro also in the past, which she feels was not as effective or as well tolerated  Past Medical History:  Diagnosis Date  . Acute medial meniscus tear of left knee 04/20/2016  . Breast cancer ([place])    2010 ; left side mastectomy chemo pill  . Depression   . DVT (deep venous thrombosis) (H[facility])    left arm  . Hypothyroidism   . Osteopenia 09/01/2014  . PTSD (post-traumatic stress disorder) 08/15/2014  . Rupture of posterior cruciate ligament of left knee 04/20/2016  . Severe major depression ([place]) 08/15/2014  . Thyroid disease      Social History   Social History  . Marital status: Divorced    Spouse name: N/A  . Number of children: N/A  . Years of education: N/A   Occupational History  . Not on file.   Social History Main Topics  . Smoking status: Former Smoker    Packs/day: 0.50    Years: 14.00    Quit date: 09/22/2010  . Smokeless tobacco: Never Used  . Alcohol use No  . Drug use: No  . Sexual activity: Not on file   Other Topics Concern  . Not on file   Social History Narrative  . No narrative on file    Past Surgical History:  Procedure Laterality Date  . BREAST CAPSULECTOMY WITH IMPLANT EXCHANGE Left 08/19/2011  . BREAST RECONSTRUCTION Left 01/15/2011  . MINOR IRRIGATION AND DEBRIDEMENT OF WOUND Left 03/05/2011   back and left breast  . MODIFIED RADICAL MASTECTOMY Left 10/08/2009  . PLACEMENT OF BREAST IMPLANTS Right  08/19/2011  . PORT-A-CATH REMOVAL  07/15/2010  . PORTACATH PLACEMENT  11/14/2009   with left breast wound revision    Family History  Problem Relation Age of Onset  . Hypertension Mother   . Diabetes Father   . Stroke Father   . Hypertension Father   . Hypertension Maternal Grandmother   . Diabetes Paternal Grandmother   . Cancer Paternal Grandfather     No Known Allergies  Current Outpatient Prescriptions on File Prior to Visit  Medication Sig Dispense Refill  . amphetamine-dextroamphetamine (ADDERALL) 10 MG tablet Take 1 tablet (10 mg total) by mouth 2 (two) times daily. 60 tablet 0  . anastrozole (ARIMIDEX) 1 MG tablet TAKE 1 TABLET EVERY DAY 30 tablet 5  . calcium-vitamin D (OSCAL WITH D) 500-200 MG-UNIT per tablet Take 1 tablet by mouth 2 (two) times daily. 60 tablet 11  . KLOR-CON M20 20 MEQ tablet TAKE 1 TABLET (20 MEQ TOTAL) BY MOUTH DAILY. 30 tablet 0  . levothyroxine (SYNTHROID, LEVOTHROID) 100 MCG tablet TAKE 1 TABLET EVERY DAY 90 tablet 1  . LORazepam (ATIVAN) 0.5 MG tablet TAKE TWO TABLETS BY MOUTH EVERY 8 HOURS AS NEEDED FOR ANXIETY 60 tablet 5   No current facility-administered medications on file prior to visit.     BP 112/62 (  BP Location: Right Arm, Patient Position: Sitting, Cuff Size: Normal)   Pulse 87   Temp 98.5 F (36.9 C) (Oral)   Ht 5' (1.524 m)   Wt 103 lb 12.8 oz (47.1 kg)   SpO2 98%   BMI 20.27 kg/m      Review of Systems  Constitutional: Positive for activity change, appetite change and unexpected weight change.  Psychiatric/Behavioral: Positive for decreased concentration, dysphoric mood and sleep disturbance. Negative for suicidal ideas. The patient is nervous/anxious.        Objective:   Physical Exam  Constitutional: She appears well-developed and well-nourished. She appears distressed.  Tearful  Psychiatric:  Alert, appropriate Intermittently tearful          Assessment & Plan:   Recurrent major depression.  We'll  resume Wellbutrin which has been quite helpful in the past.  Behavioral health referral.  Strongly recommended.  Contact information dispensed  Return in 6 weeks for follow-up  [name]

## 2016-11-26 NOTE — Progress Notes (Signed)
Pre visit review using our clinic review tool, if applicable. No additional management support is needed unless otherwise documented below in the visit note. 

## 2016-12-03 ENCOUNTER — Telehealth: Payer: Self-pay | Admitting: Internal Medicine

## 2016-12-03 NOTE — Telephone Encounter (Signed)
° °  Pt said her rx said donot fill till 12/07/16 and she said she is out of medicine and would like a call back

## 2016-12-03 NOTE — Telephone Encounter (Signed)
See message below, please advise.

## 2016-12-03 NOTE — Telephone Encounter (Signed)
Pt states the rx before that was filled on 3/26 and that is why she is out. Pt wants to know if iok to call the pharmacy after she drops off the rx to override Dr Marthann Schiller handwritten date to fill of 12/07/2016

## 2016-12-03 NOTE — Telephone Encounter (Signed)
Okay to refill today.

## 2016-12-05 ENCOUNTER — Other Ambulatory Visit: Payer: Self-pay | Admitting: Internal Medicine

## 2016-12-13 ENCOUNTER — Telehealth (HOSPITAL_COMMUNITY): Payer: Self-pay | Admitting: Emergency Medicine

## 2016-12-13 NOTE — Telephone Encounter (Signed)
Pt called and stated that during her self breast exam this weekend that she felt something.  She made an appt to follow up with this, this week.

## 2016-12-16 ENCOUNTER — Encounter (HOSPITAL_COMMUNITY): Payer: Self-pay | Admitting: Adult Health

## 2016-12-16 ENCOUNTER — Ambulatory Visit (HOSPITAL_COMMUNITY): Payer: BLUE CROSS/BLUE SHIELD | Admitting: Adult Health

## 2016-12-16 ENCOUNTER — Encounter (HOSPITAL_COMMUNITY): Payer: BLUE CROSS/BLUE SHIELD | Attending: Oncology | Admitting: Adult Health

## 2016-12-16 ENCOUNTER — Other Ambulatory Visit (HOSPITAL_COMMUNITY): Payer: Self-pay | Admitting: Adult Health

## 2016-12-16 VITALS — BP 117/76 | HR 73 | Temp 98.2°F | Resp 16 | Ht 60.0 in | Wt 104.0 lb

## 2016-12-16 DIAGNOSIS — M8580 Other specified disorders of bone density and structure, unspecified site: Secondary | ICD-10-CM | POA: Diagnosis not present

## 2016-12-16 DIAGNOSIS — F329 Major depressive disorder, single episode, unspecified: Secondary | ICD-10-CM | POA: Insufficient documentation

## 2016-12-16 DIAGNOSIS — M85852 Other specified disorders of bone density and structure, left thigh: Secondary | ICD-10-CM | POA: Insufficient documentation

## 2016-12-16 DIAGNOSIS — Z86718 Personal history of other venous thrombosis and embolism: Secondary | ICD-10-CM | POA: Insufficient documentation

## 2016-12-16 DIAGNOSIS — Z9889 Other specified postprocedural states: Secondary | ICD-10-CM | POA: Insufficient documentation

## 2016-12-16 DIAGNOSIS — E785 Hyperlipidemia, unspecified: Secondary | ICD-10-CM | POA: Insufficient documentation

## 2016-12-16 DIAGNOSIS — Z9012 Acquired absence of left breast and nipple: Secondary | ICD-10-CM | POA: Insufficient documentation

## 2016-12-16 DIAGNOSIS — Z9882 Breast implant status: Secondary | ICD-10-CM

## 2016-12-16 DIAGNOSIS — T8543XA Leakage of breast prosthesis and implant, initial encounter: Secondary | ICD-10-CM

## 2016-12-16 DIAGNOSIS — Z170 Estrogen receptor positive status [ER+]: Secondary | ICD-10-CM

## 2016-12-16 DIAGNOSIS — Z79811 Long term (current) use of aromatase inhibitors: Secondary | ICD-10-CM

## 2016-12-16 DIAGNOSIS — C50912 Malignant neoplasm of unspecified site of left female breast: Secondary | ICD-10-CM | POA: Diagnosis not present

## 2016-12-16 DIAGNOSIS — M899 Disorder of bone, unspecified: Secondary | ICD-10-CM

## 2016-12-16 DIAGNOSIS — Z923 Personal history of irradiation: Secondary | ICD-10-CM | POA: Insufficient documentation

## 2016-12-16 DIAGNOSIS — F4310 Post-traumatic stress disorder, unspecified: Secondary | ICD-10-CM | POA: Insufficient documentation

## 2016-12-16 DIAGNOSIS — E0500 Thyrotoxicosis with diffuse goiter without thyrotoxic crisis or storm: Secondary | ICD-10-CM | POA: Insufficient documentation

## 2016-12-16 DIAGNOSIS — Z87442 Personal history of urinary calculi: Secondary | ICD-10-CM | POA: Insufficient documentation

## 2016-12-16 DIAGNOSIS — E039 Hypothyroidism, unspecified: Secondary | ICD-10-CM | POA: Insufficient documentation

## 2016-12-16 NOTE — Pre-Procedure Instructions (Signed)
Right breast concerns

## 2016-12-16 NOTE — Patient Instructions (Signed)
[place] at [facility] Discharge Instructions  RECOMMENDATIONS MADE BY THE CONSULTANT AND ANY TEST RESULTS WILL BE SENT TO YOUR REFERRING PHYSICIAN.  You were seen today by Mike Craze NP. Mammogram and [place] as soon as possible. Referring you back to Dr. Marla Roe ([place]). Return as scheduled.   Thank you for choosing [place] at [facility] to provide your oncology and hematology care.  To afford each patient quality time with our provider, please arrive at least 15 minutes before your scheduled appointment time.    If you have a lab appointment with the [facility] please come in thru the  Main Entrance and check in at the main information desk  You need to re-schedule your appointment should you arrive 10 or more minutes late.  We strive to give you quality time with our providers, and arriving late affects you and other patients whose appointments are after yours.  Also, if you no show three or more times for appointments you may be dismissed from the clinic at the providers discretion.     Again, thank you for choosing [facility].  Our hope is that these requests will decrease the amount of time that you wait before being seen by our physicians.       _____________________________________________________________  Should you have questions after your visit to [facility], please contact our office at [phone] between the hours of 8:30 a.m. and 4:30 p.m.  Voicemails left after 4:30 p.m. will not be returned until the following business day.  For prescription refill requests, have your pharmacy contact our office.       Resources For Cancer Patients and their Caregivers ? American Cancer Society: Can assist with transportation, wigs, general needs, runs Look Good Feel Better.        2347898176 ? Cancer Care: Provides financial assistance, online support groups, medication/co-pay assistance.   1-800-813-HOPE (952) 625-7406) ? [place] Assists [place] cancer patients and their families through emotional , educational and financial support.  424-649-1514 ? [place] DSS Where to apply for food stamps, Medicaid and utility assistance. 908 803 5456 ? [facility]: Transportation to medical appointments. 872-497-7581 ? Social Security Administration: May apply for disability if have a Stage IV cancer. (289)694-3001 480 782 5517 ? [employer], Disability and Transit Services: Assists with nutrition, care and transit needs. [place] Support Programs: @10RELATIVEDAYS @ > Cancer Support Group  2nd Tuesday of the month 1pm-2pm, Journey Room  > Creative Journey  3rd Tuesday of the month 1130am-1pm, Journey Room  > Look Good Feel Better  1st Wednesday of the month 10am-12 noon, Journey Room (Call [place] to register [phone])

## 2016-12-16 NOTE — Progress Notes (Signed)
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  This patient is considered OSTEOPENIC according to [facility]) criteria.        HISTORY OF PRESENT ILLNESS:  (From Kirby Crigler, PA-C's last note on 09/27/16)     INTERVAL HISTORY:  [name] 46 y.o. female presents for work-in/urgent visit for new bilateral breast complaints.    States that her right breast s/p implant placement in 2011 with Dr. Theodoro Kos Dillingham "feels weird and it looks like it dropped"; symptoms noted over past 1 week.  Also expresses concerns regarding (L) breast; she has a nodule to (L) breast that is now and she would like for me to evaluate today.   Remains on Anastrozole with decent tolerance. Endorses arthralgias, particularly to her (L) hip and knees.  States that "I have a really high pain tolerance, so I have the joint aches/pains, but I just keep on moving and don't let it slow me down or worry me."  Denies vaginal dryness.   Last bone density in 10/2016 revealed osteopenia. Last Prolia injection was 09/27/16.   Socially, she is going through a divorce which has caused her increased stress. States that she stress has made her lose a bit of weight; based on our records, her weight is down about 7 lbs in past 3 months.  Otherwise, she is largely without complaints today.     REVIEW OF SYSTEMS:  Review of Systems    Constitutional: Negative for chills and fever.  HENT:  Negative.   Eyes: Negative.   Respiratory: Negative.   Cardiovascular: Negative.   Gastrointestinal: Negative.   Genitourinary: Negative.  Negative for dysuria, hematuria and vaginal bleeding.   Musculoskeletal: Positive for arthralgias.  Neurological: Negative.   Hematological: Negative.      PAST MEDICAL/SURGICAL HISTORY:  Past Medical History:  Diagnosis Date  . Acute medial meniscus tear of left knee 04/20/2016  . Breast cancer ([place])    2010 ; left side mastectomy chemo pill  . Depression   . DVT (deep venous thrombosis) (HCC)    left arm  . Hypothyroidism   . Osteopenia 09/01/2014  . PTSD (post-traumatic stress disorder) 08/15/2014  . Rupture of posterior cruciate ligament of left knee 04/20/2016  . Severe major depression ([place]) 08/15/2014  . Thyroid disease    Past Surgical History:  Procedure Laterality Date  . BREAST CAPSULECTOMY WITH IMPLANT EXCHANGE Left 08/19/2011  . BREAST RECONSTRUCTION Left 01/15/2011  . MINOR IRRIGATION AND DEBRIDEMENT OF WOUND Left 03/05/2011   back and left breast  . MODIFIED RADICAL MASTECTOMY Left 10/08/2009  . PLACEMENT OF BREAST IMPLANTS Right 08/19/2011  . PORT-A-CATH REMOVAL  07/15/2010  . PORTACATH PLACEMENT  11/14/2009   with left breast wound revision     SOCIAL HISTORY:  Social History   Social History  . Marital status: Divorced    Spouse name:  N/A  . Number of children: N/A  . Years of education: N/A   Occupational History  . Not on file.   Social History Main Topics  . Smoking status: Former Smoker    Packs/day: 0.50    Years: 14.00    Quit date: 09/22/2010  . Smokeless tobacco: Never Used  . Alcohol use No  . Drug use: No  . Sexual activity: Not on file   Other Topics Concern  . Not on file   Social History Narrative  . No narrative on file    FAMILY HISTORY:  Family History  Problem Relation Age of Onset  . Hypertension Mother   . Diabetes  Father   . Stroke Father   . Hypertension Father   . Hypertension Maternal Grandmother   . Diabetes Paternal Grandmother   . Cancer Paternal Grandfather     CURRENT MEDICATIONS:  Outpatient Encounter Prescriptions as of 12/16/2016  Medication Sig  . amphetamine-dextroamphetamine (ADDERALL) 10 MG tablet Take 1 tablet (10 mg total) by mouth 2 (two) times daily.  Marland Kitchen anastrozole (ARIMIDEX) 1 MG tablet TAKE 1 TABLET EVERY DAY  . buPROPion (WELLBUTRIN XL) 150 MG 24 hr tablet Take 1 tablet (150 mg total) by mouth daily.  . calcium-vitamin D (OSCAL WITH D) 500-200 MG-UNIT per tablet Take 1 tablet by mouth 2 (two) times daily.  Marland Kitchen LORazepam (ATIVAN) 0.5 MG tablet TAKE TWO TABLETS BY MOUTH EVERY 8 HOURS AS NEEDED FOR ANXIETY  . [DISCONTINUED] levothyroxine (SYNTHROID, LEVOTHROID) 100 MCG tablet TAKE 1 TABLET EVERY DAY  . KLOR-CON M20 20 MEQ tablet TAKE 1 TABLET (20 MEQ TOTAL) BY MOUTH DAILY. (Patient not taking: Reported on 12/16/2016)  . levothyroxine (SYNTHROID, LEVOTHROID) 112 MCG tablet levothyroxine sodium 112 mcg tabs  . [DISCONTINUED] buPROPion (WELLBUTRIN XL) 150 MG 24 hr tablet bupropion hcl xl 150 mg tb24   No facility-administered encounter medications on file as of 12/16/2016.     ALLERGIES:  No Known Allergies   PHYSICAL EXAM:  ECOG Performance status: 1 - Symptomatic, but independent.   Vitals:   12/16/16 0924  BP: 117/76  Pulse: 73  Resp: 16  Temp: 98.2 F (36.8 C)   Filed Weights   12/16/16 0924  Weight: 104 lb (47.2 kg)    Physical Exam  Constitutional: She is oriented to person, place, and time and well-developed, well-nourished, and in no distress.  HENT:  Head: Normocephalic.  Mouth/Throat: Oropharynx is clear and moist. No oropharyngeal exudate.  Eyes: Conjunctivae are normal. Pupils are equal, round, and reactive to light. No scleral icterus.  Neck: Normal range of motion. Neck supple.  Cardiovascular: Normal rate, regular rhythm and normal heart sounds.    Pulmonary/Chest: Effort normal and breath sounds normal. No respiratory distress. She has no wheezes. She has no rales.    Abdominal: Soft. Bowel sounds are normal. There is no tenderness. There is no rebound and no guarding.  Musculoskeletal: Normal range of motion. She exhibits no edema.  Lymphadenopathy:    She has no cervical adenopathy.    She has no axillary adenopathy.       Right: No supraclavicular adenopathy present.       Left: No supraclavicular adenopathy present.  Neurological: She is alert and oriented to person, place, and time. No cranial nerve deficit. Gait normal.  Skin: Skin is warm and dry.  Psychiatric: Mood, memory, affect and judgment normal.  Nursing note and vitals reviewed.    *Patient gave her verbal permission to have photos taken of  her breasts during today's clinic visit to be placed in her medical record. Photos were taken in the presence of Jene Every, RN.        LABORATORY DATA:  I have reviewed the labs as listed.  CBC    Component Value Date/Time   WBC 6.9 09/27/2016 1401   RBC 3.91 09/27/2016 1401   HGB 13.1 09/27/2016 1401   HCT 39.3 09/27/2016 1401   PLT 163 09/27/2016 1401   MCV 100.5 (H) 09/27/2016 1401   MCH 33.5 09/27/2016 1401   MCHC 33.3 09/27/2016 1401   RDW 11.9 09/27/2016 1401   LYMPHSABS 1.7 09/27/2016 1401   MONOABS 0.5 09/27/2016 1401   EOSABS 0.1 09/27/2016 1401   BASOSABS 0.0 09/27/2016 1401   CMP Latest Ref Rng & Units 09/27/2016 03/26/2016 03/05/2016  Glucose 65 - 99 mg/dL 97 110(H) 102(H)  BUN 6 - 20 mg/dL _0 Creatinine 0.44 - 1.00 mg/dL 0.77 0.81 0.73  Sodium 135 - 145 mmol/L 136 137 137  Potassium 3.5 - 5.1 mmol/L 3.2(L) 3.9 3.9  Chloride 101 - 111 mmol/L 99(L) 102 105  CO2 22 - 32 mmol/L _1 Calcium 8.9 - 10.3 mg/dL 9.2 9.6 8.9  Total Protein 6.5 - 8.1 g/dL 6.6 6.9 7.4  Total Bilirubin 0.3 - 1.2 mg/dL 0.3 0.4 0.5  Alkaline Phos 38 - 126 U/L 69 64 72  AST 15 - 41 U/L _2 ALT 14 - 54  U/L _3 PENDING LABS:    DIAGNOSTIC IMAGING:  *The following radiologic images and reports have been reviewed independently and agree with below findings.  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 She went on to receive adjuvant breast radiation therapy. Started Tamoxifen in 06/2010 through 03/2014. At that time, she was switched to Arimidex given post-menopausal status. She went on to have (L) breast latissimus dorsi flap breast reconstruction and placement of (R) breast implant for optimal cosmesis with Dr. Lyndee Leo [facility]anger Dillingham.  -Continue Arimidex through 06/2020, to complete 10 years of anti-estrogen therapy. She is tolerating well.  -Return to cancer center as scheduled for routine surveillance follow-up in 03/2017.   (R) breast concerns for ruptured silicone implant:  -Based on physical exam and patient's concerns, will order (R) breast diagnostic mammogram with ultrasound for further evaluation.  -Concern for ruptured silicone implant.  -Will place referral back to Dr. Marla Roe for her recommendations.    (L) breast concerns:  -Palpable area of concern for patient at 6:00, near intramammary fold position.  It is not solid and is mobile; likely the border of the implant, which may have shifted over the years.  There is no concern for malignancy.  -There is very small nodule to mid-sternal area near right breast at about 9:00, which is most consistent with benign oil cyst. Very low to no suspicion for recurrent malignancy to left breast based on physical exam findings today. Patient is reassured by this explanation.   Bone health:  -Last DEXA scan in 10/2016 shows continued osteopenia.  -Continue Prolia injections every 6 months;  next injection will be due in 03/2017 at time of follow-up visit.  -Recommended continued calcium/vitamin D supplementation and weight-bearing exercises.  Dispo:  -(R) breast diagnostic mammogram and ultrasound for further evaluation.  -Refer back to Dr. Marla Roe with Plastic Surgery for her recommendations.  -Return to cancer center as scheduled in 03/2017.    All questions were answered to patient's stated satisfaction. Encouraged patient to call with any new concerns or questions before her next visit to the cancer center and we can certain see her sooner, if needed.    Plan of care discussed with Dr. Talbert Cage, who agrees with the above aforementioned.    Orders placed this encounter:  Orders Placed This Encounter  Procedures  . MM DIAG BREAST TOMO UNI RIGHT  . US BREAST LTD UNI RIGHT INC AXILLA      Gretchen Dawson, NP [place] [phone]

## 2016-12-16 NOTE — Progress Notes (Signed)
  [place], [place] [address]     Dec 16, 2016    To Whom It May Concern:   Ms. BERNELL HAYNIE (DOB: 08-31-1970) was seen and evaluated at [facility] today for her history of breast cancer.  Please excuse her from work duties due to this appointment.  Feel free to contact our office with any questions or concerns.    Hulda Marin, NP [place] [phone]

## 2016-12-20 ENCOUNTER — Other Ambulatory Visit: Payer: Self-pay | Admitting: Internal Medicine

## 2016-12-21 ENCOUNTER — Other Ambulatory Visit (HOSPITAL_COMMUNITY): Payer: Self-pay | Admitting: Adult Health

## 2016-12-21 ENCOUNTER — Encounter (HOSPITAL_COMMUNITY): Payer: BLUE CROSS/BLUE SHIELD

## 2016-12-21 ENCOUNTER — Ambulatory Visit (HOSPITAL_COMMUNITY)
Admission: RE | Admit: 2016-12-21 | Discharge: 2016-12-21 | Disposition: A | Discharge status: Home / Self Care | Payer: BLUE CROSS/BLUE SHIELD | Source: Ambulatory Visit | Attending: Adult Health | Admitting: Adult Health

## 2016-12-21 DIAGNOSIS — Z9882 Breast implant status: Secondary | ICD-10-CM | POA: Diagnosis present

## 2016-12-21 DIAGNOSIS — Z853 Personal history of malignant neoplasm of breast: Secondary | ICD-10-CM | POA: Diagnosis not present

## 2016-12-21 DIAGNOSIS — X58XXXA Exposure to other specified factors, initial encounter: Secondary | ICD-10-CM | POA: Diagnosis not present

## 2016-12-21 DIAGNOSIS — C50912 Malignant neoplasm of unspecified site of left female breast: Secondary | ICD-10-CM

## 2016-12-21 DIAGNOSIS — T8549XA Other mechanical complication of breast prosthesis and implant, initial encounter: Secondary | ICD-10-CM | POA: Insufficient documentation

## 2016-12-21 DIAGNOSIS — T8543XA Leakage of breast prosthesis and implant, initial encounter: Secondary | ICD-10-CM

## 2016-12-21 DIAGNOSIS — R928 Other abnormal and inconclusive findings on diagnostic imaging of breast: Secondary | ICD-10-CM

## 2016-12-28 ENCOUNTER — Ambulatory Visit (HOSPITAL_COMMUNITY): Admission: RE | Admit: 2016-12-28 | Payer: BLUE CROSS/BLUE SHIELD | Source: Ambulatory Visit

## 2016-12-28 ENCOUNTER — Ambulatory Visit (HOSPITAL_COMMUNITY): Payer: BLUE CROSS/BLUE SHIELD

## 2016-12-28 ENCOUNTER — Other Ambulatory Visit (HOSPITAL_COMMUNITY): Payer: Self-pay | Admitting: Adult Health

## 2016-12-28 DIAGNOSIS — R928 Other abnormal and inconclusive findings on diagnostic imaging of breast: Secondary | ICD-10-CM

## 2016-12-28 DIAGNOSIS — C50912 Malignant neoplasm of unspecified site of left female breast: Secondary | ICD-10-CM

## 2016-12-28 DIAGNOSIS — Z9882 Breast implant status: Secondary | ICD-10-CM

## 2016-12-29 ENCOUNTER — Ambulatory Visit (HOSPITAL_COMMUNITY): Admission: RE | Admit: 2016-12-29 | Payer: BLUE CROSS/BLUE SHIELD | Source: Ambulatory Visit

## 2016-12-31 ENCOUNTER — Ambulatory Visit (HOSPITAL_COMMUNITY)
Admission: RE | Admit: 2016-12-31 | Discharge: 2016-12-31 | Disposition: A | Discharge status: Home / Self Care | Payer: BLUE CROSS/BLUE SHIELD | Source: Ambulatory Visit | Attending: Adult Health | Admitting: Adult Health

## 2016-12-31 DIAGNOSIS — R928 Other abnormal and inconclusive findings on diagnostic imaging of breast: Secondary | ICD-10-CM

## 2016-12-31 DIAGNOSIS — C50912 Malignant neoplasm of unspecified site of left female breast: Secondary | ICD-10-CM | POA: Diagnosis present

## 2016-12-31 DIAGNOSIS — Z9882 Breast implant status: Secondary | ICD-10-CM

## 2016-12-31 MED ORDER — GADOBENATE DIMEGLUMINE 529 MG/ML IV SOLN
10.0000 mL | Freq: Once | INTRAVENOUS | Status: AC | PRN
  Administered 2016-12-31: 9 mL via INTRAVENOUS

## 2017-01-04 ENCOUNTER — Other Ambulatory Visit (HOSPITAL_COMMUNITY): Payer: Self-pay | Admitting: Adult Health

## 2017-01-04 DIAGNOSIS — R928 Other abnormal and inconclusive findings on diagnostic imaging of breast: Secondary | ICD-10-CM

## 2017-01-10 ENCOUNTER — Encounter (HOSPITAL_COMMUNITY): Payer: Self-pay | Admitting: Adult Health

## 2017-02-24 ENCOUNTER — Telehealth: Payer: Self-pay | Admitting: Internal Medicine

## 2017-02-24 NOTE — Telephone Encounter (Signed)
° ° °  Pt request refill of the following: ° °amphetamine-dextroamphetamine (ADDERALL) 10 MG tablet ° ° °Phamacy: °

## 2017-02-25 MED ORDER — AMPHETAMINE-DEXTROAMPHETAMINE 10 MG PO TABS: 10.0000 mg | ORAL_TABLET | Freq: Two times a day (BID) | ORAL | 0 refills | Status: DC

## 2017-02-25 NOTE — Telephone Encounter (Signed)
Rx printed awaiting to be signed.  

## 2017-03-03 ENCOUNTER — Telehealth: Payer: Self-pay | Admitting: Internal Medicine

## 2017-03-03 NOTE — Telephone Encounter (Signed)
Pt has changed insurance and she is with [employer] and they are requiring PA for pts Adderall pt state that she is out of her medication as of today.

## 2017-03-03 NOTE — Telephone Encounter (Signed)
Pt is aware rx is ready for pick up. 

## 2017-03-04 NOTE — Telephone Encounter (Signed)
PA done and approved.  Left a message on identified cell informing the pt of approval.

## 2017-03-18 ENCOUNTER — Ambulatory Visit (INDEPENDENT_AMBULATORY_CARE_PROVIDER_SITE_OTHER): Payer: PRIVATE HEALTH INSURANCE | Admitting: Internal Medicine

## 2017-03-18 ENCOUNTER — Encounter: Payer: Self-pay | Admitting: Internal Medicine

## 2017-03-18 VITALS — BP 118/62 | HR 102 | Temp 98.5°F | Ht 60.0 in | Wt 105.4 lb

## 2017-03-18 DIAGNOSIS — Z0000 Encounter for general adult medical examination without abnormal findings: Secondary | ICD-10-CM | POA: Diagnosis not present

## 2017-03-18 NOTE — Patient Instructions (Addendum)
Health Maintenance for Postmenopausal Women Menopause is a normal process in which your reproductive ability comes to an end. This process happens gradually over a span of months to years, usually between the ages of 22 and 9. Menopause is complete when you have missed 12 consecutive menstrual periods. It is important to talk with your health care provider about some of the most common conditions that affect postmenopausal women, such as heart disease, cancer, and bone loss (osteoporosis). Adopting a healthy lifestyle and getting preventive care can help to promote your health and wellness. Those actions can also lower your chances of developing some of these common conditions. What should I know about menopause? During menopause, you may experience a number of symptoms, such as:  Moderate-to-severe hot flashes.  Night sweats.  Decrease in sex drive.  Mood swings.  Headaches.  Tiredness.  Irritability.  Memory problems.  Insomnia.  Choosing to treat or not to treat menopausal changes is an individual decision that you make with your health care provider. What should I know about hormone replacement therapy and supplements? Hormone therapy products are effective for treating symptoms that are associated with menopause, such as hot flashes and night sweats. Hormone replacement carries certain risks, especially as you become older. If you are thinking about using estrogen or estrogen with progestin treatments, discuss the benefits and risks with your health care provider. What should I know about heart disease and stroke? Heart disease, heart attack, and stroke become more likely as you age. This may be due, in part, to the hormonal changes that your body experiences during menopause. These can affect how your body processes dietary fats, triglycerides, and cholesterol. Heart attack and stroke are both medical emergencies. There are many things that you can do to help prevent heart disease  and stroke:  Have your blood pressure checked at least every 1-2 years. High blood pressure causes heart disease and increases the risk of stroke.  If you are 53-22 years old, ask your health care provider if you should take aspirin to prevent a heart attack or a stroke.  Do not use any tobacco products, including cigarettes, chewing tobacco, or electronic cigarettes. If you need help quitting, ask your health care provider.  It is important to eat a healthy diet and maintain a healthy weight. ? Be sure to include plenty of vegetables, fruits, low-fat dairy products, and lean protein. ? Avoid eating foods that are high in solid fats, added sugars, or salt (sodium).  Get regular exercise. This is one of the most important things that you can do for your health. ? Try to exercise for at least 150 minutes each week. The type of exercise that you do should increase your heart rate and make you sweat. This is known as moderate-intensity exercise. ? Try to do strengthening exercises at least twice each week. Do these in addition to the moderate-intensity exercise.  Know your numbers.Ask your health care provider to check your cholesterol and your blood glucose. Continue to have your blood tested as directed by your health care provider.  What should I know about cancer screening? There are several types of cancer. Take the following steps to reduce your risk and to catch any cancer development as early as possible. Breast Cancer  Practice breast self-awareness. ? This means understanding how your breasts normally appear and feel. ? It also means doing regular breast self-exams. Let your health care provider know about any changes, no matter how small.  If you are 40  or older, have a clinician do a breast exam (clinical breast exam or CBE) every year. Depending on your age, family history, and medical history, it may be recommended that you also have a yearly breast X-ray (mammogram).  If you  have a family history of breast cancer, talk with your health care provider about genetic screening.  If you are at high risk for breast cancer, talk with your health care provider about having an MRI and a mammogram every year.  Breast cancer (BRCA) gene test is recommended for women who have family members with BRCA-related cancers. Results of the assessment will determine the need for genetic counseling and BRCA1 and for BRCA2 testing. BRCA-related cancers include these types: ? Breast. This occurs in males or females. ? Ovarian. ? Tubal. This may also be called fallopian tube cancer. ? Cancer of the abdominal or pelvic lining (peritoneal cancer). ? Prostate. ? Pancreatic.  Cervical, Uterine, and Ovarian Cancer Your health care provider may recommend that you be screened regularly for cancer of the pelvic organs. These include your ovaries, uterus, and vagina. This screening involves a pelvic exam, which includes checking for microscopic changes to the surface of your cervix (Pap test).  For women ages 21-65, health care providers may recommend a pelvic exam and a Pap test every three years. For women ages 79-65, they may recommend the Pap test and pelvic exam, combined with testing for human papilloma virus (HPV), every five years. Some types of HPV increase your risk of cervical cancer. Testing for HPV may also be done on women of any age who have unclear Pap test results.  Other health care providers may not recommend any screening for nonpregnant women who are considered low risk for pelvic cancer and have no symptoms. Ask your health care provider if a screening pelvic exam is right for you.  If you have had past treatment for cervical cancer or a condition that could lead to cancer, you need Pap tests and screening for cancer for at least 20 years after your treatment. If Pap tests have been discontinued for you, your risk factors (such as having a new sexual partner) need to be  reassessed to determine if you should start having screenings again. Some women have medical problems that increase the chance of getting cervical cancer. In these cases, your health care provider may recommend that you have screening and Pap tests more often.  If you have a family history of uterine cancer or ovarian cancer, talk with your health care provider about genetic screening.  If you have vaginal bleeding after reaching menopause, tell your health care provider.  There are currently no reliable tests available to screen for ovarian cancer.  Lung Cancer Lung cancer screening is recommended for adults 69-62 years old who are at high risk for lung cancer because of a history of smoking. A yearly low-dose CT scan of the lungs is recommended if you:  Currently smoke.  Have a history of at least 30 pack-years of smoking and you currently smoke or have quit within the past 15 years. A pack-year is smoking an average of one pack of cigarettes per day for one year.  Yearly screening should:  Continue until it has been 15 years since you quit.  Stop if you develop a health problem that would prevent you from having lung cancer treatment.  Colorectal Cancer  This type of cancer can be detected and can often be prevented.  Routine colorectal cancer screening usually begins at  age 42 and continues through age 45.  If you have risk factors for colon cancer, your health care provider may recommend that you be screened at an earlier age.  If you have a family history of colorectal cancer, talk with your health care provider about genetic screening.  Your health care provider may also recommend using home test kits to check for hidden blood in your stool.  A small camera at the end of a tube can be used to examine your colon directly (sigmoidoscopy or colonoscopy). This is done to check for the earliest forms of colorectal cancer.  Direct examination of the colon should be repeated every  5-10 years until age 71. However, if early forms of precancerous polyps or small growths are found or if you have a family history or genetic risk for colorectal cancer, you may need to be screened more often.  Skin Cancer  Check your skin from head to toe regularly.  Monitor any moles. Be sure to tell your health care provider: ? About any new moles or changes in moles, especially if there is a change in a mole's shape or color. ? If you have a mole that is larger than the size of a pencil eraser.  If any of your family members has a history of skin cancer, especially at a young age, talk with your health care provider about genetic screening.  Always use sunscreen. Apply sunscreen liberally and repeatedly throughout the day.  Whenever you are outside, protect yourself by wearing long sleeves, pants, a wide-brimmed hat, and sunglasses.  What should I know about osteoporosis? Osteoporosis is a condition in which bone destruction happens more quickly than new bone creation. After menopause, you may be at an increased risk for osteoporosis. To help prevent osteoporosis or the bone fractures that can happen because of osteoporosis, the following is recommended:  If you are 46-71 years old, get at least 1,000 mg of calcium and at least 600 mg of vitamin D per day.  If you are older than age 55 but younger than age 65, get at least 1,200 mg of calcium and at least 600 mg of vitamin D per day.  If you are older than age 54, get at least 1,200 mg of calcium and at least 800 mg of vitamin D per day.  Smoking and excessive alcohol intake increase the risk of osteoporosis. Eat foods that are rich in calcium and vitamin D, and do weight-bearing exercises several times each week as directed by your health care provider. What should I know about how menopause affects my mental health? Depression may occur at any age, but it is more common as you become older. Common symptoms of depression  include:  Low or sad mood.  Changes in sleep patterns.  Changes in appetite or eating patterns.  Feeling an overall lack of motivation or enjoyment of activities that you previously enjoyed.  Frequent crying spells.  Talk with your health care provider if you think that you are experiencing depression. What should I know about immunizations? It is important that you get and maintain your immunizations. These include:  Tetanus, diphtheria, and pertussis (Tdap) booster vaccine.  Influenza every year before the flu season begins.  Pneumonia vaccine.  Shingles vaccine.  Your health care provider may also recommend other immunizations. This information is not intended to replace advice given to you by your health care provider. Make sure you discuss any questions you have with your health care provider. Document Released: 09/17/2005  Document Revised: 02/13/2016 Document Reviewed: 04/29/2015 Elsevier Interactive Patient Education  2018 [employer]  

## 2017-03-18 NOTE — Progress Notes (Signed)
Subjective:    Patient ID: Renee [name], female    DOB: Oct 11, 1970, 46 y.o.   MRN: [id number]  HPI  46 year old patient who is seen today for a preventive health examination She is followed closely by oncology with a history of left breast cancer.  She has had reconstructive breast surgery performed.  She was seen and followed by oncology.  3 months ago. She has a history of osteopenia in January 2016 in the setting of aromatase inhibitor therapy.  She has been on Prolene in the past.  Follow-up bone density study was in March 2018 that revealed osteopenia.  Her last probably an injection was February 2018 Social history she remains separated from her husband and anticipates a divorce  Past Medical History:  Diagnosis Date  . Acute medial meniscus tear of left knee 04/20/2016  . Breast cancer ([place])    2010 ; left side mastectomy chemo pill  . Depression   . DVT (deep venous thrombosis) ([facility])    left arm  . Hypothyroidism   . Osteopenia 09/01/2014  . PTSD (post-traumatic stress disorder) 08/15/2014  . Rupture of posterior cruciate ligament of left knee 04/20/2016  . 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 Thyroid disease      Social History   Social History  . Marital status: Divorced    Spouse name: N/A  . Number of children: N/A  . Years of education: N/A   Occupational History  . Not on file.   Social History Main Topics  . Smoking status: Former Smoker    Packs/day: 0.50    Years: 14.00    Quit date: 09/22/2010  . Smokeless tobacco: Never Used  . Alcohol use No  . Drug use: No  . Sexual activity: Not on file   Other Topics Concern  . Not on file   Social History Narrative  . No narrative on file    Past Surgical History:  Procedure Laterality Date  . BREAST CAPSULECTOMY WITH IMPLANT EXCHANGE Left 08/19/2011  . BREAST RECONSTRUCTION Left 01/15/2011  . MINOR IRRIGATION AND DEBRIDEMENT OF WOUND Left 03/05/2011   back and left breast  . MODIFIED RADICAL  MASTECTOMY Left 10/08/2009  . PLACEMENT OF BREAST IMPLANTS Right 08/19/2011  . PORT-A-CATH REMOVAL  07/15/2010  . PORTACATH PLACEMENT  11/14/2009   with left breast wound revision    Family History  Problem Relation Age of Onset  . Hypertension Mother   . Diabetes Father   . Stroke Father   . Hypertension Father   . Hypertension Maternal Grandmother   . Diabetes Paternal Grandmother   . Cancer Paternal Grandfather     No Known Allergies  Current Outpatient Prescriptions on File Prior to Visit  Medication Sig Dispense Refill  . amphetamine-dextroamphetamine (ADDERALL) 10 MG tablet Take 1 tablet (10 mg total) by mouth 2 (two) times daily. 60 tablet 0  . anastrozole (ARIMIDEX) 1 MG tablet TAKE 1 TABLET EVERY DAY 30 tablet 5  . buPROPion (WELLBUTRIN XL) 150 MG 24 hr tablet Take 1 tablet (150 mg total) by mouth daily. 90 tablet 3  . calcium-vitamin D (OSCAL WITH D) 500-200 MG-UNIT per tablet Take 1 tablet by mouth 2 (two) times daily. 60 tablet 11  . levothyroxine (SYNTHROID, LEVOTHROID) 112 MCG tablet levothyroxine sodium 112 mcg tabs    . LORazepam (ATIVAN) 0.5 MG tablet TAKE TWO TABLETS BY MOUTH EVERY 8 HOURS AS NEEDED FOR ANXIETY 60 tablet 5   No current facility-administered medications on file prior  to visit.     BP 118/62 (BP Location: Left Arm, Patient Position: Sitting, Cuff Size: Normal)   Pulse (!) 102   Temp 98.5 F (36.9 C) (Oral)   Ht 5' (1.524 m)   Wt 105 lb 6.4 oz (47.8 kg)   SpO2 98%   BMI 20.58 kg/m     Review of Systems  Constitutional: Negative.   HENT: Negative for congestion, dental problem, hearing loss, rhinorrhea, sinus pressure, sore throat and tinnitus.   Eyes: Negative for pain, discharge and visual disturbance.  Respiratory: Negative for cough and shortness of breath.   Cardiovascular: Negative for chest pain, palpitations and leg swelling.  Gastrointestinal: Negative for abdominal distention, abdominal pain, blood in stool, constipation,  diarrhea, nausea and vomiting.  Genitourinary: Negative for difficulty urinating, dysuria, flank pain, frequency, hematuria, pelvic pain, urgency, vaginal bleeding, vaginal discharge and vaginal pain.  Musculoskeletal: Negative for arthralgias, gait problem and joint swelling.  Skin: Negative for rash.  Neurological: Negative for dizziness, syncope, speech difficulty, weakness, numbness and headaches.  Hematological: Negative for adenopathy.  Psychiatric/Behavioral: Positive for dysphoric mood. Negative for agitation and behavioral problems. The patient is not nervous/anxious.        Objective:   Physical Exam  Constitutional: She is oriented to person, place, and time. She appears well-developed and well-nourished.  HENT:  Head: Normocephalic and atraumatic.  Right Ear: External ear normal.  Left Ear: External ear normal.  Mouth/Throat: Oropharynx is clear and moist.  Eyes: Conjunctivae and EOM are normal.  Neck: Normal range of motion. Neck supple. No JVD present. No thyromegaly present.  Cardiovascular: Normal rate, regular rhythm, normal heart sounds and intact distal pulses.   No murmur heard. Pulmonary/Chest: Effort normal and breath sounds normal. She has no wheezes. She has no rales.  Abdominal: Soft. Bowel sounds are normal. She exhibits no distension and no mass. There is no tenderness. There is no rebound and no guarding.  Genitourinary: Vagina normal.  Musculoskeletal: Normal range of motion. She exhibits no edema or tenderness.  Neurological: She is alert and oriented to person, place, and time. She has normal reflexes. No cranial nerve deficit. She exhibits normal muscle tone. Coordination normal.  Skin: Skin is warm and dry. No rash noted.  Psychiatric: She has a normal mood and affect. Her behavior is normal.          Assessment & Plan:   Preventive health examination History of left breast cancer, status post reconstruction Hypothyroidism Osteopenia  Continue  calcium and vitamin D supplements History of major depression, stable History of nephrolithiasis ADHD.  Continue Adderall.  Follow-up 6 months  Follow-up oncology  [name]

## 2017-03-25 ENCOUNTER — Other Ambulatory Visit (HOSPITAL_COMMUNITY): Payer: Self-pay | Admitting: *Deleted

## 2017-03-25 DIAGNOSIS — C50919 Malignant neoplasm of unspecified site of unspecified female breast: Secondary | ICD-10-CM

## 2017-03-28 ENCOUNTER — Ambulatory Visit (HOSPITAL_COMMUNITY): Payer: BLUE CROSS/BLUE SHIELD

## 2017-03-28 ENCOUNTER — Encounter (HOSPITAL_COMMUNITY): Payer: Self-pay | Admitting: Oncology

## 2017-03-28 ENCOUNTER — Encounter (HOSPITAL_BASED_OUTPATIENT_CLINIC_OR_DEPARTMENT_OTHER): Payer: PRIVATE HEALTH INSURANCE | Admitting: Oncology

## 2017-03-28 ENCOUNTER — Ambulatory Visit (HOSPITAL_COMMUNITY): Payer: BLUE CROSS/BLUE SHIELD | Admitting: Oncology

## 2017-03-28 ENCOUNTER — Other Ambulatory Visit (HOSPITAL_COMMUNITY): Payer: BLUE CROSS/BLUE SHIELD

## 2017-03-28 ENCOUNTER — Encounter (HOSPITAL_BASED_OUTPATIENT_CLINIC_OR_DEPARTMENT_OTHER): Payer: PRIVATE HEALTH INSURANCE

## 2017-03-28 ENCOUNTER — Encounter (HOSPITAL_COMMUNITY): Payer: PRIVATE HEALTH INSURANCE | Attending: Oncology

## 2017-03-28 VITALS — BP 123/79 | HR 77 | Temp 97.9°F | Resp 16 | Ht 60.0 in | Wt 106.5 lb

## 2017-03-28 DIAGNOSIS — C50919 Malignant neoplasm of unspecified site of unspecified female breast: Secondary | ICD-10-CM

## 2017-03-28 DIAGNOSIS — M8580 Other specified disorders of bone density and structure, unspecified site: Secondary | ICD-10-CM

## 2017-03-28 DIAGNOSIS — C50912 Malignant neoplasm of unspecified site of left female breast: Secondary | ICD-10-CM | POA: Diagnosis not present

## 2017-03-28 DIAGNOSIS — C773 Secondary and unspecified malignant neoplasm of axilla and upper limb lymph nodes: Secondary | ICD-10-CM

## 2017-03-28 DIAGNOSIS — Z170 Estrogen receptor positive status [ER+]: Secondary | ICD-10-CM

## 2017-03-28 DIAGNOSIS — M85852 Other specified disorders of bone density and structure, left thigh: Secondary | ICD-10-CM

## 2017-03-28 LAB — COMPREHENSIVE METABOLIC PANEL
ALBUMIN: 3.9 g/dL (ref 3.5–5.0)
ALT: 15 U/L (ref 14–54)
ANION GAP: 7 (ref 5–15)
AST: 23 U/L (ref 15–41)
Alkaline Phosphatase: 57 U/L (ref 38–126)
BUN: 14 mg/dL (ref 6–20)
CALCIUM: 9.1 mg/dL (ref 8.9–10.3)
CO2: 26 mmol/L (ref 22–32)
Chloride: 101 mmol/L (ref 101–111)
Creatinine, Ser: 0.91 mg/dL (ref 0.44–1.00)
GFR calc non Af Amer: >60 mL/min (ref >60)
GLUCOSE: 84 mg/dL (ref 65–99)
POTASSIUM: 3.5 mmol/L (ref 3.5–5.1)
SODIUM: 134 mmol/L — AB (ref 135–145)
Total Bilirubin: 0.5 mg/dL (ref 0.3–1.2)
Total Protein: 6.6 g/dL (ref 6.5–8.1)

## 2017-03-28 LAB — CBC WITH DIFFERENTIAL/PLATELET
BASOS PCT: 0 %
Basophils Absolute: 0 10*3/uL (ref 0.0–0.1)
EOS ABS: 0.2 10*3/uL (ref 0.0–0.7)
EOS PCT: 2 %
HCT: 38.1 % (ref 36.0–46.0)
HEMOGLOBIN: 12.9 g/dL (ref 12.0–15.0)
Lymphocytes Relative: 26 %
Lymphs Abs: 2.5 10*3/uL (ref 0.7–4.0)
MCH: 35.1 pg — AB (ref 26.0–34.0)
MCHC: 33.9 g/dL (ref 30.0–36.0)
MCV: 103.5 fL — ABNORMAL HIGH (ref 78.0–100.0)
MONO ABS: 0.8 10*3/uL (ref 0.1–1.0)
MONOS PCT: 8 %
NEUTROS PCT: 64 %
Neutro Abs: 6 10*3/uL (ref 1.7–7.7)
PLATELETS: 178 10*3/uL (ref 150–400)
RBC: 3.68 MIL/uL — ABNORMAL LOW (ref 3.87–5.11)
RDW: 13.1 % (ref 11.5–15.5)
WBC: 9.5 10*3/uL (ref 4.0–10.5)

## 2017-03-28 MED ORDER — SODIUM CHLORIDE 0.9 % IV SOLN: Freq: Once | INTRAVENOUS | Status: DC

## 2017-03-28 MED ORDER — DENOSUMAB 60 MG/ML ~~LOC~~ SOLN
60.0000 mg | Freq: Once | SUBCUTANEOUS | Status: AC
  Administered 2017-03-28: 60 mg via SUBCUTANEOUS
  Filled 2017-03-28: qty 1

## 2017-03-28 NOTE — Progress Notes (Signed)
Renee [name] presents today for injection per MD orders. Prolia 60mg  administered SQ in right Abdomen. Administration without incident. Patient tolerated well.

## 2017-03-28 NOTE — Progress Notes (Signed)
Patient is taking anastrozole and has not missed any doses and reports no side effects at this time.  

## 2017-03-28 NOTE — Progress Notes (Signed)
 [facility] [address] 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  This patient is considered OSTEOPENIC according to World Health Organization (WHO) criteria.        HISTORY OF PRESENT ILLNESS:  (From Tom Kefalas, PA-C's last note on 09/27/16)     INTERVAL HISTORY:  [name] 46 y.o. female presents for routine follow up for her breast cancer. She has been doing well since her last visit. She has not palpated any new breast masses or axillary lymphadenopathy. She denies any chest pain, shortness of breath, abdominal pain. She continues to take calcium and vitamin D, as well as her anastrozole.    REVIEW OF SYSTEMS:  Review of Systems  Constitutional: Negative for chills and fever.  HENT:  Negative.   Eyes: Negative.   Respiratory: Negative.   Cardiovascular: Negative.   Gastrointestinal: Negative.   Genitourinary: Negative.  Negative for dysuria, hematuria and vaginal bleeding.   Musculoskeletal: Negative for arthralgias.  Neurological: Negative.   Hematological: Negative.      PAST MEDICAL/SURGICAL HISTORY:  Past Medical History:  Diagnosis Date  . Acute medial meniscus tear of left knee 04/20/2016  . Breast cancer (HCC)    2010 ; left side mastectomy chemo pill  . Depression   . DVT (deep venous thrombosis) (HCC)    left arm  . Hypothyroidism   . Osteopenia 09/01/2014  . PTSD (post-traumatic stress disorder)  08/15/2014  . Rupture of posterior cruciate ligament of left knee 04/20/2016  . 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  . Thyroid disease    Past Surgical History:  Procedure Laterality Date  . BREAST CAPSULECTOMY WITH IMPLANT EXCHANGE Left 08/19/2011  . BREAST RECONSTRUCTION Left 01/15/2011  . MINOR IRRIGATION AND DEBRIDEMENT OF WOUND Left 03/05/2011   back and left breast  . MODIFIED RADICAL MASTECTOMY Left 10/08/2009  . PLACEMENT OF BREAST IMPLANTS Right 08/19/2011  . PORT-A-CATH REMOVAL  07/15/2010  . PORTACATH PLACEMENT  11/14/2009   with left breast wound revision     SOCIAL HISTORY:  Social History   Social History  . Marital status: Divorced    Spouse name: N/A  . Number of children: N/A  . Years of education: N/A   Occupational History  . Not on file.   Social History Main Topics  . Smoking status: Former Smoker    Packs/day: 0.50    Years: 14.00    Quit date: 09/22/2010  . Smokeless tobacco: Never Used  . Alcohol use No  . Drug use: No  . Sexual activity: Not on file   Other Topics Concern  . Not on file   Social History Narrative  . No narrative on file    FAMILY HISTORY:  Family History  Problem Relation Age of Onset  . Hypertension Mother   . Diabetes Father   . Stroke Father   . Hypertension Father   . Hypertension Maternal Grandmother   .   Diabetes Paternal Grandmother   . Cancer Paternal Grandfather     CURRENT MEDICATIONS:  Outpatient Encounter Prescriptions as of 03/28/2017  Medication Sig  . amphetamine-dextroamphetamine (ADDERALL) 10 MG tablet Take 1 tablet (10 mg total) by mouth 2 (two) times daily.  Marland Kitchen anastrozole (ARIMIDEX) 1 MG tablet TAKE 1 TABLET EVERY DAY  . buPROPion (WELLBUTRIN XL) 150 MG 24 hr tablet Take 1 tablet (150 mg total) by mouth daily.  . calcium-vitamin D (OSCAL WITH D) 500-200 MG-UNIT per tablet Take 1 tablet by mouth 2 (two) times daily.  Marland Kitchen levothyroxine (SYNTHROID, LEVOTHROID) 112 MCG tablet levothyroxine sodium  112 mcg tabs  . LORazepam (ATIVAN) 0.5 MG tablet TAKE TWO TABLETS BY MOUTH EVERY 8 HOURS AS NEEDED FOR ANXIETY   No facility-administered encounter medications on file as of 03/28/2017.     ALLERGIES:  No Known Allergies   PHYSICAL EXAM:  ECOG Performance status: 1 - Symptomatic, but independent.   Vitals:   03/28/17 1510  BP: 123/79  Pulse: 77  Resp: 16  Temp: 97.9 F (36.6 C)  SpO2: 100%   Filed Weights   03/28/17 1510  Weight: 106 lb 8 oz (48.3 kg)    Physical Exam  Constitutional: She is oriented to person, place, and time and well-developed, well-nourished, and in no distress.  HENT:  Head: Normocephalic.  Mouth/Throat: Oropharynx is clear and moist. No oropharyngeal exudate.  Eyes: Pupils are equal, round, and reactive to light. Conjunctivae are normal. No scleral icterus.  Neck: Normal range of motion. Neck supple.  Cardiovascular: Normal rate, regular rhythm and normal heart sounds.   Pulmonary/Chest: Effort normal and breath sounds normal. No respiratory distress. She has no wheezes. She has no rales.  Abdominal: Soft. Bowel sounds are normal. There is no tenderness. There is no rebound and no guarding.  Musculoskeletal: Normal range of motion. She exhibits no edema.  Lymphadenopathy:    She has no cervical adenopathy.    She has no axillary adenopathy.       Right: No supraclavicular adenopathy present.       Left: No supraclavicular adenopathy present.  Neurological: She is alert and oriented to person, place, and time. No cranial nerve deficit. Gait normal.  Skin: Skin is warm and dry.  Psychiatric: Mood, memory, affect and judgment normal.  Nursing note and vitals reviewed. CHEST: bilateral breast reconstruction. No masses felt around the implants. No skin changes.  No axillary lymphadenopathy bilaterally.       LABORATORY DATA:  I have reviewed the labs as listed.  CBC    Component Value Date/Time   WBC 9.5 03/28/2017 1434   RBC 3.68 (L)  03/28/2017 1434   HGB 12.9 03/28/2017 1434   HCT 38.1 03/28/2017 1434   PLT 178 03/28/2017 1434   MCV 103.5 (H) 03/28/2017 1434   MCH 35.1 (H) 03/28/2017 1434   MCHC 33.9 03/28/2017 1434   RDW 13.1 03/28/2017 1434   LYMPHSABS 2.5 03/28/2017 1434   MONOABS 0.8 03/28/2017 1434   EOSABS 0.2 03/28/2017 1434   BASOSABS 0.0 03/28/2017 1434   CMP Latest Ref Rng & Units 03/28/2017 09/27/2016 03/26/2016  Glucose 65 - 99 mg/dL 84 97 110(H)  BUN 6 - 20 mg/dL _0 Creatinine 0.44 - 1.00 mg/dL 0.91 0.77 0.81  Sodium 135 - 145 mmol/L 134(L) 136 137  Potassium 3.5 - 5.1 mmol/L 3.5 3.2(L) 3.9  Chloride 101 - 111 mmol/L 101 99(L) 102  CO2 22 - 32 mmol/L _1 Calcium  8.9 - 10.3 mg/dL 9.1 9.2 9.6  Total Protein 6.5 - 8.1 g/dL 6.6 6.6 6.9  Total Bilirubin 0.3 - 1.2 mg/dL 0.5 0.3 0.4  Alkaline Phos 38 - 126 U/L 57 69 64  AST 15 - 41 U/L _0 ALT 14 - 54 U/L _1 PENDING LABS:    DIAGNOSTIC IMAGING:  *The following radiologic images and reports have been reviewed independently and agree with below findings.  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 She went on to receive adjuvant breast radiation therapy. Started Tamoxifen in 06/2010 through 03/2014. At that time, she was switched to Arimidex given post-menopausal status. She went on to have (L) breast latissimus dorsi flap breast reconstruction and placement of (R) breast implant for optimal cosmesis with Dr. Lyndee Leo Sanger Dillingham.  -Continue Arimidex through 06/2020, to complete 10 years of anti-estrogen therapy. She is tolerating well.  -Clinically NED on breast exam today.  Bone health:  -Last DEXA scan in 10/2016 shows continued osteopenia.  -Continue Prolia injections every 6  months; she will get an injection today. -Recommended continued calcium/vitamin D supplementation and weight-bearing exercises.      Dispo:  -(R) breast diagnostic mammogram in november.  -RTC in 6 months for follow up with labs.  All questions were answered to patient's stated satisfaction. Encouraged patient to call with any new concerns or questions before her next visit to the cancer center and we can certain see her sooner, if needed.      Orders placed this encounter:  Orders Placed This Encounter  Procedures  . MM DIAG BREAST TOMO UNI RIGHT  . CBC with Differential  . Comprehensive metabolic panel    Twana First, MD

## 2017-03-28 NOTE — Patient Instructions (Signed)
[facility] at [facility] Discharge Instructions  RECOMMENDATIONS MADE BY THE CONSULTANT AND ANY TEST RESULTS WILL BE SENT TO YOUR REFERRING PHYSICIAN.  Prolia given today. Follow up as scheduled.  Thank you for choosing [facility] at [facility] to provide your oncology and hematology care.  To afford each patient quality time with our provider, please arrive at least 15 minutes before your scheduled appointment time.    If you have a lab appointment with the Cancer Center please come in thru the  Main Entrance and check in at the main information desk  You need to re-schedule your appointment should you arrive 10 or more minutes late.  We strive to give you quality time with our providers, and arriving late affects you and other patients whose appointments are after yours.  Also, if you no show three or more times for appointments you may be dismissed from the clinic at the providers discretion.     Again, thank you for choosing [facility].  Our hope is that these requests will decrease the amount of time that you wait before being seen by our physicians.       _____________________________________________________________  Should you have questions after your visit to [facility], please contact our office at [phone] between the hours of 8:30 a.m. and 4:30 p.m.  Voicemails left after 4:30 p.m. will not be returned until the following business day.  For prescription refill requests, have your pharmacy contact our office.       Resources For Cancer Patients and their Caregivers ? American Cancer Society: Can assist with transportation, wigs, general needs, runs Look Good Feel Better.        1-888-227-6333 ? Cancer Care: Provides financial assistance, online support groups, medication/co-pay assistance.  1-800-813-HOPE (4673) ? [facility] Assists [place] cancer patients and  their families through emotional , educational and financial support.  336-427-4357 ? [place] DSS Where to apply for food stamps, Medicaid and utility assistance. 336-342-1394 ? [facility]: Transportation to medical appointments. 336-347-2287 ? Social Security Administration: May apply for disability if have a Stage IV cancer. 336-342-7796 1-800-772-1213 ? [place] Aging, Disability and Transit Services: Assists with nutrition, care and transit needs. 336-349-2343  Cancer Center Support Programs: @[email or web address]@ > Cancer Support Group  2nd Tuesday of the month 1pm-2pm, Journey Room  > Creative Journey  3rd Tuesday of the month 1130am-1pm, Journey Room  > Look Good Feel Better  1st Wednesday of the month 10am-12 noon, Journey Room (Call American Cancer Society to register 1-800-395-5775)   

## 2017-04-05 ENCOUNTER — Telehealth: Payer: Self-pay | Admitting: Internal Medicine

## 2017-04-05 MED ORDER — AMPHETAMINE-DEXTROAMPHETAMINE 10 MG PO TABS: 10.0000 mg | ORAL_TABLET | Freq: Two times a day (BID) | ORAL | 0 refills | Status: DC

## 2017-04-05 NOTE — Telephone Encounter (Signed)
Pt notified Rx ready for pickup. Rx printed and signed.  

## 2017-04-05 NOTE — Telephone Encounter (Signed)
° ° ° °  Pt request refill of the following:  amphetamine-dextroamphetamine (ADDERALL) 10 MG tablet   Phamacy:

## 2017-04-05 NOTE — Telephone Encounter (Signed)
Rx printed, awaiting to be signed by MD.

## 2017-04-28 ENCOUNTER — Encounter: Payer: Self-pay | Admitting: Internal Medicine

## 2017-05-04 ENCOUNTER — Telehealth: Payer: Self-pay | Admitting: Internal Medicine

## 2017-05-04 NOTE — Telephone Encounter (Signed)
Pt needs new rx generic adderall 10 mg

## 2017-05-05 MED ORDER — AMPHETAMINE-DEXTROAMPHETAMINE 10 MG PO TABS: 10.0000 mg | ORAL_TABLET | Freq: Two times a day (BID) | ORAL | 0 refills | Status: DC

## 2017-05-05 NOTE — Telephone Encounter (Signed)
rx printed awaiting to be signed.

## 2017-05-06 NOTE — Telephone Encounter (Signed)
Pt notified Rx ready for pickup. Rx printed and signed.  

## 2017-05-21 ENCOUNTER — Other Ambulatory Visit: Payer: Self-pay | Admitting: Internal Medicine

## 2017-05-30 ENCOUNTER — Other Ambulatory Visit: Payer: Self-pay | Admitting: Internal Medicine

## 2017-06-01 ENCOUNTER — Ambulatory Visit (INDEPENDENT_AMBULATORY_CARE_PROVIDER_SITE_OTHER): Payer: PRIVATE HEALTH INSURANCE | Admitting: Internal Medicine

## 2017-06-01 ENCOUNTER — Encounter: Payer: Self-pay | Admitting: Internal Medicine

## 2017-06-01 VITALS — BP 132/80 | HR 89 | Temp 98.2°F | Ht 60.0 in | Wt 111.4 lb

## 2017-06-01 DIAGNOSIS — F900 Attention-deficit hyperactivity disorder, predominantly inattentive type: Secondary | ICD-10-CM

## 2017-06-01 DIAGNOSIS — E890 Postprocedural hypothyroidism: Secondary | ICD-10-CM

## 2017-06-01 DIAGNOSIS — J0100 Acute maxillary sinusitis, unspecified: Secondary | ICD-10-CM | POA: Diagnosis not present

## 2017-06-01 LAB — TSH: TSH: 0.86 u[IU]/mL (ref 0.35–4.50)

## 2017-06-01 MED ORDER — AMPHETAMINE-DEXTROAMPHETAMINE 10 MG PO TABS: 10.0000 mg | ORAL_TABLET | Freq: Two times a day (BID) | ORAL | 0 refills | Status: DC

## 2017-06-01 MED ORDER — AZITHROMYCIN 250 MG PO TABS: ORAL_TABLET | ORAL | 0 refills | Status: DC

## 2017-06-01 NOTE — Progress Notes (Signed)
Subjective:    Patient ID: Renee [name], female    DOB: [birth year], 46 y.o.   MRN: [id number]  HPI 46 year old patient who presents with a two-week history of worsening sinus congestion.  She initially had sore throat and headache, but now sinus congestion is the predominant symptom.  This is associated with green sputum production, cough.  No documented fever, but she feels unwell.  She does describe some dental pain, but this seems to be improving slightly.  She has tried a number of OTC medications including Mucinex, NyQuil, DayQuil and other antihistamines.  She has been using saline salt water rinse  Symptoms continued to deteriorate after 2 weeks.   Past Medical History:  Diagnosis Date  . Acute medial meniscus tear of left knee 04/20/2016  . Breast cancer ([place])    2010 ; left side mastectomy chemo pill  . Depression   . DVT (deep venous thrombosis) (HCC)    left arm  . Hypothyroidism   . Osteopenia 09/01/2014  . PTSD (post-traumatic stress disorder) 08/15/2014  . Rupture of posterior cruciate ligament of left knee 04/20/2016  . Severe major depression ([place]) 08/15/2014  . Thyroid disease      Social History   Social History  . Marital status: Divorced    Spouse name: N/A  . Number of children: N/A  . Years of education: N/A   Occupational History  . Not on file.   Social History Main Topics  . Smoking status: Former Smoker    Packs/day: 0.50    Years: 14.00    Quit date: 09/22/2010  . Smokeless tobacco: Never Used  . Alcohol use No  . Drug use: No  . Sexual activity: Not on file   Other Topics Concern  . Not on file   Social History Narrative  . No narrative on file    Past Surgical History:  Procedure Laterality Date  . BREAST CAPSULECTOMY WITH IMPLANT EXCHANGE Left 08/19/2011  . BREAST RECONSTRUCTION Left 01/15/2011  . MINOR IRRIGATION AND DEBRIDEMENT OF WOUND Left 03/05/2011   back and left breast  . MODIFIED RADICAL MASTECTOMY Left 10/08/2009  .  PLACEMENT OF BREAST IMPLANTS Right 08/19/2011  . PORT-A-CATH REMOVAL  07/15/2010  . PORTACATH PLACEMENT  11/14/2009   with left breast wound revision    Family History  Problem Relation Age of Onset  . Hypertension Mother   . Diabetes Father   . Stroke Father   . Hypertension Father   . Hypertension Maternal Grandmother   . Diabetes Paternal Grandmother   . Cancer Paternal Grandfather     No Known Allergies  Current Outpatient Prescriptions on File Prior to Visit  Medication Sig Dispense Refill  . amphetamine-dextroamphetamine (ADDERALL) 10 MG tablet Take 1 tablet (10 mg total) by mouth 2 (two) times daily. 60 tablet 0  . anastrozole (ARIMIDEX) 1 MG tablet TAKE 1 TABLET EVERY DAY 30 tablet 2  . buPROPion (WELLBUTRIN XL) 150 MG 24 hr tablet Take 1 tablet (150 mg total) by mouth daily. 90 tablet 3  . calcium-vitamin D (OSCAL WITH D) 500-200 MG-UNIT per tablet Take 1 tablet by mouth 2 (two) times daily. 60 tablet 11  . levothyroxine (SYNTHROID, LEVOTHROID) 100 MCG tablet TAKE 1 TABLET EVERY DAY 90 tablet 1  . levothyroxine (SYNTHROID, LEVOTHROID) 112 MCG tablet levothyroxine sodium 112 mcg tabs    . LORazepam (ATIVAN) 0.5 MG tablet TAKE TWO TABLETS BY MOUTH EVERY 8 HOURS AS NEEDED FOR ANXIETY 60 tablet 5   No  current facility-administered medications on file prior to visit.     BP 132/80 (BP Location: Left Arm, Patient Position: Sitting, Cuff Size: Normal)   Pulse 89   Temp 98.2 F (36.8 C) (Oral)   Ht 5' (1.524 m)   Wt 111 lb 6.4 oz (50.5 kg)   SpO2 98%   BMI 21.76 kg/m    Review of Systems  Constitutional: Positive for activity change, chills and fatigue.  HENT: Positive for congestion, postnasal drip, sinus pain and sinus pressure. Negative for dental problem, hearing loss, rhinorrhea, sore throat and tinnitus.   Eyes: Negative for pain, discharge and visual disturbance.  Respiratory: Positive for cough. Negative for shortness of breath.   Cardiovascular: Negative for  chest pain, palpitations and leg swelling.  Gastrointestinal: Negative for abdominal distention, abdominal pain, blood in stool, constipation, diarrhea, nausea and vomiting.  Genitourinary: Negative for difficulty urinating, dysuria, flank pain, frequency, hematuria, pelvic pain, urgency, vaginal bleeding, vaginal discharge and vaginal pain.  Musculoskeletal: Negative for arthralgias, gait problem and joint swelling.  Skin: Negative for rash.  Neurological: Negative for dizziness, syncope, speech difficulty, weakness, numbness and headaches.  Hematological: Negative for adenopathy.  Psychiatric/Behavioral: Negative for agitation, behavioral problems and dysphoric mood. The patient is not nervous/anxious.        Objective:   Physical Exam  Constitutional: She is oriented to person, place, and time. She appears well-developed and well-nourished.  Appears unwell, but in no acute distress.  Afebrile   HENT:  Head: Normocephalic.  Right Ear: External ear normal.  Left Ear: External ear normal.  Mouth/Throat: Oropharynx is clear and moist.  Mild maxillary sinus tenderness  Eyes: Pupils are equal, round, and reactive to light. Conjunctivae and EOM are normal.  Neck: Normal range of motion. Neck supple. No thyromegaly present.  Cardiovascular: Normal rate, regular rhythm, normal heart sounds and intact distal pulses.   Pulmonary/Chest: Effort normal and breath sounds normal.  Abdominal: Soft. Bowel sounds are normal. She exhibits no mass. There is no tenderness.  Musculoskeletal: Normal range of motion.  Lymphadenopathy:    She has no cervical adenopathy.  Neurological: She is alert and oriented to person, place, and time.  Skin: Skin is warm and dry. No rash noted.  Psychiatric: She has a normal mood and affect. Her behavior is normal.          Assessment & Plan:   URI.  Possible subacute sinusitis.  Will continue with expectorants.  Hydration.  Treat with  azithromycin  Hypothyroidism.  We'll check a TSH ADHD.  Adderall prescriptions refilled  We'll postpone a flu vaccine for 1-2 weeks Note to return to work written  Renee [name]

## 2017-06-01 NOTE — Patient Instructions (Addendum)
    Use saline irrigation, warm  moist compresses and over-the-counter decongestants only as directed.  Call if there is no improvement in 5 to 7 days, or sooner if you develop increasing pain, fever, or any new symptoms.  Take your antibiotic as prescribed until ALL of it is gone, but stop if you develop a rash, swelling, or any side effects of the medication.  Contact our office as soon as possible if  there are side effects of the medication.  Follow these instructions at home: Medicines  Take, use, or apply over-the-counter and prescription medicines only as told by your health care provider. These may include nasal sprays.  If you were prescribed an antibiotic medicine, take it as told by your health care provider. Do not stop taking the antibiotic even if you start to feel better. Hydrate and Humidify  Drink enough water to keep your urine clear or pale yellow. Staying hydrated will help to thin your mucus.  Use a cool mist humidifier to keep the humidity level in your home above 50%.  Inhale steam for 10-15 minutes, 3-4 times a day or as told by your health care provider. You can do this in the bathroom while a hot shower is running.  Limit your exposure to cool or dry air. Rest  Rest as much as possible.

## 2017-07-05 ENCOUNTER — Ambulatory Visit (HOSPITAL_COMMUNITY)
Admission: RE | Admit: 2017-07-05 | Discharge: 2017-07-05 | Disposition: A | Discharge status: Home / Self Care | Payer: PRIVATE HEALTH INSURANCE | Source: Ambulatory Visit | Attending: Oncology | Admitting: Oncology

## 2017-07-05 ENCOUNTER — Other Ambulatory Visit (HOSPITAL_COMMUNITY): Payer: Self-pay | Admitting: Oncology

## 2017-07-05 ENCOUNTER — Encounter (HOSPITAL_COMMUNITY): Payer: Self-pay

## 2017-07-05 DIAGNOSIS — C50912 Malignant neoplasm of unspecified site of left female breast: Secondary | ICD-10-CM

## 2017-07-26 ENCOUNTER — Ambulatory Visit: Payer: Self-pay | Admitting: *Deleted

## 2017-07-26 NOTE — Telephone Encounter (Signed)
Patient BP this morning was taken with wrist machine  Reason for Disposition . [0] Systolic BP  >= 034 OR Diastolic >= 90 AND [9] taking BP medications  Answer Assessment - Initial Assessment Questions 1. BLOOD PRESSURE: "What is the blood pressure?" "Did you take at least two measurements 5 minutes apart?"     132/86 85   143/84  2. ONSET: "When did you take your blood pressure?"     Today at work 3. HOW: "How did you obtain the blood pressure?" (e.g., visiting nurse, automatic home BP monitor)     Automatic BP cuff 4. HISTORY: "Do you have a history of high blood pressure?"     no 5. MEDICATIONS: "Are you taking any medications for blood pressure?" "Have you missed any doses recently?"     no 6. OTHER SYMPTOMS: "Do you have any symptoms?" (e.g., headache, chest pain, blurred vision, difficulty breathing, weakness)     No- patient didn't feel right 7. PREGNANCY: "Is there any chance you are pregnant?" "When was your last menstrual period?"     n/a  Protocols used: HIGH BLOOD PRESSURE-A-AH

## 2017-07-29 ENCOUNTER — Encounter: Payer: Self-pay | Admitting: Family Medicine

## 2017-07-29 ENCOUNTER — Ambulatory Visit (INDEPENDENT_AMBULATORY_CARE_PROVIDER_SITE_OTHER): Payer: PRIVATE HEALTH INSURANCE | Admitting: Family Medicine

## 2017-07-29 VITALS — BP 116/80 | HR 92 | Temp 98.3°F | Wt 114.7 lb

## 2017-07-29 DIAGNOSIS — R079 Chest pain, unspecified: Secondary | ICD-10-CM

## 2017-07-29 DIAGNOSIS — F329 Major depressive disorder, single episode, unspecified: Secondary | ICD-10-CM | POA: Diagnosis not present

## 2017-07-29 DIAGNOSIS — F419 Anxiety disorder, unspecified: Secondary | ICD-10-CM | POA: Diagnosis not present

## 2017-07-29 DIAGNOSIS — R030 Elevated blood-pressure reading, without diagnosis of hypertension: Secondary | ICD-10-CM

## 2017-07-29 LAB — BASIC METABOLIC PANEL
BUN: 13 mg/dL (ref 6–23)
CHLORIDE: 101 meq/L (ref 96–112)
CO2: 29 mEq/L (ref 19–32)
Calcium: 9.3 mg/dL (ref 8.4–10.5)
Creatinine, Ser: 0.77 mg/dL (ref 0.40–1.20)
GFR: 85.1 mL/min (ref >60.00)
GLUCOSE: 65 mg/dL — AB (ref 70–99)
POTASSIUM: 4.1 meq/L (ref 3.5–5.1)
SODIUM: 137 meq/L (ref 135–145)

## 2017-07-29 NOTE — Patient Instructions (Addendum)
DASH Eating Plan DASH stands for "Dietary Approaches to Stop Hypertension." The DASH eating plan is a healthy eating plan that has been shown to reduce high blood pressure (hypertension). It may also reduce your risk for type 2 diabetes, heart disease, and stroke. The DASH eating plan may also help with weight loss. What are tips for following this plan? General guidelines  Avoid eating more than 2,300 mg (milligrams) of salt (sodium) a day. If you have hypertension, you may need to reduce your sodium intake to 1,500 mg a day.  Limit alcohol intake to no more than 1 drink a day for nonpregnant women and 2 drinks a day for men. One drink equals 12 oz of beer, 5 oz of wine, or 1 oz of hard liquor.  Work with your health care provider to maintain a healthy body weight or to lose weight. Ask what an ideal weight is for you.  Get at least 30 minutes of exercise that causes your heart to beat faster (aerobic exercise) most days of the week. Activities may include walking, swimming, or biking.  Work with your health care provider or diet and nutrition specialist (dietitian) to adjust your eating plan to your individual calorie needs. Reading food labels  Check food labels for the amount of sodium per serving. Choose foods with less than 5 percent of the Daily Value of sodium. Generally, foods with less than 300 mg of sodium per serving fit into this eating plan.  To find whole grains, look for the word "whole" as the first word in the ingredient list. Shopping  Buy products labeled as "low-sodium" or "no salt added."  Buy fresh foods. Avoid canned foods and premade or frozen meals. Cooking  Avoid adding salt when cooking. Use salt-free seasonings or herbs instead of table salt or sea salt. Check with your health care provider or pharmacist before using salt substitutes.  Do not fry foods. Cook foods using healthy methods such as baking, boiling, grilling, and broiling instead.  Cook with  heart-healthy oils, such as olive, canola, soybean, or sunflower oil. Meal planning   Eat a balanced diet that includes: ? 5 or more servings of fruits and vegetables each day. At each meal, try to fill half of your plate with fruits and vegetables. ? Up to 6-8 servings of whole grains each day. ? Less than 6 oz of lean meat, poultry, or fish each day. A 3-oz serving of meat is about the same size as a deck of cards. One egg equals 1 oz. ? 2 servings of low-fat dairy each day. ? A serving of nuts, seeds, or beans 5 times each week. ? Heart-healthy fats. Healthy fats called Omega-3 fatty acids are found in foods such as flaxseeds and coldwater fish, like sardines, salmon, and mackerel.  Limit how much you eat of the following: ? Canned or prepackaged foods. ? Food that is high in trans fat, such as fried foods. ? Food that is high in saturated fat, such as fatty meat. ? Sweets, desserts, sugary drinks, and other foods with added sugar. ? Full-fat dairy products.  Do not salt foods before eating.  Try to eat at least 2 vegetarian meals each week.  Eat more home-cooked food and less restaurant, buffet, and fast food.  When eating at a restaurant, ask that your food be prepared with less salt or no salt, if possible. What foods are recommended? The items listed may not be a complete list. Talk with your dietitian about what   dietary choices are best for you. Grains Whole-grain or whole-wheat bread. Whole-grain or whole-wheat pasta. Brown rice. Oatmeal. Quinoa. Bulgur. Whole-grain and low-sodium cereals. Pita bread. Low-fat, low-sodium crackers. Whole-wheat flour tortillas. Vegetables Fresh or frozen vegetables (raw, steamed, roasted, or grilled). Low-sodium or reduced-sodium tomato and vegetable juice. Low-sodium or reduced-sodium tomato sauce and tomato paste. Low-sodium or reduced-sodium canned vegetables. Fruits All fresh, dried, or frozen fruit. Canned fruit in natural juice (without  added sugar). Meat and other protein foods Skinless chicken or turkey. Ground chicken or turkey. Pork with fat trimmed off. Fish and seafood. Egg whites. Dried beans, peas, or lentils. Unsalted nuts, nut butters, and seeds. Unsalted canned beans. Lean cuts of beef with fat trimmed off. Low-sodium, lean deli meat. Dairy Low-fat (1%) or fat-free (skim) milk. Fat-free, low-fat, or reduced-fat cheeses. Nonfat, low-sodium ricotta or cottage cheese. Low-fat or nonfat yogurt. Low-fat, low-sodium cheese. Fats and oils Soft margarine without trans fats. Vegetable oil. Low-fat, reduced-fat, or light mayonnaise and salad dressings (reduced-sodium). Canola, safflower, olive, soybean, and sunflower oils. Avocado. Seasoning and other foods Herbs. Spices. Seasoning mixes without salt. Unsalted popcorn and pretzels. Fat-free sweets. What foods are not recommended? The items listed may not be a complete list. Talk with your dietitian about what dietary choices are best for you. Grains Baked goods made with fat, such as croissants, muffins, or some breads. Dry pasta or rice meal packs. Vegetables Creamed or fried vegetables. Vegetables in a cheese sauce. Regular canned vegetables (not low-sodium or reduced-sodium). Regular canned tomato sauce and paste (not low-sodium or reduced-sodium). Regular tomato and vegetable juice (not low-sodium or reduced-sodium). Pickles. Olives. Fruits Canned fruit in a light or heavy syrup. Fried fruit. Fruit in cream or butter sauce. Meat and other protein foods Fatty cuts of meat. Ribs. Fried meat. Bacon. Sausage. Bologna and other processed lunch meats. Salami. Fatback. Hotdogs. Bratwurst. Salted nuts and seeds. Canned beans with added salt. Canned or smoked fish. Whole eggs or egg yolks. Chicken or turkey with skin. Dairy Whole or 2% milk, cream, and half-and-half. Whole or full-fat cream cheese. Whole-fat or sweetened yogurt. Full-fat cheese. Nondairy creamers. Whipped toppings.  Processed cheese and cheese spreads. Fats and oils Butter. Stick margarine. Lard. Shortening. Ghee. Bacon fat. Tropical oils, such as coconut, palm kernel, or palm oil. Seasoning and other foods Salted popcorn and pretzels. Onion salt, garlic salt, seasoned salt, table salt, and sea salt. Worcestershire sauce. Tartar sauce. Barbecue sauce. Teriyaki sauce. Soy sauce, including reduced-sodium. Steak sauce. Canned and packaged gravies. Fish sauce. Oyster sauce. Cocktail sauce. Horseradish that you find on the shelf. Ketchup. Mustard. Meat flavorings and tenderizers. Bouillon cubes. Hot sauce and Tabasco sauce. Premade or packaged marinades. Premade or packaged taco seasonings. Relishes. Regular salad dressings. Where to find more information:  National Heart, Lung, and Blood Institute: www.nhlbi.nih.gov  American Heart Association: www.heart.org Summary  The DASH eating plan is a healthy eating plan that has been shown to reduce high blood pressure (hypertension). It may also reduce your risk for type 2 diabetes, heart disease, and stroke.  With the DASH eating plan, you should limit salt (sodium) intake to 2,300 mg a day. If you have hypertension, you may need to reduce your sodium intake to 1,500 mg a day.  When on the DASH eating plan, aim to eat more fresh fruits and vegetables, whole grains, lean proteins, low-fat dairy, and heart-healthy fats.  Work with your health care provider or diet and nutrition specialist (dietitian) to adjust your eating plan to your individual   calorie needs. This information is not intended to replace advice given to you by your health care provider. Make sure you discuss any questions you have with your health care provider. Document Released: 07/15/2011 Document Revised: 07/19/2016 Document Reviewed: 07/19/2016 Elsevier Interactive Patient Education  2018 [place].  Generalized Anxiety Disorder, Adult Generalized anxiety disorder (GAD) is a mental health  disorder. People with this condition constantly worry about everyday events. Unlike normal anxiety, worry related to GAD is not triggered by a specific event. These worries also do not fade or get better with time. GAD interferes with life functions, including relationships, work, and school. GAD can vary from mild to severe. People with severe GAD can have intense waves of anxiety with physical symptoms (panic attacks). What are the causes? The exact cause of GAD is not known. What increases the risk? This condition is more likely to develop in:  Women.  People who have a family history of anxiety disorders.  People who are very shy.  People who experience very stressful life events, such as the death of a loved one.  People who have a very stressful family environment.  What are the signs or symptoms? People with GAD often worry excessively about many things in their lives, such as their health and family. They may also be overly concerned about:  Doing well at work.  Being on time.  Natural disasters.  Friendships.  Physical symptoms of GAD include:  Fatigue.  Muscle tension or having muscle twitches.  Trembling or feeling shaky.  Being easily startled.  Feeling like your heart is pounding or racing.  Feeling out of breath or like you cannot take a deep breath.  Having trouble falling asleep or staying asleep.  Sweating.  Nausea, diarrhea, or irritable bowel syndrome (IBS).  Headaches.  Trouble concentrating or remembering facts.  Restlessness.  Irritability.  How is this diagnosed? Your health care provider can diagnose GAD based on your symptoms and medical history. You will also have a physical exam. The health care provider will ask specific questions about your symptoms, including how severe they are, when they started, and if they come and go. Your health care provider may ask you about your use of alcohol or drugs, including prescription medicines.  Your health care provider may refer you to a mental health specialist for further evaluation. Your health care provider will do a thorough examination and may perform additional tests to rule out other possible causes of your symptoms. To be diagnosed with GAD, a person must have anxiety that:  Is out of his or her control.  Affects several different aspects of his or her life, such as work and relationships.  Causes distress that makes him or her unable to take part in normal activities.  Includes at least three physical symptoms of GAD, such as restlessness, fatigue, trouble concentrating, irritability, muscle tension, or sleep problems.  Before your health care provider can confirm a diagnosis of GAD, these symptoms must be present more days than they are not, and they must last for six months or longer. How is this treated? The following therapies are usually used to treat GAD:  Medicine. Antidepressant medicine is usually prescribed for long-term daily control. Antianxiety medicines may be added in severe cases, especially when panic attacks occur.  Talk therapy (psychotherapy). Certain types of talk therapy can be helpful in treating GAD by providing support, education, and guidance. Options include: ? Cognitive behavioral therapy (CBT). People learn coping skills and techniques to ease  their anxiety. They learn to identify unrealistic or negative thoughts and behaviors and to replace them with positive ones. ? Acceptance and commitment therapy (ACT). This treatment teaches people how to be mindful as a way to cope with unwanted thoughts and feelings. ? Biofeedback. This process trains you to manage your body's response (physiological response) through breathing techniques and relaxation methods. You will work with a therapist while machines are used to monitor your physical symptoms.  Stress management techniques. These include yoga, meditation, and exercise.  A mental health  specialist can help determine which treatment is best for you. Some people see improvement with one type of therapy. However, other people require a combination of therapies. Follow these instructions at home:  Take over-the-counter and prescription medicines only as told by your health care provider.  Try to maintain a normal routine.  Try to anticipate stressful situations and allow extra time to manage them.  Practice any stress management or self-calming techniques as taught by your health care provider.  Do not punish yourself for setbacks or for not making progress.  Try to recognize your accomplishments, even if they are small.  Keep all follow-up visits as told by your health care provider. This is important. Contact a health care provider if:  Your symptoms do not get better.  Your symptoms get worse.  You have signs of depression, such as: ? A persistently sad, cranky, or irritable mood. ? Loss of enjoyment in activities that used to bring you joy. ? Change in weight or eating. ? Changes in sleeping habits. ? Avoiding friends or family members. ? Loss of energy for normal tasks. ? Feelings of guilt or worthlessness. Get help right away if:  You have serious thoughts about hurting yourself or others. If you ever feel like you may hurt yourself or others, or have thoughts about taking your own life, get help right away. You can go to your nearest emergency department or call:  Your local emergency services (911 in the [place]  A suicide crisis helpline, such as the [facility] at [phone]. This is open 24 hours a day.  Summary  Generalized anxiety disorder (GAD) is a mental health disorder that involves worry that is not triggered by a specific event.  People with GAD often worry excessively about many things in their lives, such as their health and family.  GAD may cause physical symptoms such as restlessness, trouble concentrating,  sleep problems, frequent sweating, nausea, diarrhea, headaches, and trembling or muscle twitching.  A mental health specialist can help determine which treatment is best for you. Some people see improvement with one type of therapy. However, other people require a combination of therapies. This information is not intended to replace advice given to you by your health care provider. Make sure you discuss any questions you have with your health care provider. Document Released: 11/20/2012 Document Revised: 06/15/2016 Document Reviewed: 06/15/2016 Elsevier Interactive Patient Education  2018 [place] With Depression Everyone experiences occasional disappointment, sadness, and loss in their lives. When you are feeling down, blue, or sad for at least 2 weeks in a row, it may mean that you have depression. Depression can affect your thoughts and feelings, relationships, daily activities, and physical health. It is caused by changes in the way your brain functions. If you receive a diagnosis of depression, your health care provider will tell you which type of depression you have and what treatment options are available to you. If you are  living with depression, there are ways to help you recover from it and also ways to prevent it from coming back. How to cope with lifestyle changes Coping with stress Stress is your body's reaction to life changes and events, both good and bad. Stressful situations may include:  Getting married.  The death of a spouse.  Losing a job.  Retiring.  Having a baby.  Stress can last just a few hours or it can be ongoing. Stress can play a major role in depression, so it is important to learn both how to cope with stress and how to think about it differently. Talk with your health care provider or a counselor if you would like to learn more about stress reduction. He or she may suggest some stress reduction techniques, such as:  Music therapy. This can  include creating music or listening to music. Choose music that you enjoy and that inspires you.  Mindfulness-based meditation. This kind of meditation can be done while sitting or walking. It involves being aware of your normal breaths, rather than trying to control your breathing.  Centering prayer. This is a kind of meditation that involves focusing on a spiritual word or phrase. Choose a word, phrase, or sacred image that is meaningful to you and that brings you peace.  Deep breathing. To do this, expand your stomach and inhale slowly through your nose. Hold your breath for 3-5 seconds, then exhale slowly, allowing your stomach muscles to relax.  Muscle relaxation. This involves intentionally tensing muscles then relaxing them.  Choose a stress reduction technique that fits your lifestyle and personality. Stress reduction techniques take time and practice to develop. Set aside 5-15 minutes a day to do them. Therapists can offer training in these techniques. The training may be covered by some insurance plans. Other things you can do to manage stress include:  Keeping a stress diary. This can help you learn what triggers your stress and ways to control your response.  Understanding what your limits are and saying no to requests or events that lead to a schedule that is too full.  Thinking about how you respond to certain situations. You may not be able to control everything, but you can control how you react.  Adding humor to your life by watching funny films or TV shows.  Making time for activities that help you relax and not feeling guilty about spending your time this way.  Medicines Your health care provider may suggest certain medicines if he or she feels that they will help improve your condition. Avoid using alcohol and other substances that may prevent your medicines from working properly (may interact). It is also important to:  Talk with your pharmacist or health care provider  about all the medicines that you take, their possible side effects, and what medicines are safe to take together.  Make it your goal to take part in all treatment decisions (shared decision-making). This includes giving input on the side effects of medicines. It is best if shared decision-making with your health care provider is part of your total treatment plan.  If your health care provider prescribes a medicine, you may not notice the full benefits of it for 4-8 weeks. Most people who are treated for depression need to be on medicine for at least 6-12 months after they feel better. If you are taking medicines as part of your treatment, do not stop taking medicines without first talking to your health care provider. You may need to  have the medicine slowly decreased (tapered) over time to decrease the risk of harmful side effects. Relationships Your health care provider may suggest family therapy along with individual therapy and drug therapy. While there may not be family problems that are causing you to feel depressed, it is still important to make sure your family learns as much as they can about your mental health. Having your family's support can help make your treatment successful. How to recognize changes in your condition Everyone has a different response to treatment for depression. Recovery from major depression happens when you have not had signs of major depression for two months. This may mean that you will start to:  Have more interest in doing activities.  Feel less hopeless than you did 2 months ago.  Have more energy.  Overeat less often, or have better or improving appetite.  Have better concentration.  Your health care provider will work with you to decide the next steps in your recovery. It is also important to recognize when your condition is getting worse. Watch for these signs:  Having fatigue or low energy.  Eating too much or too little.  Sleeping too much or too  little.  Feeling restless, agitated, or hopeless.  Having trouble concentrating or making decisions.  Having unexplained physical complaints.  Feeling irritable, angry, or aggressive.  Get help as soon as you or your family members notice these symptoms coming back. How to get support and help from others How to talk with friends and family members about your condition Talking to friends and family members about your condition can provide you with one way to get support and guidance. Reach out to trusted friends or family members, explain your symptoms to them, and let them know that you are working with a health care provider to treat your depression. Financial resources Not all insurance plans cover mental health care, so it is important to check with your insurance carrier. If paying for co-pays or counseling services is a problem, search for a local or county mental health care center. They may be able to offer public mental health care services at low or no cost when you are not able to see a private health care provider. If you are taking medicine for depression, you may be able to get the generic form, which may be less expensive. Some makers of prescription medicines also offer help to patients who cannot afford the medicines they need. Follow these instructions at home:  Get the right amount and quality of sleep.  Cut down on using caffeine, tobacco, alcohol, and other potentially harmful substances.  Try to exercise, such as walking or lifting small weights.  Take over-the-counter and prescription medicines only as told by your health care provider.  Eat a healthy diet that includes plenty of vegetables, fruits, whole grains, low-fat dairy products, and lean protein. Do not eat a lot of foods that are high in solid fats, added sugars, or salt.  Keep all follow-up visits as told by your health care provider. This is important. Contact a health care provider if:  You stop  taking your antidepressant medicines, and you have any of these symptoms: ? Nausea. ? Headache. ? Feeling lightheaded. ? Chills and body aches. ? Not being able to sleep (insomnia).  You or your friends and family think your depression is getting worse. Get help right away if:  You have thoughts of hurting yourself or others. If you ever feel like you may hurt yourself or  others, or have thoughts about taking your own life, get help right away. You can go to your nearest emergency department or call:  Your local emergency services (911 in the [place]  A suicide crisis helpline, such as the [facility] at (210)592-5042. This is open 24-hours a day.  Summary  If you are living with depression, there are ways to help you recover from it and also ways to prevent it from coming back.  Work with your health care team to create a management plan that includes counseling, stress management techniques, and healthy lifestyle habits. This information is not intended to replace advice given to you by your health care provider. Make sure you discuss any questions you have with your health care provider. Document Released: 06/28/2016 Document Revised: 06/28/2016 Document Reviewed: 06/28/2016 Elsevier Interactive Patient Education  [employer].

## 2017-07-29 NOTE — Progress Notes (Signed)
Subjective:    Patient ID: Renee [name], female    DOB: March 21, 1971, 46 y.o.   MRN: [id number]  No chief complaint on file.   HPI Patient was seen today for acute visit.  On Tuesday while at work patient felt like her contact was missing/something was off.  She had her blood pressure checked and it was 134/101.  While at home on Wednesday night patient endorses left breast/chest pain with left arm numbness/tingling.  Patient endorses nausea yesterday.  Pt did not take anything for her symptoms.  She is not currently on blood pressure medicine.  She states her blood pressure is usually low normal even on Adderall.  Pt does endorse recent increased stress stating her aunt died 2 weeks ago.  Pt denies alcohol, tobacco, drug use.  Pt runs for exercise, and eats mostly vegetables, little to no meat.  Past Medical History:  Diagnosis Date  . Acute medial meniscus tear of left knee 04/20/2016  . Breast cancer ([place])    2010 ; left side mastectomy chemo pill  . Depression   . DVT (deep venous thrombosis) ([facility])    left arm  . Hypothyroidism   . Osteopenia 09/01/2014  . PTSD (post-traumatic stress disorder) 08/15/2014  . Rupture of posterior cruciate ligament of left knee 04/20/2016  . 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 Thyroid disease     No Known Allergies  ROS General: Denies fever, chills, night sweats, changes in weight, changes in appetite  +elevated bp/"feeling off" HEENT: Denies headaches, ear pain, changes in vision, rhinorrhea, sore throat CV: Denies palpitations, SOB, orthopnea  +CP Pulm: Denies SOB, cough, wheezing GI: Denies abdominal pain, vomiting, diarrhea, constipation  +Nausea GU: Denies dysuria, hematuria, frequency, vaginal discharge Msk: Denies muscle cramps, joint pains Neuro: Denies weakness    + numbness, tingling in L arm Skin: Denies rashes, bruising Psych: Denies depression, anxiety, hallucinations     Objective:    Blood pressure 116/80, pulse 92,  temperature 98.3 F (36.8 C), temperature source Oral, weight 114 lb 11.2 oz (52 kg).   Gen. Pleasant, well-nourished, in no distress, normal affect  HEENT: [place]/AT, face symmetric, no scleral icterus, PERRLA, nares patent without drainage, pharynx without erythema or exudate. Lungs: no accessory muscle use, CTAB, no wheezes or rales Cardiovascular: RRR, no m/r/g, no peripheral edema Abdomen: BS present, soft, NT/ND Musculoskeletal: No TTP of chest.  No deformities, no cyanosis or clubbing, normal tone, no weakness Neuro:  A&Ox3, CN II-XII intact, normal gait   Wt Readings from Last 3 Encounters:  07/29/17 114 lb 11.2 oz (52 kg)  06/01/17 111 lb 6.4 oz (50.5 kg)  03/28/17 106 lb 8 oz (48.3 kg)    Assessment/Plan:  Elevated blood pressure reading  -Patient encouraged to obtain blood pressure monitor for home readings.  Patient to check BP daily, more frequently if feeling unwell. -Patient given handout on DASH diet -Lifestyle modifications encouraged. -Patient to follow-up in clinic in 1 week for BP recheck. - Plan: Basic metabolic panel, EKG 82-UMPN  Anxiety and depression -Continue Wellbutrin XL 150 mg -Patient encouraged to consider counseling services.  - Plan: EKG 12-Lead  Chest pain, unspecified type  -will obtain EKG and BMP given pt's risk factors: HLD, h/o malignancy - Plan: EKG 12-Lead -EKG obtained and reviewed in clinic.  No prior study for comparison.  Ventricular rate 80, no ischemic changes noted low voltage in lead I, abnormal R wave progression.  Sinus rhythm. -Patient given return to clinic or ED precautions  Follow-up in 1 week  Grier Mitts, MD

## 2017-08-31 ENCOUNTER — Other Ambulatory Visit: Payer: Self-pay | Admitting: Internal Medicine

## 2017-08-31 MED ORDER — ANASTROZOLE 1 MG PO TABS: 1.0000 mg | ORAL_TABLET | Freq: Every day | ORAL | 2 refills | Status: DC

## 2017-09-05 ENCOUNTER — Telehealth: Payer: Self-pay | Admitting: Internal Medicine

## 2017-09-05 NOTE — Telephone Encounter (Signed)
Copied from [place] [id number]. Topic: Quick Communication - See Telephone Encounter >> Sep 05, 2017  3:24 PM [name], NT wrote: CRM for notification. See Telephone encounter for: Pt requesting a refill of  amphetamine-dextroamphetamine (ADDERALL) to [facility] [address]   09/05/17.

## 2017-09-06 ENCOUNTER — Other Ambulatory Visit: Payer: Self-pay | Admitting: Family Medicine

## 2017-09-06 MED ORDER — AMPHETAMINE-DEXTROAMPHETAMINE 10 MG PO TABS: 10.0000 mg | ORAL_TABLET | Freq: Two times a day (BID) | ORAL | 0 refills | Status: DC

## 2017-09-06 NOTE — Telephone Encounter (Signed)
Rx was printed, awaiting on MD signature

## 2017-09-06 NOTE — Telephone Encounter (Signed)
[facility] calling stating the [facility]all cant be faxed or called in. It needs to be escribed or printed and the pt pick up.

## 2017-09-07 NOTE — Telephone Encounter (Signed)
Called pt and left a detailed voice message informing her that Rx is ready to be picked up at the front desk.

## 2017-09-09 NOTE — Telephone Encounter (Signed)
Caller name: Mickel Baas  Relation to pt: from [employer] (pharmacy Dispensing optician company)  Call back number: [phone] fax [phone] Attention [place]:  Reason for call:   Due to amphetamine-dextroamphetamine (ADDERALL) 10 MG tablet needing Prior Auth and patient being completely out insurance requesting information be expedited.   Requesting clinical notes, how long patient been on medication and what the medication is being prescribed for (Dx code) please fax to [phone] Attention Mickel Baas, please advise

## 2017-09-12 NOTE — Telephone Encounter (Signed)
Pt called to check status of the prior auth, pt is aware that Dr. Raliegh Ip is out, if pt needs to be contacted do so if possible, pt states insurance company has tried to contact office about the requested items, contact pt or insurance company if needed

## 2017-09-13 NOTE — Telephone Encounter (Signed)
Spoke w/ Mickel Baas and faxed requested information to [phone].

## 2017-09-13 NOTE — Telephone Encounter (Signed)
Patient said she needs this addressed asap. She said she is getting sick. Call back is [phone]

## 2017-09-13 NOTE — Telephone Encounter (Signed)
Called Mickel Baas back, [place] is approved through Feb. 2019. Mickel Baas will notify [facility].  Pt notified of instructions and verbalized understanding.

## 2017-09-13 NOTE — Telephone Encounter (Signed)
Copied from [place] [phone]. Topic: Quick Communication - See Telephone Encounter >> Sep 05, 2017  3:24 PM [name], NT wrote: CRM for notification. See Telephone encounter for: Pt requesting a refill of  amphetamine-dextroamphetamine ([facility]) [facility] [address]   09/05/17. >> Sep 12, 2017  1:57 PM Percell Belt A wrote: Pt called in said that Pharmacy should have sent a PA on this med.  Have you seen PA for this med?   [phone]- [place] >> Sep 13, 2017 10:54 AM Robina Ade, Helene Kelp D wrote: Patient called and want to know if she will get the PA for her Adderall and when will she get it since she has been out since 1/29. Please call patient back, thanks.

## 2017-09-27 ENCOUNTER — Other Ambulatory Visit (HOSPITAL_COMMUNITY): Payer: Self-pay | Admitting: *Deleted

## 2017-09-27 DIAGNOSIS — D508 Other iron deficiency anemias: Secondary | ICD-10-CM

## 2017-09-28 ENCOUNTER — Encounter (HOSPITAL_COMMUNITY): Payer: Self-pay | Admitting: *Deleted

## 2017-09-28 ENCOUNTER — Encounter (HOSPITAL_COMMUNITY): Payer: Self-pay | Admitting: Adult Health

## 2017-09-28 ENCOUNTER — Inpatient Hospital Stay (HOSPITAL_COMMUNITY): Payer: PRIVATE HEALTH INSURANCE

## 2017-09-28 ENCOUNTER — Other Ambulatory Visit: Payer: Self-pay

## 2017-09-28 ENCOUNTER — Ambulatory Visit (HOSPITAL_COMMUNITY): Payer: PRIVATE HEALTH INSURANCE

## 2017-09-28 ENCOUNTER — Other Ambulatory Visit (HOSPITAL_COMMUNITY): Payer: PRIVATE HEALTH INSURANCE

## 2017-09-28 ENCOUNTER — Inpatient Hospital Stay (HOSPITAL_COMMUNITY): Payer: PRIVATE HEALTH INSURANCE | Attending: Oncology | Admitting: Adult Health

## 2017-09-28 VITALS — BP 134/87 | HR 73 | Temp 98.5°F | Resp 18 | Wt 121.4 lb

## 2017-09-28 DIAGNOSIS — C50912 Malignant neoplasm of unspecified site of left female breast: Secondary | ICD-10-CM | POA: Diagnosis not present

## 2017-09-28 DIAGNOSIS — Z79811 Long term (current) use of aromatase inhibitors: Secondary | ICD-10-CM | POA: Diagnosis not present

## 2017-09-28 DIAGNOSIS — Z170 Estrogen receptor positive status [ER+]: Secondary | ICD-10-CM | POA: Diagnosis not present

## 2017-09-28 DIAGNOSIS — M8580 Other specified disorders of bone density and structure, unspecified site: Secondary | ICD-10-CM | POA: Insufficient documentation

## 2017-09-28 DIAGNOSIS — M85852 Other specified disorders of bone density and structure, left thigh: Secondary | ICD-10-CM

## 2017-09-28 DIAGNOSIS — D508 Other iron deficiency anemias: Secondary | ICD-10-CM

## 2017-09-28 LAB — COMPREHENSIVE METABOLIC PANEL
ALT: 14 U/L (ref 14–54)
AST: 22 U/L (ref 15–41)
Albumin: 3.9 g/dL (ref 3.5–5.0)
Alkaline Phosphatase: 72 U/L (ref 38–126)
Anion gap: 11 (ref 5–15)
BILIRUBIN TOTAL: 0.4 mg/dL (ref 0.3–1.2)
BUN: 15 mg/dL (ref 6–20)
CO2: 26 mmol/L (ref 22–32)
CREATININE: 0.98 mg/dL (ref 0.44–1.00)
Calcium: 9.4 mg/dL (ref 8.9–10.3)
Chloride: 98 mmol/L — ABNORMAL LOW (ref 101–111)
GFR calc Af Amer: >60 mL/min (ref >60)
Glucose, Bld: 93 mg/dL (ref 65–99)
Potassium: 3.7 mmol/L (ref 3.5–5.1)
Sodium: 135 mmol/L (ref 135–145)
TOTAL PROTEIN: 7.1 g/dL (ref 6.5–8.1)

## 2017-09-28 LAB — CBC WITH DIFFERENTIAL/PLATELET
BASOS ABS: 0 10*3/uL (ref 0.0–0.1)
Basophils Relative: 0 %
Eosinophils Absolute: 0.1 10*3/uL (ref 0.0–0.7)
Eosinophils Relative: 2 %
HEMATOCRIT: 39.3 % (ref 36.0–46.0)
Hemoglobin: 13 g/dL (ref 12.0–15.0)
LYMPHS PCT: 31 %
Lymphs Abs: 2.4 10*3/uL (ref 0.7–4.0)
MCH: 34.2 pg — ABNORMAL HIGH (ref 26.0–34.0)
MCHC: 33.1 g/dL (ref 30.0–36.0)
MCV: 103.4 fL — AB (ref 78.0–100.0)
MONO ABS: 0.7 10*3/uL (ref 0.1–1.0)
Monocytes Relative: 10 %
NEUTROS ABS: 4.3 10*3/uL (ref 1.7–7.7)
Neutrophils Relative %: 57 %
Platelets: 209 10*3/uL (ref 150–400)
RBC: 3.8 MIL/uL — ABNORMAL LOW (ref 3.87–5.11)
RDW: 13 % (ref 11.5–15.5)
WBC: 7.5 10*3/uL (ref 4.0–10.5)

## 2017-09-28 MED ORDER — DENOSUMAB 60 MG/ML ~~LOC~~ SOLN
60.0000 mg | Freq: Once | SUBCUTANEOUS | Status: AC
  Administered 2017-09-28: 60 mg via SUBCUTANEOUS
  Filled 2017-09-28: qty 1

## 2017-09-28 NOTE — Patient Instructions (Addendum)
[place] at [facility] Discharge Instructions  RECOMMENDATIONS MADE BY THE CONSULTANT AND ANY TEST RESULTS WILL BE SENT TO YOUR REFERRING PHYSICIAN.  You were seen today by Mike Craze, NP Follow up in 6 months with next Prolia injection See schedulers up front for appointments I have provided you with a constipation sheet.  Please call us if these do not work.   Thank you for choosing [place] at [facility] to provide your oncology and hematology care.  To afford each patient quality time with our provider, please arrive at least 15 minutes before your scheduled appointment time.    If you have a lab appointment with the [place] please come in thru the  Main Entrance and check in at the main information desk  You need to re-schedule your appointment should you arrive 10 or more minutes late.  We strive to give you quality time with our providers, and arriving late affects you and other patients whose appointments are after yours.  Also, if you no show three or more times for appointments you may be dismissed from the clinic at the providers discretion.     Again, thank you for choosing [facility].  Our hope is that these requests will decrease the amount of time that you wait before being seen by our physicians.       _____________________________________________________________  Should you have questions after your visit to [facility], please contact our office at [phone] between the hours of 8:30 a.m. and 4:30 p.m.  Voicemails left after 4:30 p.m. will not be returned until the following business day.  For prescription refill requests, have your pharmacy contact our office.       Resources For Cancer Patients and their Caregivers ? American Cancer Society: Can assist with transportation, wigs, general needs, runs Look Good Feel Better.        (931) 780-1268 ? Cancer Care: Provides  financial assistance, online support groups, medication/co-pay assistance.  1-800-813-HOPE 331-498-2217) ? [place] Assists [place] cancer patients and their families through emotional , educational and financial support.  (615) 671-5197 ? [place] DSS Where to apply for food stamps, Medicaid and utility assistance. (740) 687-6995 ? [facility]: Transportation to medical appointments. (323) 787-5559 ? Social Security Administration: May apply for disability if have a Stage IV cancer. (276) 867-0287 (346) 663-6567 ? [employer], Disability and Transit Services: Assists with nutrition, care and transit needs. [place] Support Programs: @10RELATIVEDAYS @ > Cancer Support Group  2nd Tuesday of the month 1pm-2pm, Journey Room  > Creative Journey  3rd Tuesday of the month 1130am-1pm, Journey Room  > Look Good Feel Better  1st Wednesday of the month 10am-12 noon, Journey Room (Call [place] to register [phone])

## 2017-09-28 NOTE — Patient Instructions (Signed)
[facility] at [facility] Discharge Instructions  RECOMMENDATIONS MADE BY THE CONSULTANT AND ANY TEST RESULTS WILL BE SENT TO YOUR REFERRING PHYSICIAN.  Received Prolia injection today. Follow-up as scheduled. Call clinic for any questions or concerns  Thank you for choosing [facility] at [facility] to provide your oncology and hematology care.  To afford each patient quality time with our provider, please arrive at least 15 minutes before your scheduled appointment time.    If you have a lab appointment with the Cancer Center please come in thru the  Main Entrance and check in at the main information desk  You need to re-schedule your appointment should you arrive 10 or more minutes late.  We strive to give you quality time with our providers, and arriving late affects you and other patients whose appointments are after yours.  Also, if you no show three or more times for appointments you may be dismissed from the clinic at the providers discretion.     Again, thank you for choosing [facility].  Our hope is that these requests will decrease the amount of time that you wait before being seen by our physicians.       _____________________________________________________________  Should you have questions after your visit to  Cancer Center, please contact our office at [phone] between the hours of 8:30 a.m. and 4:30 p.m.  Voicemails left after 4:30 p.m. will not be returned until the following business day.  For prescription refill requests, have your pharmacy contact our office.       Resources For Cancer Patients and their Caregivers ? American Cancer Society: Can assist with transportation, wigs, general needs, runs Look Good Feel Better.        1-888-227-6333 ? Cancer Care: Provides financial assistance, online support groups, medication/co-pay assistance.  1-800-813-HOPE (4673) ? [facility] Assists [place] cancer patients and their families through emotional , educational and financial support.  336-427-4357 ? [place] DSS Where to apply for food stamps, Medicaid and utility assistance. 336-342-1394 ? [employer]: Transportation to medical appointments. 336-347-2287 ? Social Security Administration: May apply for disability if have a Stage IV cancer. 336-342-7796 1-800-772-1213 ? [place] [facility], Disability and Transit Services: Assists with nutrition, care and transit needs. 336-349-2343  Cancer Center Support Programs: @10RELATIVEDAYS@ > Cancer Support Group  2nd Tuesday of the month 1pm-2pm, Journey Room  > Creative Journey  3rd Tuesday of the month 1130am-1pm, Journey Room  > Look Good Feel Better  1st Wednesday of the month 10am-12 noon, Journey Room (Call American Cancer Society to register 1-800-395-5775)   

## 2017-09-28 NOTE — Progress Notes (Signed)
Renee [name] tolerated Prolia injection well without complaints or incident. Calcium 9.4 and pt denied any tooth,jaw or leg pain or any recent or future dental visits prior to administering this medication. Pt discharged self ambulatory in satisfactory condition

## 2017-09-28 NOTE — Progress Notes (Signed)
[address], [place] [address]   CLINIC:  Medical Oncology/Hematology  PCP:  Marletta Lor, MD [address] 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  This patient is considered OSTEOPENIC according to [facility]) criteria.        HISTORY OF PRESENT ILLNESS:  (From Kirby Crigler, PA-C's last note on 09/27/16)     INTERVAL HISTORY:  Ms. Goodridge 47 y.o. female presents for routine follow-up for left breast cancer.    Here today unaccompanied.   Overall, she tells me she has been physically feeling well.  Appetite and energy levels both 100%.  She has been having some occasional constipation recently; she tries to manage this with her diet, but has not been successful in her efforts. She has not tried any OTC bowel regimen medications.    Denies any new breast concerns. Last screening mammogram to (R) breast in 06/2017 was negative. Remains on Arimidex with good tolerance. No hot flashes, vaginal dryness, or new arthralgias.   She tells me that she has been under quite a bit of stress and anxiety recently.  Her sister is being worked up for possible cancer, which she tells me is either multiple myeloma, lymphoma, or metastatic disease to the spine. She is struggling with how best to support her sister during this time.  When she thinks about her sister, she has intermittent chest tightness, sweating, and left arm tingling.  She thinks this is r/t anxiety. 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 Negative for chills and fever.  HENT:  Negative.   Eyes: Negative.   Respiratory: Negative.   Cardiovascular: Positive for chest pain.  Gastrointestinal: Positive for constipation.  Endocrine: Negative.   Genitourinary: Negative.  Negative for vaginal bleeding.   Musculoskeletal: Negative.   Hematological: Negative.   Psychiatric/Behavioral: The patient is nervous/anxious.      PAST MEDICAL/SURGICAL HISTORY:  Past Medical History:  Diagnosis Date  . Acute medial meniscus tear of left knee 04/20/2016  . Breast cancer ([place])    2010 ; left side mastectomy chemo pill  . Depression   . DVT (deep venous thrombosis) (HCC)    left arm  . Hypothyroidism   . Osteopenia 09/01/2014  . PTSD (post-traumatic stress disorder) 08/15/2014  . Rupture of posterior cruciate ligament of left knee 04/20/2016  . 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 Thyroid disease    Past Surgical History:  Procedure Laterality Date  . BREAST CAPSULECTOMY WITH IMPLANT EXCHANGE Left 08/19/2011  . BREAST RECONSTRUCTION Left 01/15/2011  . MINOR IRRIGATION AND DEBRIDEMENT OF WOUND Left 03/05/2011   back and left breast  . MODIFIED RADICAL  MASTECTOMY Left 10/08/2009  . PLACEMENT OF BREAST IMPLANTS Right 08/19/2011  . PORT-A-CATH REMOVAL  07/15/2010  . PORTACATH PLACEMENT  11/14/2009   with left breast wound revision     SOCIAL HISTORY:  Social History   Socioeconomic History  . Marital status: Divorced    Spouse name: Not on file  . Number of children: Not on file  . Years of education: Not on file  . Highest education level: Not on file  Social Needs  . Financial resource strain: Not on file  . Food insecurity - worry: Not on  file  . Food insecurity - inability: Not on file  . Transportation needs - medical: Not on file  . Transportation needs - non-medical: Not on file  Occupational History  . Not on file  Tobacco Use  . Smoking status: Former Smoker    Packs/day: 0.50    Years: 14.00    Pack years: 7.00    Last attempt to quit: 09/22/2010    Years since quitting: 7.0  . Smokeless tobacco: Never Used  Substance and Sexual Activity  . Alcohol use: No  . Drug use: No  . Sexual activity: Not on file  Other Topics Concern  . Not on file  Social History Narrative  . Not on file    FAMILY HISTORY:  Family History  Problem Relation Age of Onset  . Hypertension Mother   . Diabetes Father   . Stroke Father   . Hypertension Father   . Hypertension Maternal Grandmother   . Diabetes Paternal Grandmother   . Cancer Paternal Grandfather     CURRENT MEDICATIONS:  Outpatient Encounter Medications as of 09/28/2017  Medication Sig  . amphetamine-dextroamphetamine (ADDERALL) 10 MG tablet Take 1 tablet (10 mg total) by mouth 2 (two) times daily.  Marland Kitchen anastrozole (ARIMIDEX) 1 MG tablet Take 1 tablet (1 mg total) by mouth daily.  Marland Kitchen buPROPion (WELLBUTRIN XL) 150 MG 24 hr tablet Take 1 tablet (150 mg total) by mouth daily.  . calcium-vitamin D (OSCAL WITH D) 500-200 MG-UNIT per tablet Take 1 tablet by mouth 2 (two) times daily.  Marland Kitchen levothyroxine (SYNTHROID, LEVOTHROID) 100 MCG tablet TAKE 1 TABLET EVERY DAY  . LORazepam (ATIVAN) 0.5 MG tablet TAKE TWO TABLETS BY MOUTH EVERY 8 HOURS AS NEEDED FOR ANXIETY   No facility-administered encounter medications on file as of 09/28/2017.     ALLERGIES:  No Known Allergies   PHYSICAL EXAM:  ECOG Performance status: 1 - Symptomatic, but independent.   BP 134/87 Pulse 73 Resp 18 Temp 98.5 O2 sat 100%   Weight 121.4 lbs.    Physical Exam  Constitutional: She is oriented to person, place, and time and well-developed, well-nourished, and in no distress.  HENT:    Head: Normocephalic.  Mouth/Throat: Oropharynx is clear and moist. No oropharyngeal exudate.  Eyes: Conjunctivae are normal. Pupils are equal, round, and reactive to light. No scleral icterus.  Neck: Normal range of motion. Neck supple.  Cardiovascular: Normal rate, regular rhythm and normal heart sounds.  Pulmonary/Chest: Effort normal and breath sounds normal. No respiratory distress.    Abdominal: Soft. Bowel sounds are normal. There is no tenderness.  Musculoskeletal: Normal range of motion. She exhibits no edema.  Lymphadenopathy:    She has no cervical adenopathy.       Right: No supraclavicular adenopathy present.       Left: No supraclavicular adenopathy present.  Neurological: She is alert and oriented to person, place, and time. No cranial nerve  deficit. Gait normal.  Skin: Skin is warm and dry. No rash noted.  Psychiatric: Mood, memory, affect and judgment normal.  Nursing note and vitals reviewed.     LABORATORY DATA:  I have reviewed the labs as listed.  CBC    Component Value Date/Time   WBC 7.5 09/28/2017 1417   RBC 3.80 (L) 09/28/2017 1417   HGB 13.0 09/28/2017 1417   HCT 39.3 09/28/2017 1417   PLT 209 09/28/2017 1417   MCV 103.4 (H) 09/28/2017 1417   MCH 34.2 (H) 09/28/2017 1417   MCHC 33.1 09/28/2017 1417   RDW 13.0 09/28/2017 1417   LYMPHSABS 2.4 09/28/2017 1417   MONOABS 0.7 09/28/2017 1417   EOSABS 0.1 09/28/2017 1417   BASOSABS 0.0 09/28/2017 1417   CMP Latest Ref Rng & Units 09/28/2017 07/29/2017 03/28/2017  Glucose 65 - 99 mg/dL 93 65(L) 84  BUN 6 - 20 mg/dL _0 Creatinine 0.44 - 1.00 mg/dL 0.98 0.77 0.91  Sodium 135 - 145 mmol/L 135 137 134(L)  Potassium 3.5 - 5.1 mmol/L 3.7 4.1 3.5  Chloride 101 - 111 mmol/L 98(L) 101 101  CO2 22 - 32 mmol/L _1 Calcium 8.9 - 10.3 mg/dL 9.4 9.3 9.1  Total Protein 6.5 - 8.1 g/dL 7.1 - 6.6  Total Bilirubin 0.3 - 1.2 mg/dL 0.4 - 0.5  Alkaline Phos 38 - 126 U/L 72 - 57  AST 15 - 41 U/L 22 - 23   ALT 14 - 54 U/L 14 - 15    PENDING LABS:    DIAGNOSTIC IMAGING:  *The following radiologic images and reports have been reviewed independently and agree with below findings.  Last (R) breast screening mammogram w/implant: 07/05/17 CLINICAL DATA:  47 year old female for follow-up of 4 mm palpable mass in the outer right breast felt to be within the skin. The patient states she no longer feels this area. Right breast MRI dated 01/04/2017 was negative. The patient has a prior history of left breast cancer post mastectomy.  EXAM: 2D DIGITAL DIAGNOSTIC RIGHT MAMMOGRAM WITH IMPLANTS, CAD AND ADJUNCT TOMO  The patient has a subpectoral silicone implant. Standard and implant displaced views were performed.  COMPARISON:  Previous exam(s).  ACR Breast Density Category c: The breast tissue is heterogeneously dense, which may obscure small masses.  FINDINGS: No suspicious masses or calcifications are seen in the right breast. The previously seen 4 mm mass in the outer right breast is no longer identified and per the patient is no longer palpable there is no mammographic evidence of malignancy in the right breast.  Mammographic images were processed with CAD.  IMPRESSION: No mammographic evidence of malignancy in the right breast.  RECOMMENDATION: Screening mammogram in one year.(Code:[id number])  I have discussed the findings and recommendations with the patient. Results were also provided in writing at the conclusion of the visit. If applicable, a reminder letter will be sent to the patient regarding the next appointment.  BI-RADS CATEGORY  2: Benign.   Electronically Signed   By: Everlean Alstrom M.D.   On: 07/05/2017 10:53   PATHOLOGY:     ASSESSMENT & PLAN:   History of Stage IIIA left breast invasive ductal carcinoma, ER+/PR+/HER2-:  -Diagnosed in 10/2009 and treated with (L) mastectomy, adjuvant chemo with Epirubicin/Cytoxan x 4, followed by Taxol x  12. She went on to receive adjuvant breast radiation therapy. Started Tamoxifen in 06/2010 through 03/2014. At that time, she was switched to Arimidex given post-menopausal status. She went  on to have (L) breast latissimus dorsi flap breast reconstruction and placement of (R) breast implant for optimal cosmesis with Dr. Lyndee Leo Sanger Dillingham.   -Remains on Arimidex with good tolerance. Plan to continue for total of 10 years (through 06/2020). -Last (R) breast screening mammogram with implant done on 07/05/17 and negative. Will be due for annual exam in 06/2018; will place orders at next follow-up visit.  -Clinical breast exam performed today and benign.  -Return to cancer center in 6 months for follow-up with labs.    Bone health:  -Last DEXA scan in 10/2016 shows continued osteopenia. Biennial DEXA imaging will be due in 10/2018; will place orders at subsequent follow-up visit.  -Continue Prolia injections every 6 months; next injection due today as scheduled.  -Recommended continued calcium/vitamin D supplementation and weight-bearing exercises.    Constipation:  -Not likely d/t to any oncologic etiology or Arimidex.  -Encouraged her to try OTC agents to help with constipation, including Colace, Miralax, increased fiber, etc.  -Patient education written material re: constipation provided to her today as well.    Intermittent chest pain:  -Possibly d/t anxiety. However, discussed with her the importance of adequate work-up and follow-up for any chest pain with diaphoresis and (L) arm altered sensation ("tingling").  -Recommended EKG evaluation today. She declined.   -States that she has some Ativan at home, but when she takes a whole tab, she becomes too sleepy. Encouraged her to try 1/2 tab to see if she has symptomatic improvement.  -Gave her strict instructions to go to ED if she has subsequent episode of chest pain, diaphoresis, or (L) arm tingling/pain. Also recommended she follow-up  with her PCP soon to have these symptoms further evaluated. She agrees.   -Provided emotional support today with active listening, validation of her concerns, and expressive supportive counseling.      Dispo:  -Return to cancer center in 6 months for follow-up with labs and next Prolia injection.    All questions were answered to patient's stated satisfaction. Encouraged patient to call with any new concerns or questions before her next visit to the cancer center and we can certain see her sooner, if needed.    A total of 30 minutes was spent in face-to-face care of this patient, with greater than 50% of that time spent in counseling and care-coordination.    Orders placed this encounter:  Orders Placed This Encounter  Procedures  . CBC with Differential/Platelet  . Comprehensive metabolic panel      Mike Craze, NP [place] [phone]

## 2017-10-11 ENCOUNTER — Telehealth: Payer: Self-pay | Admitting: Internal Medicine

## 2017-10-11 NOTE — Telephone Encounter (Signed)
Copied from [place] [id number]. Topic: Quick Communication - Rx Refill/Question >> Oct 11, 2017  3:45 PM Wynetta Emery, [place] C wrote:   Medication: Adderall -[phone]    Has the patient contacted their pharmacy? no   (Agent: If no, request that the patient contact the pharmacy for the refill.)      Agent: Please be advised that RX refills may take up to 3 business days. We ask that you follow-up with your pharmacy.

## 2017-10-13 MED ORDER — AMPHETAMINE-DEXTROAMPHETAMINE 10 MG PO TABS: 10.0000 mg | ORAL_TABLET | Freq: Two times a day (BID) | ORAL | 0 refills | Status: DC

## 2017-10-13 NOTE — Telephone Encounter (Signed)
Renee [name], self Call back [phone]  Pt called in. She took last 2 doses today. She will not have medication for tomorrow. Please send adderall refill in for her.  [id number] [place], [place] [phone] (Phone) [phone] (Fax)

## 2017-10-14 MED ORDER — AMPHETAMINE-DEXTROAMPHETAMINE 10 MG PO TABS: 10.0000 mg | ORAL_TABLET | Freq: Two times a day (BID) | ORAL | 0 refills | Status: DC

## 2017-10-14 NOTE — Telephone Encounter (Signed)
Medication printed with error. Corrected medication printed.

## 2017-10-14 NOTE — Addendum Note (Signed)
Addended by: Gwenyth Ober R on: 10/14/2017 08:03 AM   Modules accepted: Orders

## 2017-10-14 NOTE — Telephone Encounter (Signed)
Rx was printed, signed, and filed at front desk. Patient picked up rx today.

## 2017-11-07 IMAGING — CR DG KNEE COMPLETE 4+V*L*
4 series · 4 of 4 positions shown · non-contrast
Comparison: None.

CLINICAL DATA: Left posterior knee pain.

EXAM:
LEFT KNEE - COMPLETE 4+ VIEW

[knee ap]
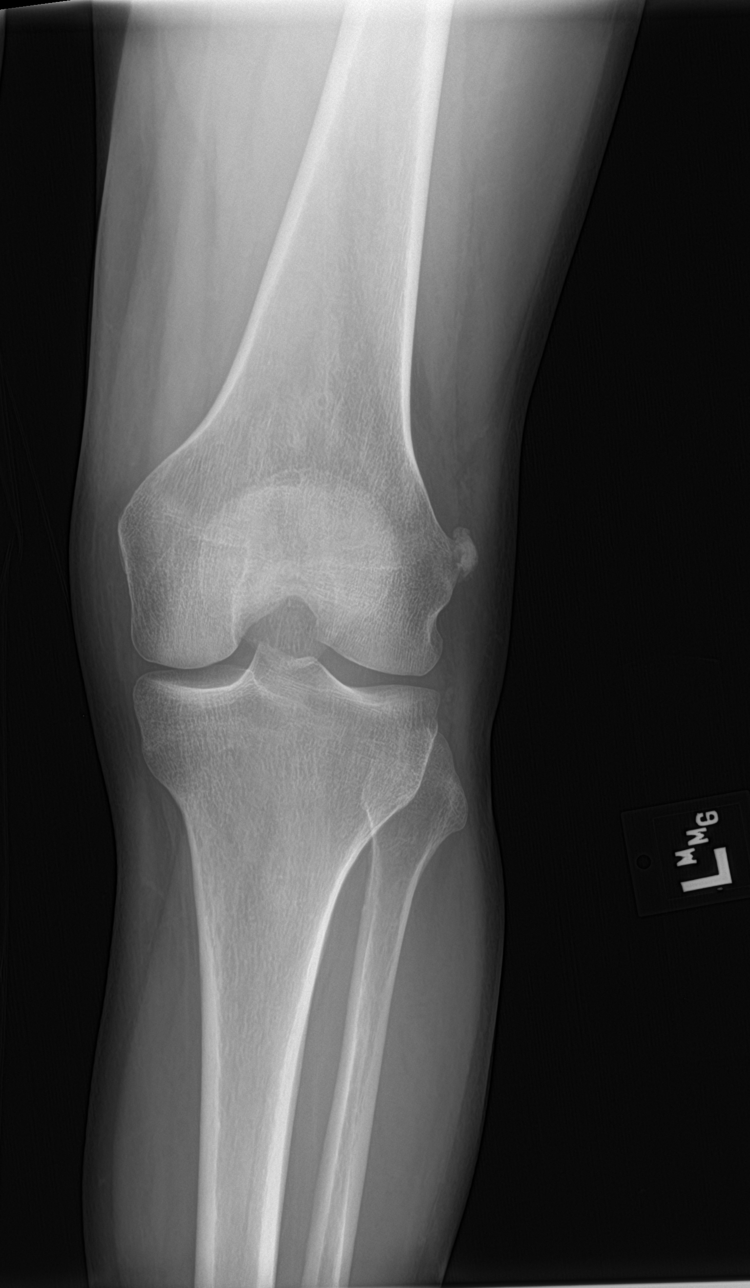

[knee lat]
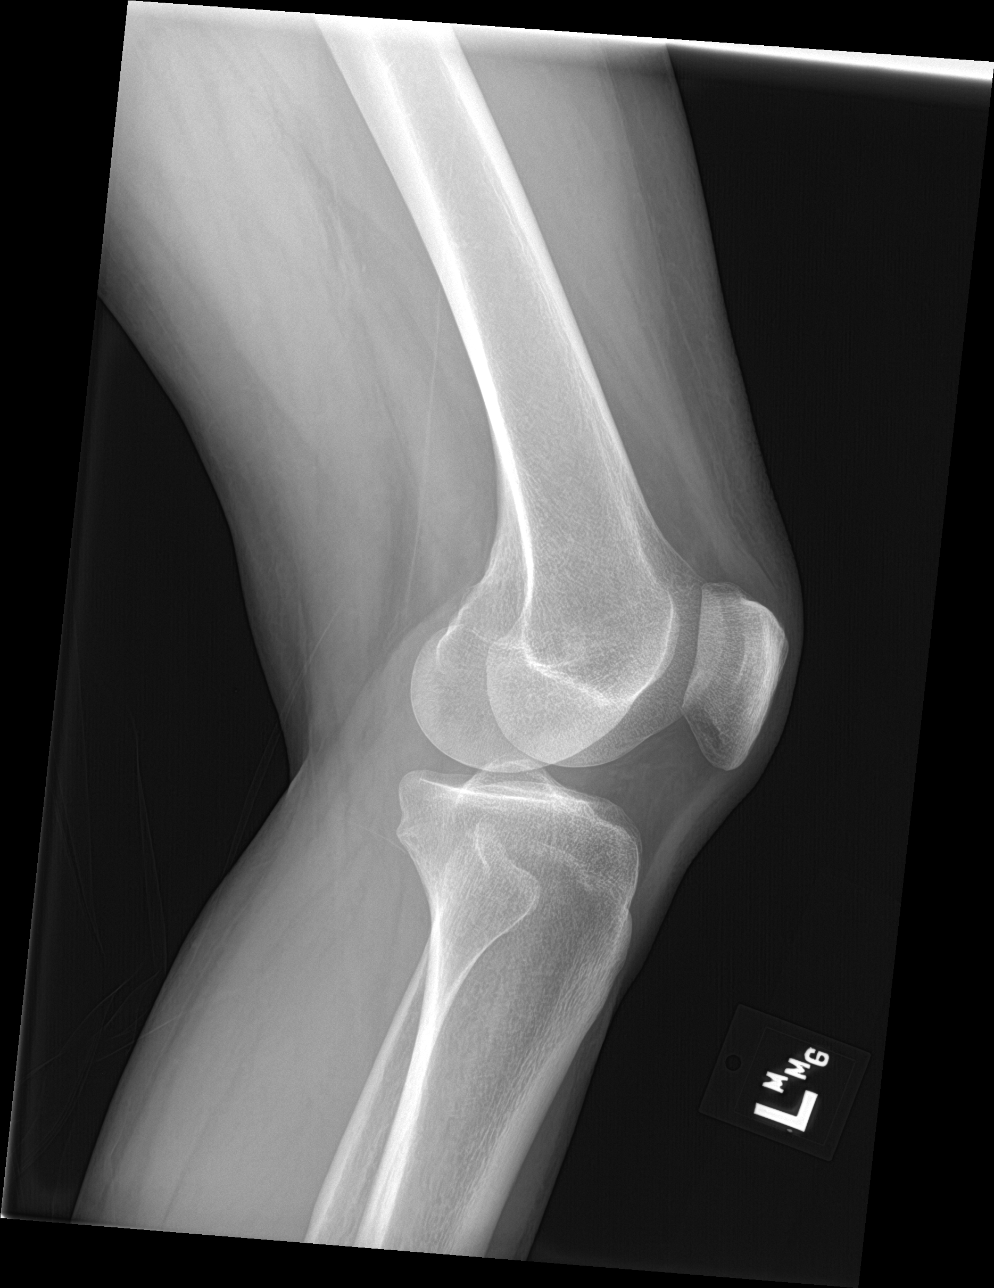

[knee obl (1 of 2)]
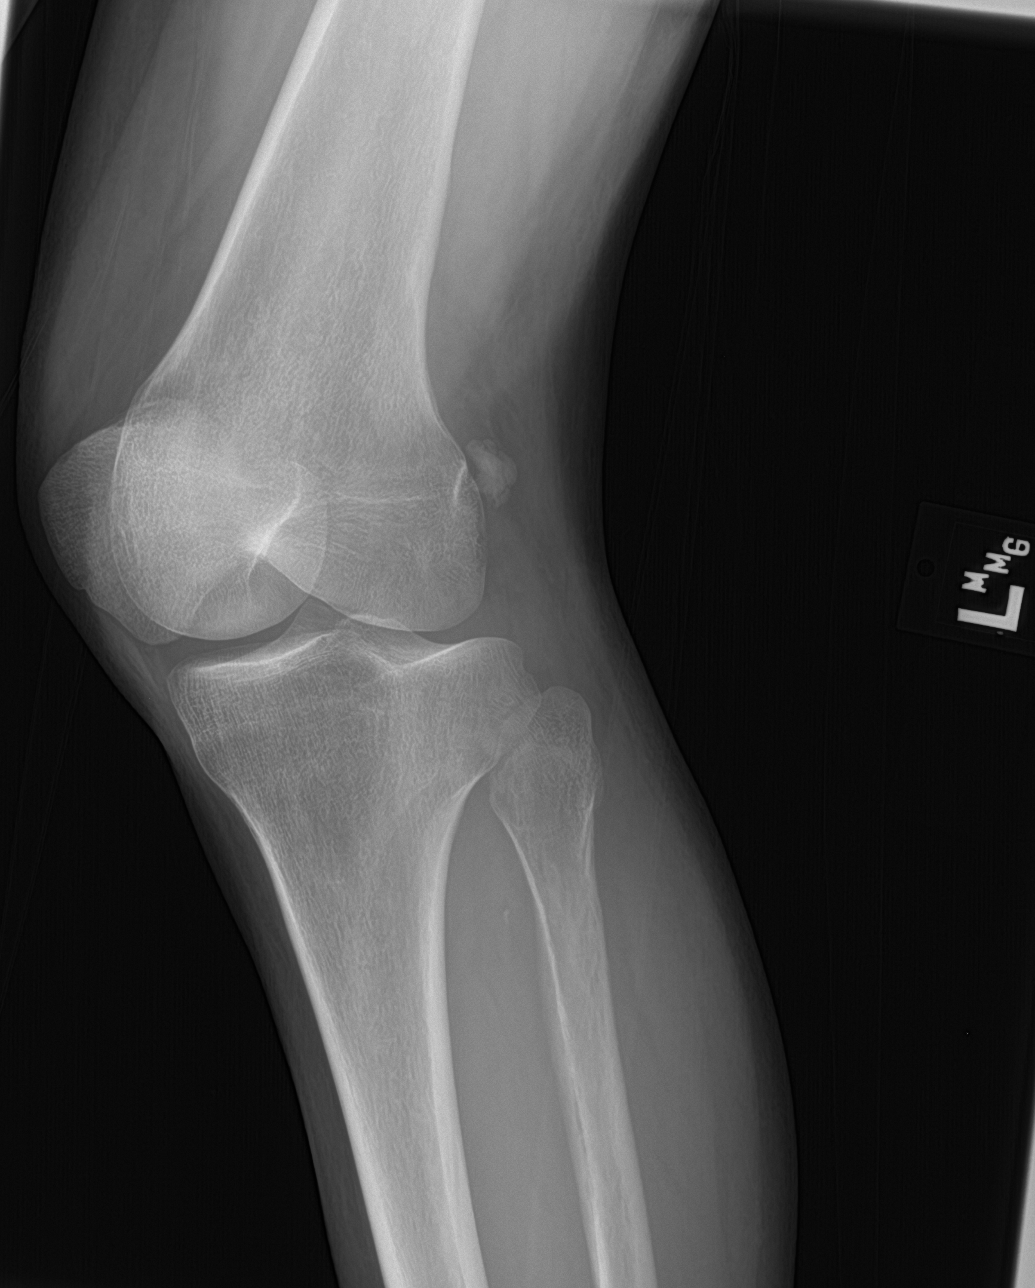

[knee obl (2 of 2)]
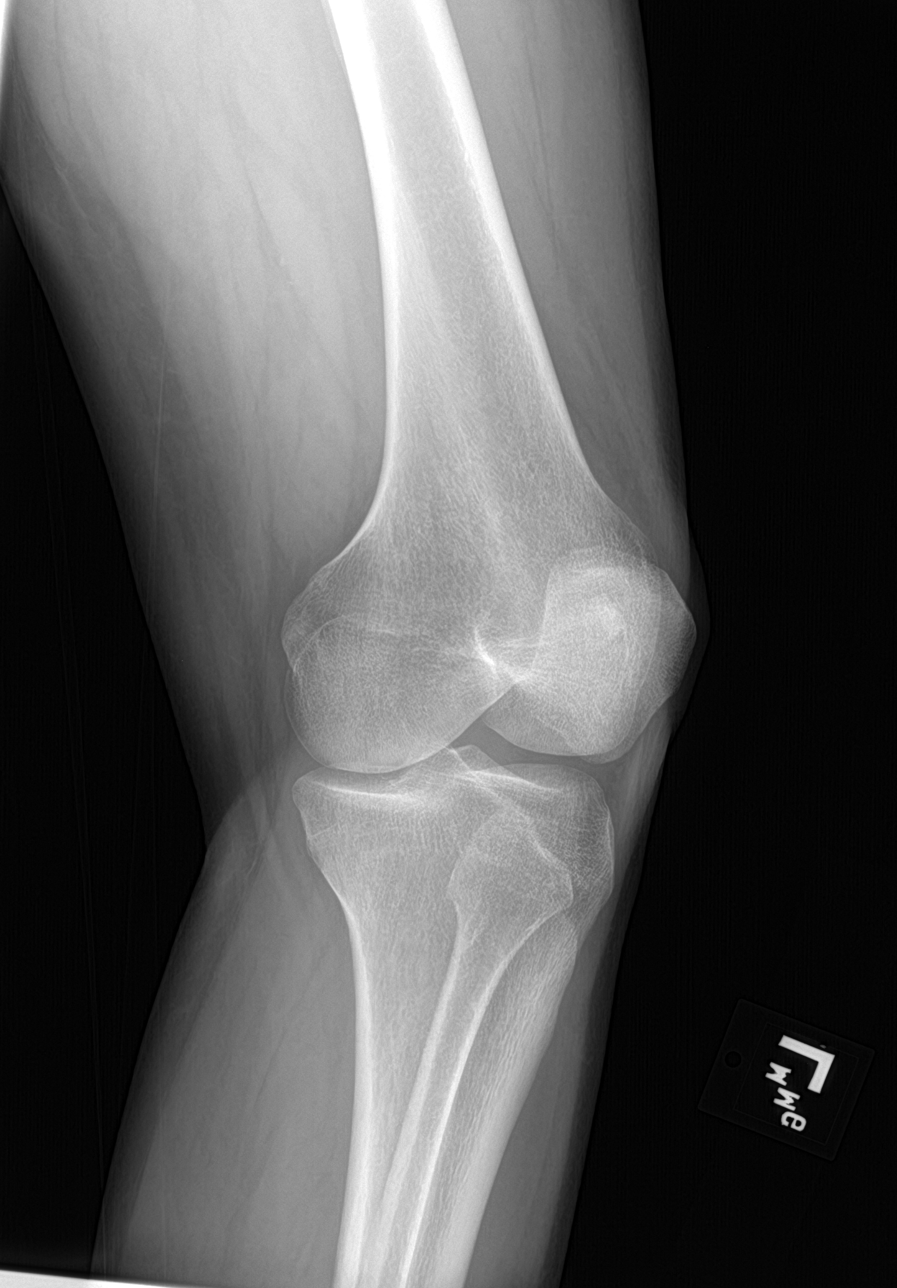

[4 of 4 positions shown; findings below may reference images not displayed]

FINDINGS: No evidence of fracture, dislocation, or joint effusion. Dystrophic
calcification adjacent to the medial femoral condyle likely
reflecting sequela prior MCL injury. Joint spaces are relatively
well maintained. Soft tissues are unremarkable.
IMPRESSION: No acute osseous injury of the left knee.

## 2017-11-09 ENCOUNTER — Other Ambulatory Visit: Payer: Self-pay | Admitting: Family Medicine

## 2017-11-09 NOTE — Telephone Encounter (Signed)
LOV:07/29/17 with Dr. Volanda Napoleon  Dr[facility] Burnice [facility] [place]

## 2017-11-09 NOTE — Telephone Encounter (Signed)
Copied from [place] [id number]. Topic: Quick Communication - Rx Refill/Question >> Nov 09, 2017  3:56 PM Selinda Flavin B, NT wrote: Medication: amphetamine-dextroamphetamine (ADDERALL) 10 MG tablet Has the patient contacted their pharmacy? Yes.   (Agent: If no, request that the patient contact the pharmacy for the refill.) Preferred Pharmacy (with phone number or street name): [id number] [place], [place] - [address] [phone] (Phone) [phone] (Fax)   Agent: Please be advised that RX refills may take up to 3 business days. We ask that you follow-up with your pharmacy.

## 2017-11-09 NOTE — Telephone Encounter (Signed)
Copied from [place] [id number]. Topic: Quick Communication - Rx Refill/Question >> Nov 09, 2017  3:56 PM Selinda Flavin B, NT wrote: Medication: amphetamine-dextroamphetamine (ADDERALL) 10 MG tablet Has the patient contacted their pharmacy? Yes.   (Agent: If no, request that the patient contact the pharmacy for the refill.) Preferred Pharmacy (with phone number or street name): [id number] [place], [place] - [address] [phone] (Phone) [phone] (Fax)   Agent: Please be advised that RX refills may take up to 3 business days. We ask that you follow-up with your pharmacy.

## 2017-11-10 ENCOUNTER — Telehealth: Payer: Self-pay

## 2017-11-10 MED ORDER — AMPHETAMINE-DEXTROAMPHETAMINE 10 MG PO TABS: 10.0000 mg | ORAL_TABLET | Freq: Two times a day (BID) | ORAL | 0 refills | Status: DC

## 2017-11-10 NOTE — Telephone Encounter (Signed)
This is the other one that Dr.Kwiatkowski forgot to sign.

## 2017-11-10 NOTE — Telephone Encounter (Signed)
  Aderall LOV: 07/29/17 PCP: [place] [place]    Copied from [place]. Topic: Quick Communication - Rx Refill/Question >> Nov 09, 2017  3:56 PM Selinda Flavin B, NT wrote: Medication: amphetamine-dextroamphetamine (ADDERALL) 10 MG tablet Has the patient contacted their pharmacy? Yes.   (Agent: If no, request that the patient contact the pharmacy for the refill.) Preferred Pharmacy (with phone number or street name): [id number] [place], [place] - [address] [phone] (Phone) [phone] (Fax)   Agent: Please be advised that RX refills may take up to 3 business days. We ask that you follow-up with your pharmacy.

## 2017-11-14 NOTE — Telephone Encounter (Signed)
I will let Dr. Raliegh Ip handle this

## 2017-11-28 ENCOUNTER — Ambulatory Visit (INDEPENDENT_AMBULATORY_CARE_PROVIDER_SITE_OTHER): Payer: PRIVATE HEALTH INSURANCE | Admitting: Internal Medicine

## 2017-11-28 ENCOUNTER — Encounter: Payer: Self-pay | Admitting: Internal Medicine

## 2017-11-28 VITALS — BP 100/80 | HR 94 | Temp 98.2°F | Wt 117.2 lb

## 2017-11-28 DIAGNOSIS — M21612 Bunion of left foot: Secondary | ICD-10-CM | POA: Diagnosis not present

## 2017-11-28 DIAGNOSIS — E890 Postprocedural hypothyroidism: Secondary | ICD-10-CM | POA: Diagnosis not present

## 2017-11-28 DIAGNOSIS — F900 Attention-deficit hyperactivity disorder, predominantly inattentive type: Secondary | ICD-10-CM | POA: Diagnosis not present

## 2017-11-28 MED ORDER — AMPHETAMINE-DEXTROAMPHETAMINE 10 MG PO TABS: 20.0000 mg | ORAL_TABLET | Freq: Two times a day (BID) | ORAL | 0 refills | Status: DC

## 2017-11-28 MED ORDER — AMPHETAMINE-DEXTROAMPHETAMINE 10 MG PO TABS
20.0000 mg | ORAL_TABLET | Freq: Two times a day (BID) | ORAL | 0 refills | Status: DC
Start: 2017-11-28 — End: 2017-11-28

## 2017-11-28 NOTE — Progress Notes (Signed)
Subjective:    Patient ID: Renee [name], female    DOB: 03/13/1971, 47 y.o.   MRN: [id number]  HPI 47 year old patient who is seen today for follow-up.  She has history of ADD which has been well controlled on short acting Adderall 10 mg twice daily.  She feels that some days she believes that has lost some of its effectiveness she has considerable stressors including a sister with metastatic breast cancer who is doing poorly with a very poor short-term prognosis;she also has significant job related stressors.  Past Medical History:  Diagnosis Date  . Acute medial meniscus tear of left knee 04/20/2016  . Breast cancer ([place])    2010 ; left side mastectomy chemo pill  . Depression   . DVT (deep venous thrombosis) (H[facility])    left arm  . Hypothyroidism   . Osteopenia 09/01/2014  . PTSD (post-traumatic stress disorder) 08/15/2014  . Rupture of posterior cruciate ligament of left knee 04/20/2016  . Severe major depression ([place]) 08/15/2014  . Thyroid disease      Social History   Socioeconomic History  . Marital status: Divorced    Spouse name: Not on file  . Number of children: Not on file  . Years of education: Not on file  . Highest education level: Not on file  Occupational History  . Not on file  Social Needs  . Financial resource strain: Not on file  . Food insecurity:    Worry: Not on file    Inability: Not on file  . Transportation needs:    Medical: Not on file    Non-medical: Not on file  Tobacco Use  . Smoking status: Former Smoker    Packs/day: 0.50    Years: 14.00    Pack years: 7.00    Last attempt to quit: 09/22/2010    Years since quitting: 7.1  . Smokeless tobacco: Never Used  Substance and Sexual Activity  . Alcohol use: No  . Drug use: No  . Sexual activity: Not on file  Lifestyle  . Physical activity:    Days per week: Not on file    Minutes per session: Not on file  . Stress: Not on file  Relationships  . Social connections:    Talks on phone: Not  on file    Gets together: Not on file    Attends religious service: Not on file    Active member of club or organization: Not on file    Attends meetings of clubs or organizations: Not on file    Relationship status: Not on file  . Intimate partner violence:    Fear of current or ex partner: Not on file    Emotionally abused: Not on file    Physically abused: Not on file    Forced sexual activity: Not on file  Other Topics Concern  . Not on file  Social History Narrative  . Not on file    Past Surgical History:  Procedure Laterality Date  . BREAST CAPSULECTOMY WITH IMPLANT EXCHANGE Left 08/19/2011  . BREAST RECONSTRUCTION Left 01/15/2011  . MINOR IRRIGATION AND DEBRIDEMENT OF WOUND Left 03/05/2011   back and left breast  . MODIFIED RADICAL MASTECTOMY Left 10/08/2009  . PLACEMENT OF BREAST IMPLANTS Right 08/19/2011  . PORT-A-CATH REMOVAL  07/15/2010  . PORTACATH PLACEMENT  11/14/2009   with left breast wound revision    Family History  Problem Relation Age of Onset  . Hypertension Mother   . Diabetes Father   .  Stroke Father   . Hypertension Father   . Hypertension Maternal Grandmother   . Diabetes Paternal Grandmother   . Cancer Paternal Grandfather     No Known Allergies  Current Outpatient Medications on File Prior to Visit  Medication Sig Dispense Refill  . anastrozole (ARIMIDEX) 1 MG tablet Take 1 tablet (1 mg total) by mouth daily. 90 tablet 2  . buPROPion (WELLBUTRIN XL) 150 MG 24 hr tablet Take 1 tablet (150 mg total) by mouth daily. 90 tablet 3  . calcium-vitamin D (OSCAL WITH D) 500-200 MG-UNIT per tablet Take 1 tablet by mouth 2 (two) times daily. 60 tablet 11  . levothyroxine (SYNTHROID, LEVOTHROID) 100 MCG tablet TAKE 1 TABLET EVERY DAY 90 tablet 1  . LORazepam (ATIVAN) 0.5 MG tablet TAKE TWO TABLETS BY MOUTH EVERY 8 HOURS AS NEEDED FOR ANXIETY 60 tablet 5   No current facility-administered medications on file prior to visit.     BP 100/80 (BP  Location: Right Arm, Patient Position: Sitting, Cuff Size: Normal)   Pulse 94   Temp 98.2 F (36.8 C) (Oral)   Wt 117 lb 3.2 oz (53.2 kg)   SpO2 98%   BMI 22.89 kg/m      Review of Systems  Constitutional: Positive for fatigue.  HENT: Negative for congestion, dental problem, hearing loss, rhinorrhea, sinus pressure, sore throat and tinnitus.   Eyes: Negative for pain, discharge and visual disturbance.  Respiratory: Negative for cough and shortness of breath.   Cardiovascular: Negative for chest pain, palpitations and leg swelling.  Gastrointestinal: Negative for abdominal distention, abdominal pain, blood in stool, constipation, diarrhea, nausea and vomiting.  Genitourinary: Negative for difficulty urinating, dysuria, flank pain, frequency, hematuria, pelvic pain, urgency, vaginal bleeding, vaginal discharge and vaginal pain.  Musculoskeletal: Negative for arthralgias, gait problem and joint swelling.  Skin: Negative for rash.  Neurological: Negative for dizziness, syncope, speech difficulty, weakness, numbness and headaches.  Hematological: Negative for adenopathy.  Psychiatric/Behavioral: Positive for decreased concentration and dysphoric mood. Negative for agitation and behavioral problems. The patient is nervous/anxious.        Objective:   Physical Exam  Constitutional: She is oriented to person, place, and time. She appears well-developed and well-nourished.  HENT:  Head: Normocephalic.  Right Ear: External ear normal.  Left Ear: External ear normal.  Mouth/Throat: Oropharynx is clear and moist.  Eyes: Pupils are equal, round, and reactive to light. Conjunctivae and EOM are normal.  Neck: Normal range of motion. Neck supple. No thyromegaly present.  Cardiovascular: Normal rate, regular rhythm, normal heart sounds and intact distal pulses.  Pulmonary/Chest: Effort normal and breath sounds normal.  Abdominal: Soft. Bowel sounds are normal. She exhibits no mass. There is no  tenderness.  Musculoskeletal: Normal range of motion.  Lymphadenopathy:    She has no cervical adenopathy.  Neurological: She is alert and oriented to person, place, and time.  Skin: Skin is warm and dry. No rash noted.  Psychiatric: She has a normal mood and affect. Her behavior is normal.          Assessment & Plan:   ADHD.  We will give a trial of increase Adderall to 20 mg twice daily.  Likely perceived ineffectiveness is more related to other external factors and stressors Hypothyroidism.  Last TSH therapeutic  Adderall refilled Follow-up 6 months with lab  [name]

## 2017-11-28 NOTE — Patient Instructions (Addendum)
Podiatry referral as discussed  Return in 6 months for follow-up with lab

## 2017-11-30 ENCOUNTER — Other Ambulatory Visit: Payer: Self-pay | Admitting: Internal Medicine

## 2017-11-30 NOTE — Telephone Encounter (Signed)
Copied from [place] [id number]. Topic: Quick Communication - Rx Refill/Question >> Nov 30, 2017  1:04 PM Ether Griffins B wrote: Medication: levothyroxine (SYNTHROID, LEVOTHROID) 100 MCG tablet                 LORazepam (ATIVAN) 0.5 MG tablet  Has the patient contacted their pharmacy? Yes.   (Agent: If no, request that the patient contact the pharmacy for the refill.)  Preferred Pharmacy (with phone number or street name): [id number] [place], [place]: Please be advised that RX refills may take up to 3 business days. We ask that you follow-up with your pharmacy.

## 2017-12-02 ENCOUNTER — Other Ambulatory Visit: Payer: Self-pay

## 2017-12-02 MED ORDER — LORAZEPAM 0.5 MG PO TABS: ORAL_TABLET | ORAL | 5 refills | Status: DC

## 2017-12-02 MED ORDER — LEVOTHYROXINE SODIUM 100 MCG PO TABS: 100.0000 ug | ORAL_TABLET | Freq: Every day | ORAL | 1 refills | Status: DC

## 2017-12-02 NOTE — Telephone Encounter (Signed)
levothyroxine refill Last OV: 11/28/17 Last Refill:05/30/17 #90 1RF [facility]acy[facility] [address] [address]  Lorazepam refill Last OV: 11/26/16  Last Refill:11/26/16

## 2017-12-02 NOTE — Addendum Note (Signed)
Addended by: Carlisle Beers on: 12/02/2017 09:31 AM   Modules accepted: Orders

## 2017-12-03 ENCOUNTER — Other Ambulatory Visit: Payer: Self-pay | Admitting: Internal Medicine

## 2017-12-12 ENCOUNTER — Encounter: Payer: Self-pay | Admitting: Genetic Counselor

## 2017-12-12 ENCOUNTER — Telehealth: Payer: Self-pay | Admitting: Genetic Counselor

## 2017-12-12 NOTE — Telephone Encounter (Signed)
Appt has been scheduled for the pt to see Roma Kayser on 6/3 at 2pm. Pt aware to arrive 30 minutes early. Letter mailed.

## 2017-12-14 ENCOUNTER — Encounter: Payer: Self-pay | Admitting: Podiatry

## 2017-12-14 ENCOUNTER — Ambulatory Visit: Payer: PRIVATE HEALTH INSURANCE | Admitting: Podiatry

## 2017-12-14 ENCOUNTER — Ambulatory Visit (INDEPENDENT_AMBULATORY_CARE_PROVIDER_SITE_OTHER): Payer: PRIVATE HEALTH INSURANCE

## 2017-12-14 DIAGNOSIS — M21619 Bunion of unspecified foot: Secondary | ICD-10-CM

## 2017-12-14 NOTE — Patient Instructions (Signed)
Pre-Operative Instructions  Congratulations, you have decided to take an important step towards improving your quality of life.  You can be assured that the doctors and staff at [facility] will be with you every step of the way.  Here are some important things you should know:  1. Plan to be at the surgery center/hospital at least 1 (one) hour prior to your scheduled time, unless otherwise directed by the surgical center/hospital staff.  You must have a responsible adult accompany you, remain during the surgery and drive you home.  Make sure you have directions to the surgical center/hospital to ensure you arrive on time. 2. If you are having surgery at [facility] or [facility], you will need a copy of your medical history and physical form from your family physician within one month prior to the date of surgery. We will give you a form for your primary physician to complete.  3. We make every effort to accommodate the date you request for surgery.  However, there are times where surgery dates or times have to be moved.  We will contact you as soon as possible if a change in schedule is required.   4. No aspirin/ibuprofen for one week before surgery.  If you are on aspirin, any non-steroidal anti-inflammatory medications (Mobic, Aleve, Ibuprofen) should not be taken seven (7) days prior to your surgery.  You make take Tylenol for pain prior to surgery.  5. Medications - If you are taking daily heart and blood pressure medications, seizure, reflux, allergy, asthma, anxiety, pain or diabetes medications, make sure you notify the surgery center/hospital before the day of surgery so they can tell you which medications you should take or avoid the day of surgery. 6. No food or drink after midnight the night before surgery unless directed otherwise by surgical center/hospital staff. 7. No alcoholic beverages 24-hours prior to surgery.  No smoking 24-hours prior or 24-hours after  surgery. 8. Wear loose pants or shorts. They should be loose enough to fit over bandages, boots, and casts. 9. Don't wear slip-on shoes. Sneakers are preferred. 10. Bring your boot with you to the surgery center/hospital.  Also bring crutches or a walker if your physician has prescribed it for you.  If you do not have this equipment, it will be provided for you after surgery. 11. If you have not been contacted by the surgery center/hospital by the day before your surgery, call to confirm the date and time of your surgery. 12. Leave-time from work may vary depending on the type of surgery you have.  Appropriate arrangements should be made prior to surgery with your employer. 13. Prescriptions will be provided immediately following surgery by your doctor.  Fill these as soon as possible after surgery and take the medication as directed. Pain medications will not be refilled on weekends and must be approved by the doctor. 14. Remove nail polish on the operative foot and avoid getting pedicures prior to surgery. 15. Wash the night before surgery.  The night before surgery wash the foot and leg well with water and the antibacterial soap provided. Be sure to pay special attention to beneath the toenails and in between the toes.  Wash for at least three (3) minutes. Rinse thoroughly with water and dry well with a towel.  Perform this wash unless told not to do so by your physician.  Enclosed: 1 Ice pack (please put in freezer the night before surgery)   1 Hibiclens skin cleaner     Pre-op instructions  If you have any questions regarding the instructions, please do not hesitate to call our office.  [facility]: [address], [place], [place] [address] -- [phone]  [facility]: [address] [place], [place] [address] -- [phone]  [facility]: [address]  [place], [place] [address] -- [phone]  [place]: [address], [address], [place], [place] [address] -- [phone]  Website:  [email or web address] 

## 2017-12-19 NOTE — Progress Notes (Signed)
   Subjective: 47 year old female presenting as a new patient with a chief complaint of a painful bunion deformity of the left foot that has been symptomatic for the past two years. Walking and wearing certain shoes increases the pain. She has not done anything for treatment. Patient is here for further evaluation and treatment.   Past Medical History:  Diagnosis Date  . Acute medial meniscus tear of left knee 04/20/2016  . Breast cancer ([place])    2010 ; left side mastectomy chemo pill  . Depression   . DVT (deep venous thrombosis) ([facility])    left arm  . Hypothyroidism   . Osteopenia 09/01/2014  . PTSD (post-traumatic stress disorder) 08/15/2014  . Rupture of posterior cruciate ligament of left knee 04/20/2016  . 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 Thyroid disease       Objective: Physical Exam General: The patient is alert and oriented x3 in no acute distress.  Dermatology: Skin is cool, dry and supple bilateral lower extremities. Negative for open lesions or macerations.  Vascular: Palpable pedal pulses bilaterally. No edema or erythema noted. Capillary refill within normal limits.  Neurological: Epicritic and protective threshold grossly intact bilaterally.   Musculoskeletal Exam: Clinical evidence of bunion deformity noted to the respective foot. There is a moderate pain on palpation range of motion of the first MPJ. Lateral deviation of the hallux noted consistent with hallux abductovalgus.  Radiographic Exam: Increased intermetatarsal angle greater than 15 with a hallux abductus angle greater than 30 noted on AP view. Moderate degenerative changes noted within the first MPJ.  Assessment: 1. HAV w/ bunion deformity left    Plan of Care:  1. Patient was evaluated. X-Rays reviewed. 2. Today we discussed the conservative versus surgical management of the presenting pathology. The patient opts for surgical management. All possible complications and details of the procedure  were explained. All patient questions were answered. No guarantees were expressed or implied. 3. Authorization for surgery was initiated today. Surgery will consist of bunionectomy with metatarsal osteotomy left. 4. Cam boot dispensed.  5. Return to clinic one week post op.   Wants to return to work one week after surgery.    Edrick Kins, DPM [facility]  Dr. Edrick Kins, [address]                                        [place], [place] [address]                Office [phone]  Fax [phone]

## 2018-01-09 ENCOUNTER — Inpatient Hospital Stay: Payer: PRIVATE HEALTH INSURANCE | Attending: Genetic Counselor | Admitting: Genetic Counselor

## 2018-01-09 ENCOUNTER — Inpatient Hospital Stay: Payer: PRIVATE HEALTH INSURANCE

## 2018-01-09 ENCOUNTER — Encounter: Payer: Self-pay | Admitting: Genetic Counselor

## 2018-01-09 ENCOUNTER — Encounter: Payer: PRIVATE HEALTH INSURANCE | Admitting: Genetic Counselor

## 2018-01-09 ENCOUNTER — Other Ambulatory Visit: Payer: PRIVATE HEALTH INSURANCE

## 2018-01-09 DIAGNOSIS — Z9221 Personal history of antineoplastic chemotherapy: Secondary | ICD-10-CM

## 2018-01-09 DIAGNOSIS — Z9012 Acquired absence of left breast and nipple: Secondary | ICD-10-CM | POA: Diagnosis not present

## 2018-01-09 DIAGNOSIS — Z79811 Long term (current) use of aromatase inhibitors: Secondary | ICD-10-CM

## 2018-01-09 DIAGNOSIS — Z87891 Personal history of nicotine dependence: Secondary | ICD-10-CM

## 2018-01-09 DIAGNOSIS — Z853 Personal history of malignant neoplasm of breast: Secondary | ICD-10-CM

## 2018-01-09 DIAGNOSIS — D508 Other iron deficiency anemias: Secondary | ICD-10-CM

## 2018-01-09 DIAGNOSIS — Z803 Family history of malignant neoplasm of breast: Secondary | ICD-10-CM | POA: Insufficient documentation

## 2018-01-09 LAB — COMPREHENSIVE METABOLIC PANEL
ALBUMIN: 3.8 g/dL (ref 3.5–5.0)
ALT: 20 U/L (ref 0–55)
AST: 34 U/L (ref 5–34)
Alkaline Phosphatase: 71 U/L (ref 40–150)
Anion gap: 12 — ABNORMAL HIGH (ref 3–11)
BUN: 11 mg/dL (ref 7–26)
CHLORIDE: 103 mmol/L (ref 98–109)
CO2: 24 mmol/L (ref 22–29)
CREATININE: 0.75 mg/dL (ref 0.60–1.10)
Calcium: 9.4 mg/dL (ref 8.4–10.4)
GFR calc Af Amer: >60 mL/min (ref >60)
Glucose, Bld: 80 mg/dL (ref 70–140)
POTASSIUM: 3.7 mmol/L (ref 3.5–5.1)
SODIUM: 139 mmol/L (ref 136–145)
Total Bilirubin: 0.2 mg/dL (ref 0.2–1.2)
Total Protein: 7 g/dL (ref 6.4–8.3)

## 2018-01-09 LAB — CBC WITH DIFFERENTIAL/PLATELET
Basophils Absolute: 0 10*3/uL (ref 0.0–0.1)
Basophils Relative: 1 %
EOS PCT: 2 %
Eosinophils Absolute: 0.1 10*3/uL (ref 0.0–0.5)
HCT: 40.5 % (ref 34.8–46.6)
Hemoglobin: 13.9 g/dL (ref 11.6–15.9)
LYMPHS ABS: 2 10*3/uL (ref 0.9–3.3)
LYMPHS PCT: 29 %
MCH: 35.5 pg — AB (ref 25.1–34.0)
MCHC: 34.2 g/dL (ref 31.5–36.0)
MCV: 103.8 fL — AB (ref 79.5–101.0)
MONOS PCT: 9 %
Monocytes Absolute: 0.6 10*3/uL (ref 0.1–0.9)
Neutro Abs: 4.1 10*3/uL (ref 1.5–6.5)
Neutrophils Relative %: 59 %
PLATELETS: 202 10*3/uL (ref 145–400)
RBC: 3.9 MIL/uL (ref 3.70–5.45)
RDW: 13.5 % (ref 11.2–14.5)
WBC: 6.8 10*3/uL (ref 3.9–10.3)

## 2018-01-09 NOTE — Progress Notes (Signed)
[facility]      Initial Visit   Patient Name: Renee [name] Patient DOB: [birth year] Patient Age: 47 y.o. Encounter Date: 01/09/2018  Referring Provider: Self-referred  Primary Care Provider: Marletta Lor, MD  Reason for Visit: Evaluate for hereditary susceptibility to cancer    Assessment and Plan:  . 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   Marland Kitchen Testing is recommended to confirm this as this information will impact her screening and risk-reduction for cancer. She was given a copy of the latest NCCN guidelines for women with a PALB2 mutation. We will discuss these in more detail when results are available.  . [name] wished to pursue genetic testing and a blood sample will be sent for analysis of the 83 genes on [facility]'s Multi-Cancer panel (ALK, APC, ATM, AXIN2, BAP1, BARD1, BLM, BMPR1A, BRCA1, BRCA2, BRIP1, CASR, CDC73, CDH1, CDK4, CDKN1B, CDKN1C, CDKN2A, CEBPA, CHEK2, CTNNA1, DICER1, DIS3L2, EGFR, EPCAM, FH, FLCN, GATA2, GPC3, GREM1, HOXB13, HRAS, KIT, MAX, MEN1, MET, MITF, MLH1, MSH2, MSH3, MSH6, MUTYH, NBN, NF1, NF2, NTHL1, PALB2, PDGFRA, PHOX2B, PMS2, POLD1, POLE, POT1, PRKAR1A, PTCH1, PTEN, RAD50, RAD51C, RAD51D, RB1, RECQL4, RET, RUNX1, SDHA, SDHAF2, SDHB, SDHC, SDHD, SMAD4, SMARCA4, SMARCB1, SMARCE1, STK11, SUFU, TERC, TERT, TMEM127, TP53, TSC1, TSC2, VHL, WRN, WT1).   . Results should be available in approximately 2-3 weeks, at which point we will contact her and address implications for her as well as address genetic testing for at-risk family members, if needed.     Dr. Jana [name] was available for questions concerning this case. Total time spent by me in face-to-face counseling was approximately 30 minutes.   _____________________________________________________________________   History of Present Illness: [name], a 47  y.o. 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 She presents to clinic today with her daughters, Renee [name] and Renee [name], to discuss the implications of this for her and to undergo genetic testing.  [name] was diagnosed with breast cancer at the age of 52. She is s/p left mastectomy, chemotherapy and Tamoxifen. She is now on anastrozole. The breast tumor was ER positive, PR positive, and HER2 negative.  Her sister pursued genetic testing due to her diagnosis of triple negative breast cancer. 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 A Variant of Uncertain Significance in CEBPA was also found, but that is not clinically relevant at this time.    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  This patient is considered OSTEOPENIC according to [name]) criteria.       Past Medical History:  Diagnosis Date  . Acute medial meniscus tear of left knee 04/20/2016  . Breast cancer ([place])    2010 ; left side mastectomy chemo pill  . Depression   . DVT (deep venous thrombosis) (HCC)    left arm  . Family history of breast cancer in sister    sister with PALB2 mutation  . Hypothyroidism   . Osteopenia 09/01/2014  .  PTSD (post-traumatic stress disorder) 08/15/2014  . Rupture of posterior cruciate ligament of left knee 04/20/2016  . 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 Thyroid disease     Past Surgical History:  Procedure Laterality Date  . BREAST CAPSULECTOMY WITH IMPLANT EXCHANGE Left 08/19/2011  . BREAST RECONSTRUCTION Left 01/15/2011  . MINOR IRRIGATION AND DEBRIDEMENT OF WOUND Left 03/05/2011   back  and left breast  . MODIFIED RADICAL MASTECTOMY Left 10/08/2009  . PLACEMENT OF BREAST IMPLANTS Right 08/19/2011  . PORT-A-CATH REMOVAL  07/15/2010  . PORTACATH PLACEMENT  11/14/2009   with left breast wound revision    Social History   Socioeconomic History  . Marital status: Divorced    Spouse name: Not on file  . Number of children: Not on file  . Years of education: Not on file  . Highest education level: Not on file  Occupational History  . Not on file  Social Needs  . Financial resource strain: Not on file  . Food insecurity:    Worry: Not on file    Inability: Not on file  . Transportation needs:    Medical: Not on file    Non-medical: Not on file  Tobacco Use  . Smoking status: Former Smoker    Packs/day: 0.50    Years: 14.00    Pack years: 7.00    Last attempt to quit: 09/22/2010    Years since quitting: 7.3  . Smokeless tobacco: Never Used  Substance and Sexual Activity  . Alcohol use: No  . Drug use: No  . Sexual activity: Not on file  Lifestyle  . Physical activity:    Days per week: Not on file    Minutes per session: Not on file  . Stress: Not on file  Relationships  . Social connections:    Talks on phone: Not on file    Gets together: Not on file    Attends religious service: Not on file    Active member of club or organization: Not on file    Attends meetings of clubs or organizations: Not on file    Relationship status: Not on file  Other Topics Concern  . Not on file  Social History Narrative  . Not on file     Family History:  During the visit, a 4-generation pedigree was obtained. Family tree will be scanned in the Media tab in Epic  Significant diagnoses include the following:  Family History  Problem Relation Age of Onset  . Hypertension Mother   . Diabetes Father   . Stroke Father   . Hypertension Father   . Hypertension Maternal Grandmother   . Diabetes Paternal Grandmother   . Lung cancer Paternal Grandfather         deceased 17s; smoker  . Breast cancer Sister 38       currently 39; PALB2 mutation    Additionally, [name] has 2 daughters (ages 37 and 60) who are being evaluated today in Genetics. She has a brother (age 52) in addition to her sister noted above. He has not pursued genetic testing. Her mother (age 67) was also seen today in Genetics. Her mother is cancer-free and had 2 brothers and a sister. Her father (age 38) is cancer-free. He had one sister who died at 63, cancer-free.  Ms. Deprey ancestry is Caucasian - NOS. There is no known Jewish ancestry and no consanguinity.  Discussion: We reviewed the characteristics, features and inheritance patterns of hereditary cancer syndromes. We discussed her  risk of harboring the PALB2 mutation or any other mutation in the context of her personal and family history. We discussed the process of genetic testing, insurance coverage and implications of results: positive, negative and variant of unknown significance (VUS).   PALB2 Cancer Risks: It is important to note that the studies on cancer risk associated with the PALB2 gene are generally limited. For this reason, the cancer risk estimates below are likely to change as new data is obtained about PALB2 pathogenic mutations. We discussed that pathogenic mutations in PALB2 have been shown to increase the risk of breast cancer to 33-58% by age 71 depending on the research study. PALB2 mutations have also been associated with an increased risk of pancreatic cancer and possibly ovarian cancer, but specific risk estimates are not fully defined yet. Men with a PALB2 mutation may have an increased risk of female breast cancer and prostate cancer, but data is yet preliminary.    We discussed the recommended cancer screenings for women with a PALB2 mutation as outlined in the [employer]  and gave her a copy.    Ms. Muckey questions were answered to her satisfaction today and she is  welcome to call with any additional questions or concerns. Thank you for the referral and allowing [place] to share in the care of your patient.    Steele Berg, MS, [place] Certified Genetic Counselor phone: [phone] [email or web address]

## 2018-01-22 ENCOUNTER — Encounter: Payer: Self-pay | Admitting: Genetic Counselor

## 2018-01-22 DIAGNOSIS — Z1509 Genetic susceptibility to other malignant neoplasm: Secondary | ICD-10-CM

## 2018-01-22 DIAGNOSIS — Z1501 Genetic susceptibility to malignant neoplasm of breast: Secondary | ICD-10-CM | POA: Insufficient documentation

## 2018-01-22 DIAGNOSIS — Z1379 Encounter for other screening for genetic and chromosomal anomalies: Secondary | ICD-10-CM | POA: Insufficient documentation

## 2018-01-22 DIAGNOSIS — Z1589 Genetic susceptibility to other disease: Secondary | ICD-10-CM | POA: Insufficient documentation

## 2018-01-22 NOTE — Progress Notes (Signed)
[facility]         Results Disclosure   Patient Name: Renee [name] Patient DOB: 1970/10/11 Patient Age: 47 y.o. Encounter Date: 01/23/2018  Referring Provider: Self-referred  Reason for Call: Discuss results of genetic testing   Renee [name] was seen in the [facility] on 01/09/18 due to a personal and family history of cancer and a recently identified PALB2 mutation in the family. Please refer to the prior Genetics clinic note for more information regarding Renee [name] medical and family histories and our assessment at the time.   Genetic Test Results: At the time of [name] visit, she chose to pursue genetic testing of multiple genes associated with hereditary susceptibility to cancer to determine whether she has the familial PALB2 mutation or any other mutation. This test included sequencing and deletion/duplication analysis. 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  A copy of the genetic test report will be scanned into Epic under the media tab.  The genes on the panel were the 83 genes on [facility]'s Multi-Cancer panel (ALK, APC, ATM, AXIN2, BAP1, BARD1, BLM, BMPR1A, BRCA1, BRCA2, BRIP1, CASR, CDC73, CDH1, CDK4, CDKN1B, CDKN1C, CDKN2A, CEBPA, CHEK2, CTNNA1, DICER1, DIS3L2, EGFR, EPCAM, FH, FLCN, GATA2, GPC3, GREM1, HOXB13, HRAS, KIT, MAX, MEN1, MET, MITF, MLH1, MSH2, MSH3, MSH6, MUTYH, NBN, NF1, NF2, NTHL1, PALB2, PDGFRA, PHOX2B, PMS2, POLD1, POLE, POT1, PRKAR1A, PTCH1, PTEN, RAD50, RAD51C, RAD51D, RB1, RECQL4, RET, RUNX1, SDHA, SDHAF2, SDHB, SDHC, SDHD, SMAD4, SMARCA4, SMARCB1, SMARCE1, STK11, SUFU, TERC, TERT, TMEM127, TP53, TSC1, TSC2, VHL, WRN, WT1).  A Variant of Uncertain Significance was also detected called CEBPA c.1055A>G (p.Lys352Arg). At this time, it is unknown if this finding is associated with increased cancer risk, but majority of these variants get reclassified to be  inconsequential. We emphasized that medical management should not be based on this finding. With time, we suspect the lab will determine the significance of it, if any. If we do learn more about it, we will try to contact Renee [name] to discuss it further. It is important to stay in touch with [place] periodically and keep the address and phone number up to date.  PALB2 Cancer Risks: It is important to note that the studies on cancer risk associated with the PALB2 gene are generally limited. For this reason, the cancer risk estimates given are likely to change as new data is obtained about PALB2 pathogenic mutations. Pathogenic mutations in PALB2 have been shown to increase the risk of breast cancer to 33-58% by age 12 depending on the research study. PALB2 mutations have also been associated with an increased risk of both ovarian cancer and pancreatic cancer, but specific risk estimates are not fully defined yet. Men with a PALB2 mutation may have an increased risk of female breast cancer and prostate cancer, but data is yet preliminary.    Medical Management: The following are recommendations from the NCCN (Genetic/Familial High-Risk Assessment: Breast and Ovarian V3.2019) that are specific to women with a PALB2 mutation. Because the NCCN updates these guidelines periodically, clinicians are recommended to view the most recent guidelines at https://www.martin.info/. Renee [name] had breast cancer at age 51, but underwent a lumpectomy. The following is recommended for her.  Breast Cancer:  - Starting at age 31 or 5-10 years prior to the earliest diagnosis of breast cancer in the family (whichever is earlier): Annual mammogram with consideration of tomosynthesis and breast MRI with contrast. - Option  of risk-reducing bilateral mastectomies in consideration of the family history.  Ovarian Cancer: - While the NCCN does not provide specific guidance regarding ovarian cancer risk management for women with a PALB2 mutation, women  may wish to discuss the option of risk-reducing bilateral salping-oophorectomy based on their age and other factors.  Pancreatic Cancer: - NCCN does not provide specific guidance regarding pancreatic cancer risk management for women with a PALB2 mutation.  Family Members: It is important that all of Renee [name] relatives (both men and women) know of the presence of this PALB2 gene mutation. Genetic testing can sort out who in the family is at risk and who is not. Her sister and daughters have already been evaluated, but she has a brother as well as nieces and nephews and many cousins.  If a child inherits two PALB2 mutations, one from each parent, they would have a rare genetic condition called Fanconi Anemia. For this reason, if anyone in the family who has this PALB2 mutation is considering having children or has very young children, they are recommended to have their partner tested.   Support and Resources: If Renee [name] is interested in information and support, there are two groups, Facing Our Risk (www.facingourrisk.com) and Bright Pink (www.brightpink.org) which some people have found useful. They provide opportunities to speak with other individuals from high-risk families. To locate genetic counselors in other cities, visit the website of the [employer] (ArtistMovie.se) and Secretary/administrator for a Social worker by zip code.  Ms. Seat is encouraged to remain in contact with Genetics on an annual basis so we can update her personal and family histories, and let her know of advances in cancer genetics that may benefit the family. Renee [name] questions were answered to her satisfaction today, and she knows she is welcome to call anytime with additional questions.     Steele Berg, MS, [place] Certified Genetic Counselor phone: [phone]

## 2018-01-23 ENCOUNTER — Ambulatory Visit: Payer: Self-pay | Admitting: Genetic Counselor

## 2018-01-23 DIAGNOSIS — Z1509 Genetic susceptibility to other malignant neoplasm: Secondary | ICD-10-CM

## 2018-01-23 DIAGNOSIS — Z1379 Encounter for other screening for genetic and chromosomal anomalies: Secondary | ICD-10-CM

## 2018-01-23 DIAGNOSIS — Z1501 Genetic susceptibility to malignant neoplasm of breast: Secondary | ICD-10-CM

## 2018-01-23 DIAGNOSIS — Z1589 Genetic susceptibility to other disease: Secondary | ICD-10-CM

## 2018-03-08 ENCOUNTER — Other Ambulatory Visit: Payer: Self-pay | Admitting: Internal Medicine

## 2018-03-08 NOTE — Telephone Encounter (Signed)
Copied from [place] [id number]. Topic: Quick Communication - Rx Refill/Question >> Mar 08, 2018 12:34 PM Neva Seat wrote: amphetamine-dextroamphetamine (ADDERALL) 10 MG tablet  Needing a refill  [id number] [place], [place] - [address] [place] [address] [place] [address] Phone: [phone] Fax: [phone]

## 2018-03-08 NOTE — Telephone Encounter (Signed)
Adderall refill Last Refill:01/28/18 #120 Last OV: 11/28/17 PCP: Dr. Burnice Logan [facility]acy:[facility] [id number] [place]

## 2018-03-08 NOTE — Telephone Encounter (Signed)
Okay for refill? Please advise 

## 2018-03-09 ENCOUNTER — Other Ambulatory Visit: Payer: Self-pay | Admitting: Internal Medicine

## 2018-03-09 MED ORDER — AMPHETAMINE-DEXTROAMPHETAMINE 10 MG PO TABS: 20.0000 mg | ORAL_TABLET | Freq: Two times a day (BID) | ORAL | 0 refills | Status: DC

## 2018-03-09 MED ORDER — AMPHETAMINE-DEXTROAMPHETAMINE 10 MG PO TABS
20.0000 mg | ORAL_TABLET | Freq: Two times a day (BID) | ORAL | 0 refills | Status: DC
Start: 2018-03-09 — End: 2018-03-09

## 2018-03-29 ENCOUNTER — Encounter (HOSPITAL_COMMUNITY): Payer: Self-pay

## 2018-03-29 ENCOUNTER — Encounter (HOSPITAL_COMMUNITY): Payer: Self-pay | Admitting: Hematology

## 2018-03-29 ENCOUNTER — Inpatient Hospital Stay (HOSPITAL_COMMUNITY): Payer: PRIVATE HEALTH INSURANCE

## 2018-03-29 ENCOUNTER — Ambulatory Visit (HOSPITAL_COMMUNITY): Payer: PRIVATE HEALTH INSURANCE

## 2018-03-29 ENCOUNTER — Other Ambulatory Visit (HOSPITAL_COMMUNITY): Payer: PRIVATE HEALTH INSURANCE

## 2018-03-29 ENCOUNTER — Ambulatory Visit (HOSPITAL_COMMUNITY): Payer: PRIVATE HEALTH INSURANCE | Admitting: Internal Medicine

## 2018-03-29 ENCOUNTER — Inpatient Hospital Stay (HOSPITAL_COMMUNITY): Payer: PRIVATE HEALTH INSURANCE | Attending: Hematology

## 2018-03-29 ENCOUNTER — Inpatient Hospital Stay (HOSPITAL_BASED_OUTPATIENT_CLINIC_OR_DEPARTMENT_OTHER): Payer: PRIVATE HEALTH INSURANCE | Admitting: Hematology

## 2018-03-29 VITALS — BP 103/59 | HR 93 | Temp 98.9°F | Resp 18 | Wt 119.2 lb

## 2018-03-29 DIAGNOSIS — Z170 Estrogen receptor positive status [ER+]: Secondary | ICD-10-CM | POA: Diagnosis not present

## 2018-03-29 DIAGNOSIS — C50912 Malignant neoplasm of unspecified site of left female breast: Secondary | ICD-10-CM

## 2018-03-29 DIAGNOSIS — M8580 Other specified disorders of bone density and structure, unspecified site: Secondary | ICD-10-CM | POA: Insufficient documentation

## 2018-03-29 DIAGNOSIS — Z79811 Long term (current) use of aromatase inhibitors: Secondary | ICD-10-CM

## 2018-03-29 DIAGNOSIS — Z87891 Personal history of nicotine dependence: Secondary | ICD-10-CM | POA: Insufficient documentation

## 2018-03-29 DIAGNOSIS — C773 Secondary and unspecified malignant neoplasm of axilla and upper limb lymph nodes: Secondary | ICD-10-CM | POA: Insufficient documentation

## 2018-03-29 DIAGNOSIS — M85852 Other specified disorders of bone density and structure, left thigh: Secondary | ICD-10-CM

## 2018-03-29 DIAGNOSIS — F329 Major depressive disorder, single episode, unspecified: Secondary | ICD-10-CM | POA: Insufficient documentation

## 2018-03-29 DIAGNOSIS — E039 Hypothyroidism, unspecified: Secondary | ICD-10-CM | POA: Diagnosis not present

## 2018-03-29 LAB — CBC WITH DIFFERENTIAL/PLATELET
BASOS ABS: 0 10*3/uL (ref 0.0–0.1)
BASOS PCT: 0 %
EOS ABS: 0.1 10*3/uL (ref 0.0–0.7)
Eosinophils Relative: 1 %
HEMATOCRIT: 39.4 % (ref 36.0–46.0)
HEMOGLOBIN: 13.4 g/dL (ref 12.0–15.0)
Lymphocytes Relative: 17 %
Lymphs Abs: 1.6 10*3/uL (ref 0.7–4.0)
MCH: 35.9 pg — ABNORMAL HIGH (ref 26.0–34.0)
MCHC: 34 g/dL (ref 30.0–36.0)
MCV: 105.6 fL — ABNORMAL HIGH (ref 78.0–100.0)
Monocytes Absolute: 0.6 10*3/uL (ref 0.1–1.0)
Monocytes Relative: 6 %
NEUTROS ABS: 6.8 10*3/uL (ref 1.7–7.7)
NEUTROS PCT: 76 %
Platelets: 208 10*3/uL (ref 150–400)
RBC: 3.73 MIL/uL — ABNORMAL LOW (ref 3.87–5.11)
RDW: 13 % (ref 11.5–15.5)
WBC: 9 10*3/uL (ref 4.0–10.5)

## 2018-03-29 LAB — COMPREHENSIVE METABOLIC PANEL
ALBUMIN: 4 g/dL (ref 3.5–5.0)
ALK PHOS: 66 U/L (ref 38–126)
ALT: 16 U/L (ref 0–44)
ANION GAP: 10 (ref 5–15)
AST: 21 U/L (ref 15–41)
BILIRUBIN TOTAL: 0.6 mg/dL (ref 0.3–1.2)
BUN: 16 mg/dL (ref 6–20)
CALCIUM: 9.7 mg/dL (ref 8.9–10.3)
CO2: 27 mmol/L (ref 22–32)
Chloride: 101 mmol/L (ref 98–111)
Creatinine, Ser: 0.83 mg/dL (ref 0.44–1.00)
GFR calc Af Amer: >60 mL/min (ref >60)
GFR calc non Af Amer: >60 mL/min (ref >60)
GLUCOSE: 137 mg/dL — AB (ref 70–99)
POTASSIUM: 3.9 mmol/L (ref 3.5–5.1)
SODIUM: 138 mmol/L (ref 135–145)
TOTAL PROTEIN: 6.8 g/dL (ref 6.5–8.1)

## 2018-03-29 MED ORDER — DENOSUMAB 60 MG/ML ~~LOC~~ SOSY
60.0000 mg | PREFILLED_SYRINGE | Freq: Once | SUBCUTANEOUS | Status: AC
  Administered 2018-03-29: 60 mg via SUBCUTANEOUS
  Filled 2018-03-29: qty 1

## 2018-03-29 NOTE — Assessment & Plan Note (Signed)
1. Stage III (T3N1G3) left breast cancer: -Left mastectomy and latissimus dorsi flap reconstruction on 10/08/2009, 2 out of 3 lymph nodes positive, Ki-67 20%, ER/PR positive and HER-2 negative - Status post epirubicin and Cytoxan x4 cycles followed by weekly Taxol x12, status post XRT finished on 06/19/2010, tamoxifen started on 06/13/2010 -Switched to Arimidex on 04/03/2014, continues to tolerate it well.  She will continue Arimidex until November 2021. -Last MRI of the right breast in November 2018 was within normal limits.  We will schedule her for right breast mammogram in November.  2.  Osteopenia: -DEXA scan in March 2018 showed T score of -1.6 consistent with osteopenia.  She is receiving Prolia injections every 6 months.  She will proceed with one today.  She will continue calcium and vitamin D supplements.  3.  Genetic testing: -Sister was diagnosed with metastatic breast cancer and was found to have PALB2 mutation.  Patient was also tested positive for the same mutation.  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  She is at high risk for breast cancer, ovarian cancer, pancreatic cancer and prostate cancer in males. -Patient's youngest daughter was also positive for the same mutation. -Patient complaining of abdominal bloating and right lower quadrant cramping for the last few months.  Given her increased risk for ovarian cancer, I have recommended doing a CT scan of the abdomen and pelvis.  Patient will call us for the results.

## 2018-03-29 NOTE — Progress Notes (Signed)
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   BRIEF ONCOLOGIC HISTORY:    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    Initial Diagnosis    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    10/22/2016 Imaging    Bone density-BMD as determined from Femur Neck Right is 0.822 g/cm2 with a T-Score of -1.6.  This patient is considered OSTEOPENIC according to Renee [name]) criteria.      [name] STAGING: [name] Staging 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 Staging form: Breast, AJCC 7th Edition - Clinical: IIIA - Signed by Renee Cancer, PA on 06/15/2011    INTERVAL HISTORY:  Renee [name] 47 y.o. female returns for routine follow-up infiltrating ductal carcinoma of the left breast. Patient is here today and doing well with her oral therapy. Patient is taking Arimidex daily with out any side effects. She does have fatigue throughout the day. She is constipated at times. She has complained of abdominal pain and bloating for about a month now. Patient reports her appetite is 100%. Her energy level is 75%. Patient denies any nausea, vomiting, or diarrhea. Denies any rash. Denies any hot flashes or headaches.     REVIEW OF SYSTEMS:  Review of Systems  Constitutional: Positive for fatigue.  Gastrointestinal: Positive for constipation.  All other systems reviewed and are negative.    PAST MEDICAL/SURGICAL HISTORY:  Past Medical History:  Diagnosis Date  . Acute medial meniscus tear of left knee 04/20/2016  . Breast [name] ([place])    2010 ; left side mastectomy chemo pill  . Depression   . DVT (deep venous  thrombosis) ([facility])    left arm  . Family history of breast [name] in sister    sister with PALB2 mutation  . Genetic testing 01/23/18   Multi-[name] panel (83 genes) @ [facility] - Pathogenic mutation in the PALB2 gene  . Hypothyroidism   . 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 Osteopenia 09/01/2014  . PTSD (post-traumatic stress disorder) 08/15/2014  . Rupture of posterior cruciate ligament of left knee 04/20/2016  . 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 Thyroid disease    Past Surgical History:  Procedure Laterality Date  . BREAST CAPSULECTOMY WITH IMPLANT EXCHANGE Left 08/19/2011  . BREAST RECONSTRUCTION Left 01/15/2011  . MINOR IRRIGATION AND DEBRIDEMENT OF WOUND Left 03/05/2011   back and left breast  . MODIFIED RADICAL MASTECTOMY Left 10/08/2009  . PLACEMENT OF BREAST IMPLANTS Right 08/19/2011  . PORT-A-CATH REMOVAL  07/15/2010  . PORTACATH PLACEMENT  11/14/2009   with left breast wound revision     SOCIAL HISTORY:  Social History   Socioeconomic History  . Marital status: Divorced    Spouse name: Not on file  . Number of children: Not on file  . Years of education: Not on file  . Highest education level: Not on file  Occupational History  . Not on file  Social Needs  . Financial resource strain: Not on file  . Food insecurity:    Worry: Not on file    Inability: Not on file  . Transportation needs:    Medical: Not  on file    Non-medical: Not on file  Tobacco Use  . Smoking status: Former Smoker    Packs/day: 0.50    Years: 14.00    Pack years: 7.00    Last attempt to quit: 09/22/2010    Years since quitting: 7.5  . Smokeless tobacco: Never Used  Substance and Sexual Activity  . Alcohol use: No  . Drug use: No  . Sexual activity: Not on file  Lifestyle  . Physical activity:    Days per week: Not on file    Minutes per session: Not on file  . Stress: Not on file  Relationships  . Social connections:     Talks on phone: Not on file    Gets together: Not on file    Attends religious service: Not on file    Active member of club or organization: Not on file    Attends meetings of clubs or organizations: Not on file    Relationship status: Not on file  . Intimate partner violence:    Fear of current or ex partner: Not on file    Emotionally abused: Not on file    Physically abused: Not on file    Forced sexual activity: Not on file  Other Topics Concern  . Not on file  Social History Narrative  . Not on file    FAMILY HISTORY:  Family History  Problem Relation Age of Onset  . Hypertension Mother   . Diabetes Father   . Stroke Father   . Hypertension Father   . Hypertension Maternal Grandmother   . Diabetes Paternal Grandmother   . Lung [name] Paternal Grandfather        deceased 71s; smoker  . Breast [name] Sister 58       currently 79; PALB2 mutation    CURRENT MEDICATIONS:  Outpatient Encounter Medications as of 03/29/2018  Medication Sig  . amphetamine-dextroamphetamine (ADDERALL) 10 MG tablet Take 2 tablets (20 mg total) by mouth 2 (two) times daily.  Marland Kitchen anastrozole (ARIMIDEX) 1 MG tablet Take 1 tablet (1 mg total) by mouth daily.  Marland Kitchen buPROPion (WELLBUTRIN XL) 150 MG 24 hr tablet TAKE 1 TABLET (150 MG TOTAL) BY MOUTH DAILY.  . calcium-vitamin D (OSCAL WITH D) 500-200 MG-UNIT per tablet Take 1 tablet by mouth 2 (two) times daily.  Marland Kitchen levothyroxine (SYNTHROID, LEVOTHROID) 100 MCG tablet Take 1 tablet (100 mcg total) by mouth daily.  Marland Kitchen LORazepam (ATIVAN) 0.5 MG tablet TAKE TWO TABLETS BY MOUTH EVERY 8 HOURS AS NEEDED FOR ANXIETY   No facility-administered encounter medications on file as of 03/29/2018.     ALLERGIES:  No Known Allergies   PHYSICAL EXAM:  ECOG Performance status: 0  VITAL SIGNS: BP 103/59, P: 93, R:18, TEMP:98.9, Sats: 99%. Weight: 119.2  Physical Exam  Constitutional: She is oriented to person, place, and time. She appears well-developed and  well-nourished.  Cardiovascular: Normal rate, regular rhythm and normal heart sounds.  Pulmonary/Chest: Effort normal and breath sounds normal.  Neurological: She is alert and oriented to person, place, and time.  Skin: Skin is warm and dry.     LABORATORY DATA:  I have reviewed the labs as listed.  CBC    Component Value Date/Time   WBC 9.0 03/29/2018 1435   RBC 3.73 (L) 03/29/2018 1435   HGB 13.4 03/29/2018 1435   HCT 39.4 03/29/2018 1435   PLT 208 03/29/2018 1435   MCV 105.6 (H) 03/29/2018 1435   MCH 35.9 (H) 03/29/2018 1435  MCHC 34.0 03/29/2018 1435   RDW 13.0 03/29/2018 1435   LYMPHSABS 1.6 03/29/2018 1435   MONOABS 0.6 03/29/2018 1435   EOSABS 0.1 03/29/2018 1435   BASOSABS 0.0 03/29/2018 1435   CMP Latest Ref Rng & Units 03/29/2018 01/09/2018 09/28/2017  Glucose 70 - 99 mg/dL 137(H) 80 93  BUN 6 - 20 mg/dL _0 Creatinine 0.44 - 1.00 mg/dL 0.83 0.75 0.98  Sodium 135 - 145 mmol/L 138 139 135  Potassium 3.5 - 5.1 mmol/L 3.9 3.7 3.7  Chloride 98 - 111 mmol/L 101 103 98(L)  CO2 22 - 32 mmol/L _1 Calcium 8.9 - 10.3 mg/dL 9.7 9.4 9.4  Total Protein 6.5 - 8.1 g/dL 6.8 7.0 7.1  Total Bilirubin 0.3 - 1.2 mg/dL 0.6 0.2 0.4  Alkaline Phos 38 - 126 U/L 66 71 72  AST 15 - 41 U/L 21 34 22  ALT 0 - 44 U/L _2 DIAGNOSTIC IMAGING:  I have reviewed the MRI of the breast from November 2018.     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 Stage III (T3N1G3) left breast [name]: -Left mastectomy and latissimus dorsi flap reconstruction on 10/08/2009, 2 out of 3 lymph nodes positive, Ki-67 20%, ER/PR positive and HER-2 negative - Status post epirubicin and Cytoxan x4 cycles followed by weekly Taxol x12, status post XRT finished on 06/19/2010, tamoxifen started on 06/13/2010 -Switched to Arimidex on 04/03/2014, continues to tolerate it well.  She will continue Arimidex until November 2021. -Last MRI of the right breast in November  2018 was within normal limits.  We will schedule her for right breast mammogram in November.  2.  Osteopenia: -DEXA scan in March 2018 showed T score of -1.6 consistent with osteopenia.  She is receiving Prolia injections every 6 months.  She will proceed with one today.  She will continue calcium and vitamin D supplements.  3.  Genetic testing: -Sister was diagnosed with metastatic breast [name] and was found to have PALB2 mutation.  Patient was also tested positive for the same mutation.  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  She is at high risk for breast [name], ovarian [name], pancreatic [name] and prostate [name] in males. -Patient's youngest daughter was also positive for the same mutation. -Patient complaining of abdominal bloating and right lower quadrant cramping for the last few months.  Given her increased risk for ovarian [name], I have recommended doing a CT scan of the abdomen and pelvis.  Patient will call us for the results.      Orders placed this encounter:  Orders Placed This Encounter  Procedures  . MM Digital Screening  . CT Abdomen Pelvis W Contrast  . CBC with Differential/Platelet  . Comprehensive metabolic panel      Renee Jack, MD [place] [phone]

## 2018-03-29 NOTE — Patient Instructions (Signed)
[facility] at [facility] Discharge Instructions  Follow up in 6 months with labs    Thank you for choosing [facility] at [facility] to provide your oncology and hematology care.  To afford each patient quality time with our provider, please arrive at least 15 minutes before your scheduled appointment time.   If you have a lab appointment with the Cancer Center please come in thru the  Main Entrance and check in at the main information desk  You need to re-schedule your appointment should you arrive 10 or more minutes late.  We strive to give you quality time with our providers, and arriving late affects you and other patients whose appointments are after yours.  Also, if you no show three or more times for appointments you may be dismissed from the clinic at the providers discretion.     Again, thank you for choosing [facility].  Our hope is that these requests will decrease the amount of time that you wait before being seen by our physicians.       _____________________________________________________________  Should you have questions after your visit to  Cancer Center, please contact our office at [phone] between the hours of 8:00 a.m. and 4:30 p.m.  Voicemails left after 4:00 p.m. will not be returned until the following business day.  For prescription refill requests, have your pharmacy contact our office and allow 72 hours.    Cancer Center Support Programs:   > Cancer Support Group  2nd Tuesday of the month 1pm-2pm, Journey Room    

## 2018-03-29 NOTE — Patient Instructions (Signed)
[facility] at [facility] Discharge Instructions  Received Prolia injection today. Follow-up as scheduled. Call clinic for any questions or concerns   Thank you for choosing [facility] at [facility] to provide your oncology and hematology care.  To afford each patient quality time with our provider, please arrive at least 15 minutes before your scheduled appointment time.   If you have a lab appointment with the Cancer Center please come in thru the  Main Entrance and check in at the main information desk  You need to re-schedule your appointment should you arrive 10 or more minutes late.  We strive to give you quality time with our providers, and arriving late affects you and other patients whose appointments are after yours.  Also, if you no show three or more times for appointments you may be dismissed from the clinic at the providers discretion.     Again, thank you for choosing [facility].  Our hope is that these requests will decrease the amount of time that you wait before being seen by our physicians.       _____________________________________________________________  Should you have questions after your visit to [facility], please contact our office at [phone] between the hours of 8:00 a.m. and 4:30 p.m.  Voicemails left after 4:00 p.m. will not be returned until the following business day.  For prescription refill requests, have your pharmacy contact our office and allow 72 hours.    Cancer Center Support Programs:   > Cancer Support Group  2nd Tuesday of the month 1pm-2pm, Journey Room   

## 2018-03-29 NOTE — Progress Notes (Signed)
Renee [name] tolerated Prolia injection well without complaints or incident. Calcium 9.7 today and pt denied any tooth or jaw pain and no recent or future dental visits. Pt discharged in satisfactory condition

## 2018-04-11 ENCOUNTER — Telehealth: Payer: Self-pay | Admitting: Internal Medicine

## 2018-04-11 NOTE — Telephone Encounter (Signed)
Copied from [place] [id number]. Topic: Quick Communication - Rx Refill/Question >> Apr 11, 2018 10:05 AM Scherrie Gerlach wrote: Medication: amphetamine-dextroamphetamine (ADDERALL) 10 MG tablet Last OV 4/22  no future appts scheduled [id number] [place], [place] - [address] [phone] (Phone) [phone] (Fax)

## 2018-04-12 ENCOUNTER — Other Ambulatory Visit: Payer: Self-pay | Admitting: Internal Medicine

## 2018-04-12 MED ORDER — AMPHETAMINE-DEXTROAMPHETAMINE 10 MG PO TABS: 20.0000 mg | ORAL_TABLET | Freq: Two times a day (BID) | ORAL | 0 refills | Status: DC

## 2018-04-12 NOTE — Telephone Encounter (Signed)
Noted  

## 2018-04-12 NOTE — Telephone Encounter (Signed)
Okay for refill? Please advise 

## 2018-04-12 NOTE — Telephone Encounter (Signed)
3 months of medications refilled

## 2018-04-14 ENCOUNTER — Ambulatory Visit (HOSPITAL_COMMUNITY)
Admission: RE | Admit: 2018-04-14 | Discharge: 2018-04-14 | Disposition: A | Discharge status: Home / Self Care | Payer: PRIVATE HEALTH INSURANCE | Source: Ambulatory Visit | Attending: Nurse Practitioner | Admitting: Nurse Practitioner

## 2018-04-14 DIAGNOSIS — N200 Calculus of kidney: Secondary | ICD-10-CM | POA: Diagnosis not present

## 2018-04-14 DIAGNOSIS — K7689 Other specified diseases of liver: Secondary | ICD-10-CM | POA: Diagnosis not present

## 2018-04-14 DIAGNOSIS — C50912 Malignant neoplasm of unspecified site of left female breast: Secondary | ICD-10-CM | POA: Diagnosis present

## 2018-04-14 DIAGNOSIS — I700 Atherosclerosis of aorta: Secondary | ICD-10-CM | POA: Diagnosis not present

## 2018-04-14 DIAGNOSIS — M47817 Spondylosis without myelopathy or radiculopathy, lumbosacral region: Secondary | ICD-10-CM | POA: Insufficient documentation

## 2018-04-14 MED ORDER — IOPAMIDOL (ISOVUE-300) INJECTION 61%
100.0000 mL | Freq: Once | INTRAVENOUS | Status: AC | PRN
  Administered 2018-04-14: 100 mL via INTRAVENOUS

## 2018-04-20 ENCOUNTER — Telehealth (HOSPITAL_COMMUNITY): Payer: Self-pay | Admitting: *Deleted

## 2018-05-03 ENCOUNTER — Other Ambulatory Visit (HOSPITAL_COMMUNITY): Payer: Self-pay | Admitting: Nurse Practitioner

## 2018-05-03 ENCOUNTER — Encounter (HOSPITAL_COMMUNITY): Payer: Self-pay | Admitting: *Deleted

## 2018-05-03 NOTE — Progress Notes (Signed)
Patient called clinic wanting results from her CT scan.    I spoke with Francene Finders, NP and Dr. Delton Coombes and was asked to see if patient was still having symptoms.  I spoke with patient and she states that she still has pain that "starts in the top of her stomach and hurts all the way around to her lower stomach" She states that her bowels are not normal. She has diarrhea at times and then "can go days with out a good bowel movement".   She states that she eats a mostly vegetable and fruit diet with little meat.  She did eat some meat a few nights ago and it made her nauseas and she vomited again.    I spoke with the providers again and they state to put in a referral for GI for them to evaluate patient.    Patient is aware of results of scan and her referral. She is aware that their office will call with appointment.

## 2018-05-09 ENCOUNTER — Encounter: Payer: Self-pay | Admitting: Gastroenterology

## 2018-05-11 ENCOUNTER — Other Ambulatory Visit: Payer: Self-pay | Admitting: Internal Medicine

## 2018-05-11 NOTE — Telephone Encounter (Signed)
Copied from [place] [phone]. Topic: Quick Communication - Rx Refill/Question >> May 11, 2018 11:11 AM Yvette Rack wrote: Medication: amphetamine-dextroamphetamine (ADDERALL) 10 MG tablet  Has the patient contacted their pharmacy? Yes.   (Agent: If no, request that the patient contact the pharmacy for the refill.) (Agent: If yes, when and what did the pharmacy advise?) to call provider  Preferred Pharmacy (with phone number or street name): [id number] [place], [place] - [address] [phone] (Phone) [phone] (Fax)    Agent: Please be advised that RX refills may take up to 3 business days. We ask that you follow-up with your pharmacy.

## 2018-05-12 NOTE — Telephone Encounter (Signed)
Okay for refill? Please advise 

## 2018-05-15 MED ORDER — AMPHETAMINE-DEXTROAMPHETAMINE 10 MG PO TABS: 20.0000 mg | ORAL_TABLET | Freq: Two times a day (BID) | ORAL | 0 refills | Status: DC

## 2018-05-15 NOTE — Telephone Encounter (Signed)
[facility] is calling the image for adderall did not come over clearly please resent

## 2018-06-07 ENCOUNTER — Other Ambulatory Visit: Payer: Self-pay | Admitting: Internal Medicine

## 2018-06-08 NOTE — Telephone Encounter (Signed)
Left message to return phone call.

## 2018-06-19 ENCOUNTER — Encounter: Payer: Self-pay | Admitting: Family Medicine

## 2018-06-19 ENCOUNTER — Ambulatory Visit (INDEPENDENT_AMBULATORY_CARE_PROVIDER_SITE_OTHER): Payer: PRIVATE HEALTH INSURANCE | Admitting: Family Medicine

## 2018-06-19 ENCOUNTER — Encounter (HOSPITAL_COMMUNITY): Payer: Self-pay

## 2018-06-19 ENCOUNTER — Other Ambulatory Visit (HOSPITAL_COMMUNITY): Payer: Self-pay | Admitting: Nurse Practitioner

## 2018-06-19 ENCOUNTER — Ambulatory Visit (HOSPITAL_COMMUNITY)
Admission: RE | Admit: 2018-06-19 | Discharge: 2018-06-19 | Disposition: A | Discharge status: Home / Self Care | Payer: Self-pay | Source: Ambulatory Visit | Attending: Nurse Practitioner | Admitting: Nurse Practitioner

## 2018-06-19 VITALS — BP 116/80 | HR 79 | Temp 98.4°F | Wt 116.4 lb

## 2018-06-19 DIAGNOSIS — F331 Major depressive disorder, recurrent, moderate: Secondary | ICD-10-CM

## 2018-06-19 DIAGNOSIS — C50912 Malignant neoplasm of unspecified site of left female breast: Secondary | ICD-10-CM | POA: Insufficient documentation

## 2018-06-19 DIAGNOSIS — F900 Attention-deficit hyperactivity disorder, predominantly inattentive type: Secondary | ICD-10-CM

## 2018-06-19 MED ORDER — AMPHETAMINE-DEXTROAMPHETAMINE 20 MG PO TABS: 20.0000 mg | ORAL_TABLET | Freq: Two times a day (BID) | ORAL | 0 refills | Status: DC

## 2018-06-19 NOTE — Patient Instructions (Signed)
Call the hospice grief counseling program  Since you wish to see somebody at [facility] call that office and be sure to find out if your new provider treats adult ADD

## 2018-06-19 NOTE — Progress Notes (Signed)
This is a 47 year old female who comes in today for evaluation of depression and a med refill  Her sister died recently of metastatic breast cancer.  She herself is a 6-year survivor of breast cancer.  I advised her to go see the folks at hospice  She has a history of adult ADD and takes Adderall 40 mg twice daily.  She needs a refill  BP 116/80 (BP Location: Right Arm, Patient Position: Sitting, Cuff Size: Normal)   Pulse 79   Temp 98.4 F (36.9 C) (Oral)   Wt 116 lb 6.4 oz (52.8 kg)   SpO2 97%   BMI 22.73 kg/m  Well-developed well-nourished female no acute distress vital signs stable she is afebrile  1.  Depression secondary to death of her sister........ consult with hospice  2.  History of adult ADD ...........Marland Kitchen refill medication....... she wants to get a new physician at [facility] since it is close to her house

## 2018-06-22 ENCOUNTER — Telehealth: Payer: Self-pay | Admitting: Internal Medicine

## 2018-06-22 ENCOUNTER — Other Ambulatory Visit: Payer: Self-pay

## 2018-06-22 NOTE — Telephone Encounter (Signed)
Copied from [place] [phone]. Topic: Quick Communication - See Telephone Encounter >> Jun 22, 2018  2:29 PM Blase Mess A wrote: CRM for notification. See Telephone encounter for: 06/22/18.  Patient is calling to request a prior authorization for amphetamine-dextroamphetamine (ADDERALL) 20 MG tablet [[id number]]  Please advise [phone]

## 2018-06-26 NOTE — Telephone Encounter (Signed)
Copied from [place] [phone]. Topic: Quick Communication - See Telephone Encounter >> Jun 26, 2018  2:14 PM Blase Mess A wrote: CRM for notification. See Telephone encounter for: 06/26/18.  Patient is calling to request a prior authorization for adderall.  Please advise [phone]

## 2018-06-27 NOTE — Telephone Encounter (Signed)
Wrong office

## 2018-06-27 NOTE — Telephone Encounter (Signed)
Patient called regarding Prior authorization for amphetamine-dextroamphetamine (ADDERALL) 20 MG tablet. Stated that she have been without this medication for more than a week now. Ph# [phone]

## 2018-06-28 NOTE — Telephone Encounter (Signed)
Patient calling to check the status of this PA for her adderall. Please advise. Patient would like a phone call today, if possible.

## 2018-06-28 NOTE — Telephone Encounter (Signed)
Patient notified of results and verbalized understanding.  

## 2018-06-30 ENCOUNTER — Ambulatory Visit: Payer: Self-pay | Admitting: *Deleted

## 2018-06-30 NOTE — Telephone Encounter (Signed)
  Patient is calling to report that she is having withdrawal symptoms from the Adderall.  Reason for Disposition . Caller has NON-URGENT medication question about med that PCP prescribed and triager unable to answer question  Answer Assessment - Initial Assessment Questions 1. SYMPTOMS: "Do you have any symptoms?"     Patient was out of her medications due to provider retirement- and Dr Sherren Mocha changed her Rx to make the dosing easier for her- He has now retire and she is still with out medication. 2. SEVERITY: If symptoms are present, ask "Are they mild, moderate or severe?"     Patient is having withdrawal symptoms- patient has had headache for 1 week. Patient has had diarrhea 1 1/2 weeks and vomiting for the last 3 days. Patient is having trouble concentrating at work and she has had increased stress with the loss of her sister. She would like to get some help with the medication.    Patient has not been taking anything for the diarrhea- she states she does drink water. All stools are loose- advised at least one dose of Imodium. Patient feels that she is going to vomit if she eats. Patient is eating- light and it is staying down for the most part. Patient is able to keep fluids down. Headache is not present in the morning- it appears as the day progresses. Patient can take Tylenol or Ibuprofen to make it go away- but it is daily. Patient is aware the office is working on the Rx for her Adderall and she is expecting to hear something on Monday.  Protocols used: MEDICATION QUESTION CALL-A-AH

## 2018-06-30 NOTE — Telephone Encounter (Signed)
Pt states she did not get a call from anyone.  Pt states Dr Sherren Mocha changed the Rx when she saw him on 11/11 to 2/ 20 mg instead of 4/10 mg and that is what prompted the PA. Pt states she needs her med

## 2018-06-30 NOTE — Telephone Encounter (Signed)
Dr Fry please advise. thanks 

## 2018-07-01 NOTE — Telephone Encounter (Signed)
Dr. Sherren Mocha sent in a 3 month supply on 06-19-18

## 2018-07-03 NOTE — Telephone Encounter (Signed)
[phone]  Called and spoke with pt and she stated that the pharmacy did fill her meds and she should be able to pick this up today.

## 2018-07-03 NOTE — Telephone Encounter (Signed)
I informed pt days ago that we do not control nor know the time frame of how long prior auth may take. Pt verbalized understanding.

## 2018-07-03 NOTE — Telephone Encounter (Signed)
Spoke with Aniceto Boss at [employer]. They were requiring PA since dose had changed. She has approved a one-time change in PA to cover this dose change. If another change occurs a new PA will have to be completed. She is contacting pharmacy to advise ok to fill rx.

## 2018-07-04 NOTE — Telephone Encounter (Signed)
Noted  

## 2018-07-07 ENCOUNTER — Telehealth: Payer: Self-pay | Admitting: Internal Medicine

## 2018-07-07 NOTE — Telephone Encounter (Signed)
Copied from [place] [id number] [id number]. Topic: Quick Communication - See Telephone Encounter >> Jul 07, 2018 10:48 AM Antonieta Iba C wrote: CRM for notification. See Telephone encounter for: 07/07/18.  [employer] called in for PA for amphetamine-dextroamphetamine (ADDERALL) 20 MG tablet, they received the new Rx with the dosage increase to 20 MG and need to have ov notes on why medication increase for approval.   Fax: [phone]   Phone: [phone]

## 2018-07-10 NOTE — Telephone Encounter (Signed)
Office notes faxed 

## 2018-07-17 ENCOUNTER — Ambulatory Visit (INDEPENDENT_AMBULATORY_CARE_PROVIDER_SITE_OTHER): Payer: No Typology Code available for payment source | Admitting: Family Medicine

## 2018-07-17 ENCOUNTER — Encounter: Payer: Self-pay | Admitting: Family Medicine

## 2018-07-17 VITALS — BP 134/90 | HR 90 | Temp 97.3°F | Ht 60.5 in | Wt 120.5 lb

## 2018-07-17 DIAGNOSIS — F909 Attention-deficit hyperactivity disorder, unspecified type: Secondary | ICD-10-CM | POA: Diagnosis not present

## 2018-07-17 DIAGNOSIS — F322 Major depressive disorder, single episode, severe without psychotic features: Secondary | ICD-10-CM

## 2018-07-17 DIAGNOSIS — E890 Postprocedural hypothyroidism: Secondary | ICD-10-CM | POA: Diagnosis not present

## 2018-07-17 DIAGNOSIS — I700 Atherosclerosis of aorta: Secondary | ICD-10-CM | POA: Diagnosis not present

## 2018-07-17 MED ORDER — LEVOTHYROXINE SODIUM 100 MCG PO TABS: 100.0000 ug | ORAL_TABLET | Freq: Every day | ORAL | 11 refills | Status: DC

## 2018-07-17 NOTE — Assessment & Plan Note (Signed)
Will get TSH with next lab draw. Stable on current dose

## 2018-07-17 NOTE — Assessment & Plan Note (Addendum)
ASCVD 1.1%. Noted on recent CT scan. Discussed option for cardiology referral in the future when she has less stress. Will discuss statin and ASA therapy as this may help decrease growth. BP also a little elevated today - will reassess at next visit. Already quit smoking and eats healthy diet.

## 2018-07-17 NOTE — Progress Notes (Signed)
Subjective:     Renee [name] is a 47 y.o. female presenting for Transfer of Care (previously saw Dr Raliegh Ip and Dr Sherren Mocha at [facility] in [place].)     HPI   #hypothyroidism - due to ablation 2/2 to [facility] - on levothyroxine  #Adult ADHD - taking Adderall x 5 years - was on it as a child - stopped for a while - has helped reduce the fog - she is able to focus well - no longer gets distracted - Takes it 2 times per day - no insomnia -   Also on Wellbutrin Going to Hospice - currently grieving the death of her sister   The 10-year ASCVD risk score Mikey Bussing DC Brooke Bonito., et al., 2013) is: 1.1%   Values used to calculate the score:     Age: 31 years     Sex: Female     Is Non-Hispanic African American: No     Diabetic: No     Tobacco smoker: No     Systolic Blood Pressure: 629 mmHg     Is BP treated: No     HDL Cholesterol: 70.7 mg/dL     Total Cholesterol: 254 mg/dL   Review of Systems No fever/chills  Social History   Tobacco Use  Smoking Status Former Smoker  . Packs/day: 0.50  . Years: 14.00  . Pack years: 7.00  . Last attempt to quit: 09/22/2010  . Years since quitting: 7.8  Smokeless Tobacco Never Used        Objective:    BP Readings from Last 3 Encounters:  07/17/18 134/90  06/19/18 116/80  03/29/18 (!) 103/59   Wt Readings from Last 3 Encounters:  07/17/18 120 lb 8 oz (54.7 kg)  06/19/18 116 lb 6.4 oz (52.8 kg)  03/29/18 119 lb 3.2 oz (54.1 kg)    BP 134/90   Pulse 90   Temp (!) 97.3 F (36.3 C)   Ht 5' 0.5" (1.537 m)   Wt 120 lb 8 oz (54.7 kg)   SpO2 98%   BMI 23.15 kg/m    Physical Exam  Constitutional: She appears well-developed and well-nourished. No distress.  HENT:  Right Ear: External ear normal.  Left Ear: External ear normal.  Nose: Nose normal.  Eyes: Conjunctivae and EOM are normal.  Neck: Neck supple.  Cardiovascular: Normal rate.  Pulmonary/Chest: Effort normal.  Neurological: She is alert.  Skin: Skin is warm and  dry. Capillary refill takes less than 2 seconds. She is not diaphoretic.  Psychiatric: She has a normal mood and affect.          Assessment & Plan:   Problem List Items Addressed This Visit      Cardiovascular and Mediastinum   Atherosclerosis of aorta (HCC)    ASCVD 1.1%. Noted on recent CT scan. Discussed option for cardiology referral in the future when she has less stress. Will discuss statin and ASA therapy as this may help decrease growth. BP also a little elevated today - will reassess at next visit. Already quit smoking and eats healthy diet.         Endocrine   Hypothyroidism    Will get TSH with next lab draw. Stable on current dose      Relevant Medications   levothyroxine (SYNTHROID, LEVOTHROID) 100 MCG tablet     Other   Severe major depression (HCC)    Grieving the loss of her sister. Currently getting therapy through hospice. Not interested in medication changes today.  Discussed following up if not improving. But suspect increase PHQ-9 is grief reaction driven       ADD (attention deficit disorder) - Primary    Stable on current medication. My consider Long acting in the future, but currently has more life stress. Controlled substance signed today. Will return for UDS      Relevant Orders   Drug Screen, Urine       Return in about 4 weeks (around 08/14/2018) for When due for ADHD refill.  Lesleigh Noe, MD

## 2018-07-17 NOTE — Assessment & Plan Note (Signed)
Stable on current medication. My consider Long acting in the future, but currently has more life stress. Controlled substance signed today. Will return for UDS

## 2018-07-17 NOTE — Patient Instructions (Signed)
Return for urine test  Talk to your Oncologist about checking your thyroid test with your next lab draw.

## 2018-07-17 NOTE — Assessment & Plan Note (Signed)
Grieving the loss of her sister. Currently getting therapy through hospice. Not interested in medication changes today. Discussed following up if not improving. But suspect increase PHQ-9 is grief reaction driven

## 2018-07-18 ENCOUNTER — Telehealth: Payer: Self-pay | Admitting: Family Medicine

## 2018-07-18 NOTE — Telephone Encounter (Signed)
Re faxed it to [employer]

## 2018-07-18 NOTE — Telephone Encounter (Signed)
°  Helene Kelp with [facility] ob/gyn called to let you know Renee [name] dob [birth year] medical records release needs to be sent to  [facility]   She has not been seen @ [place] yet

## 2018-07-20 ENCOUNTER — Ambulatory Visit: Payer: Self-pay | Admitting: Gastroenterology

## 2018-07-20 ENCOUNTER — Encounter: Payer: Self-pay | Admitting: Gastroenterology

## 2018-07-20 ENCOUNTER — Encounter: Payer: Self-pay | Admitting: Obstetrics

## 2018-07-20 ENCOUNTER — Telehealth: Payer: Self-pay | Admitting: Gastroenterology

## 2018-07-20 NOTE — Telephone Encounter (Signed)
PATIENT WAS A NO SHOW AND LETTER SENT  °

## 2018-07-20 NOTE — Telephone Encounter (Signed)
REVIEWED-NO ADDITIONAL RECOMMENDATIONS. 

## 2018-07-26 ENCOUNTER — Other Ambulatory Visit: Payer: No Typology Code available for payment source

## 2018-07-28 ENCOUNTER — Ambulatory Visit: Payer: PRIVATE HEALTH INSURANCE | Admitting: Family Medicine

## 2018-08-07 ENCOUNTER — Other Ambulatory Visit: Payer: Self-pay

## 2018-08-07 MED ORDER — AMPHETAMINE-DEXTROAMPHETAMINE 20 MG PO TABS: 20.0000 mg | ORAL_TABLET | Freq: Two times a day (BID) | ORAL | 0 refills | Status: DC

## 2018-08-07 NOTE — Telephone Encounter (Signed)
Name of Medication: adderall 20 mg Name of Pharmacy:[facility] or Written Date and Quantity: # 74 on 06/19/18 x 2; Dr Sherren Mocha has retired and [facility] cannot use the 2 remaining rx from Dr Sherren Mocha. Last Office Visit and Type:07/17/18 TOC ADHD  Next Office Visit and Type: 08/17/18 for ADHD Last Controlled Substance Agreement Date: 07/19/18 Last UDS:07/17/18   Amber from [facility] will d/c 2 rx for adderall 20 mg from Dr Sherren Mocha since he is retired.Please advise.

## 2018-08-17 ENCOUNTER — Ambulatory Visit: Payer: No Typology Code available for payment source | Admitting: Family Medicine

## 2018-08-17 DIAGNOSIS — Z0289 Encounter for other administrative examinations: Secondary | ICD-10-CM

## 2018-09-12 IMAGING — US ULTRASOUND RIGHT BREAST LIMITED
1 series · 4 of 4 positions shown · non-contrast
Comparison: Previous exam(s).

CLINICAL DATA: 46-year-old female presenting for evaluation of an
area of indentation running from the lateral to inferior aspect of
the right breast which she identified approximately 2 weeks ago. She
is also feeling a small mass in the lateral aspect of the right
breast. The patient is status post left breast mastectomy in 3255.
Per the clinical indication for the exam, there is concern for
rupture of the right silicone implant.

EXAM:
2D DIGITAL DIAGNOSTIC RIGHT MAMMOGRAM WITH IMPLANTS, CAD AND ADJUNCT
TOMO
The patient has retropectoral implants. Standard and implant
displaced views were performed.
RIGHT BREAST ULTRASOUND

[Series 1: ultrasound right breast limited · 0.05mm/px · 4 of 4 slices shown]
[im 1/4]
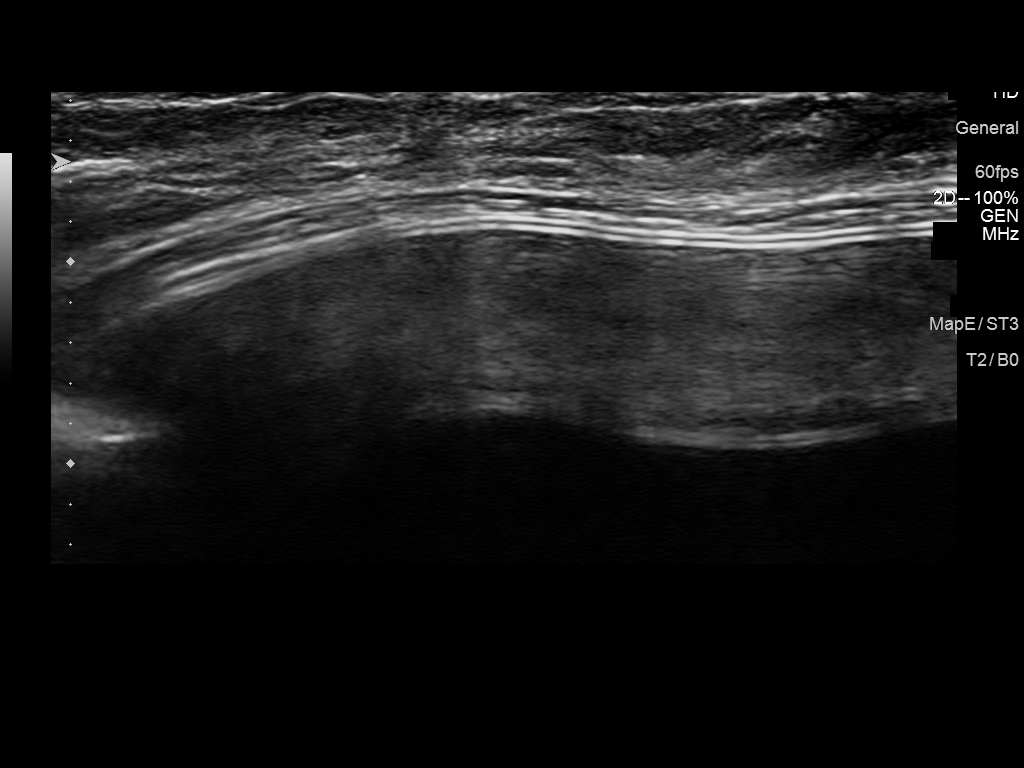
[im 2/4]
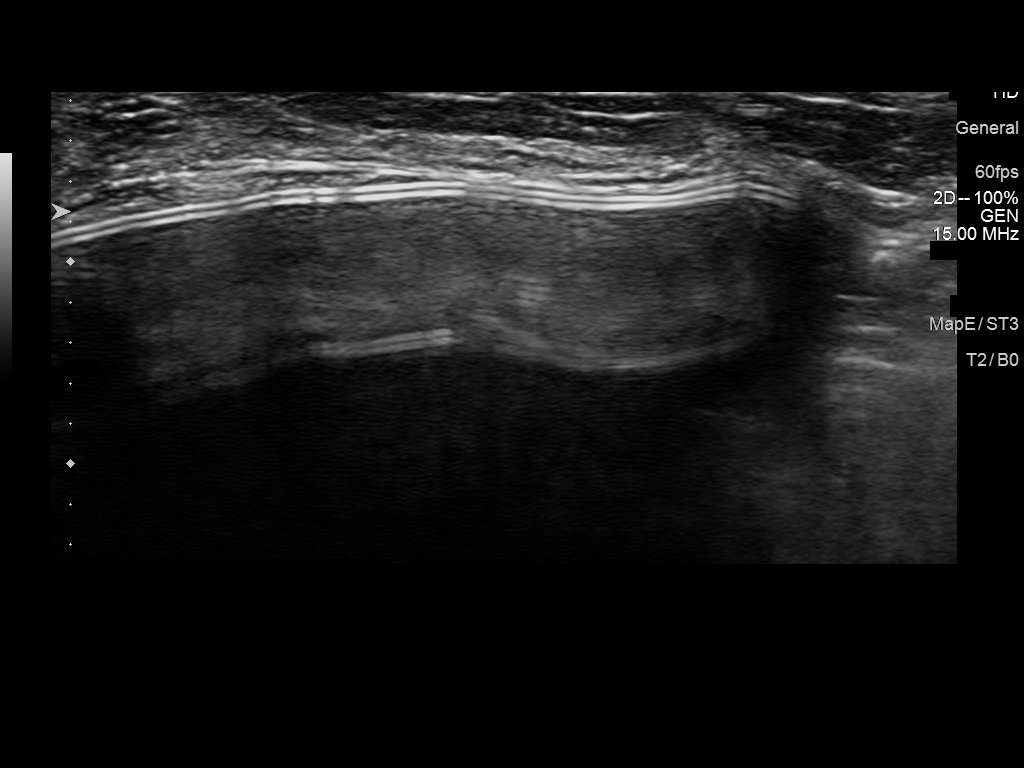
[im 3/4]
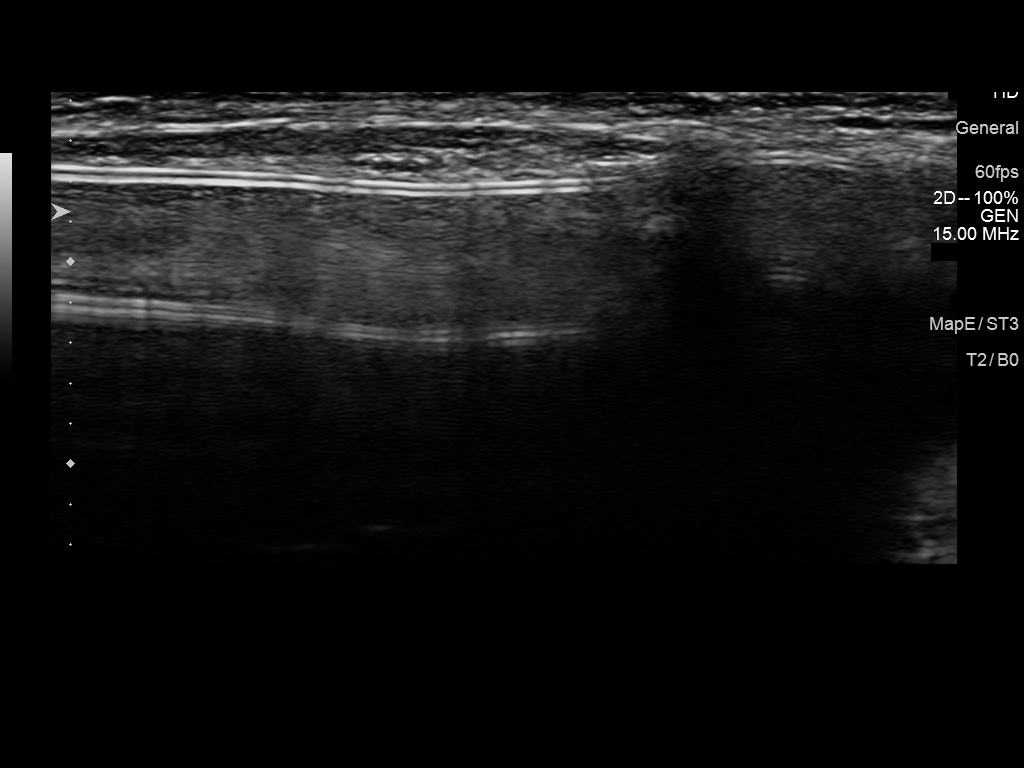
[im 4/4]
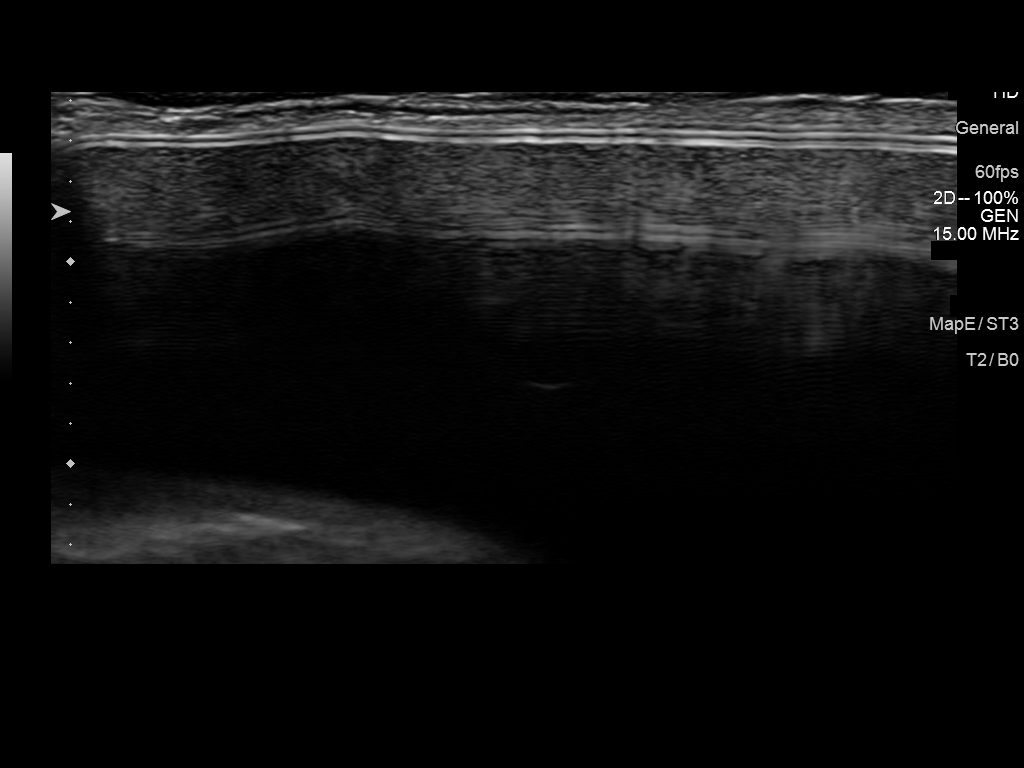

[4 of 4 positions shown; findings below may reference images not displayed]

ACR Breast Density Category c: The breast tissue is heterogeneously
dense, which may obscure small masses.
FINDINGS: A BB has been placed on the lateral aspect of the right breast.
Immediately deep to the palpable marker in the superficial aspect of
the breast there is a circumscribed oval mass measuring
approximately 4 mm. There is no evidence of extracapsular silicone
on the mammogram images. No other suspicious calcifications, masses
or areas of distortion are seen in the reconstructed right breast.

Mammographic images were processed with CAD.

Physical exam of the right breast demonstrates an indentation
running along the lateral and inferior aspect of the right breast.
No discrete palpable lumps can be identified.

Ultrasound of the palpable site in the lateral right breast
demonstrates normal fibroglandular tissue. The tiny oval mass
identified mammographically cannot be seen. Ultrasound of the entire
right breast for evaluation of rupture demonstrates no evidence of
extracapsular silicone.
IMPRESSION: 1. There is a probably benign mass in the right breast which
measures 4 mm at the palpable site of concern. On the mammogram this
appears to possibly be within the skin, however no skin lesion is
identified on physical exam.

2. There is no evidence of extracapsular rupture of the right
breast.

3. No findings to explain the new indentation in the lateral and
inferior right breast.

RECOMMENDATION:
1. Bilateral breast MRI is recommended with and without contrast and
with implant rupture protocol. This will be able to evaluate for
intracapsular rupture of the right implant which cannot typically be
detected on mammogram or ultrasound. We may also evaluate the breast
tissue for any potential causes of the indentation of the breast.

2. If the breast MRI is normal, then a six-month follow-up right
breast mammogram will be recommended to ensure stability of the 4 mm
mass in the lateral right breast.

I have discussed the findings and recommendations with the patient.
Results were also provided in writing at the conclusion of the
visit. If applicable, a reminder letter will be sent to the patient
regarding the next appointment.

BI-RADS CATEGORY  3: Probably benign.

## 2018-10-02 ENCOUNTER — Inpatient Hospital Stay (HOSPITAL_COMMUNITY): Payer: PRIVATE HEALTH INSURANCE

## 2018-10-02 ENCOUNTER — Encounter (HOSPITAL_COMMUNITY): Payer: Self-pay | Admitting: Internal Medicine

## 2018-10-02 ENCOUNTER — Other Ambulatory Visit: Payer: Self-pay

## 2018-10-02 ENCOUNTER — Inpatient Hospital Stay (HOSPITAL_COMMUNITY): Payer: PRIVATE HEALTH INSURANCE | Attending: Hematology

## 2018-10-02 ENCOUNTER — Inpatient Hospital Stay (HOSPITAL_BASED_OUTPATIENT_CLINIC_OR_DEPARTMENT_OTHER): Payer: PRIVATE HEALTH INSURANCE | Admitting: Internal Medicine

## 2018-10-02 VITALS — BP 124/85 | HR 84 | Temp 98.9°F | Resp 16 | Wt 120.0 lb

## 2018-10-02 DIAGNOSIS — Z79811 Long term (current) use of aromatase inhibitors: Secondary | ICD-10-CM

## 2018-10-02 DIAGNOSIS — Z9012 Acquired absence of left breast and nipple: Secondary | ICD-10-CM

## 2018-10-02 DIAGNOSIS — C50912 Malignant neoplasm of unspecified site of left female breast: Secondary | ICD-10-CM

## 2018-10-02 DIAGNOSIS — R0602 Shortness of breath: Secondary | ICD-10-CM | POA: Insufficient documentation

## 2018-10-02 DIAGNOSIS — K769 Liver disease, unspecified: Secondary | ICD-10-CM | POA: Insufficient documentation

## 2018-10-02 DIAGNOSIS — M8580 Other specified disorders of bone density and structure, unspecified site: Secondary | ICD-10-CM | POA: Diagnosis not present

## 2018-10-02 DIAGNOSIS — Z170 Estrogen receptor positive status [ER+]: Secondary | ICD-10-CM | POA: Diagnosis not present

## 2018-10-02 DIAGNOSIS — M85852 Other specified disorders of bone density and structure, left thigh: Secondary | ICD-10-CM

## 2018-10-02 LAB — CBC WITH DIFFERENTIAL/PLATELET
ABS IMMATURE GRANULOCYTES: 0.01 10*3/uL (ref 0.00–0.07)
Basophils Absolute: 0 10*3/uL (ref 0.0–0.1)
Basophils Relative: 0 %
Eosinophils Absolute: 0.1 10*3/uL (ref 0.0–0.5)
Eosinophils Relative: 1 %
HCT: 41.3 % (ref 36.0–46.0)
HEMOGLOBIN: 14 g/dL (ref 12.0–15.0)
Immature Granulocytes: 0 %
Lymphocytes Relative: 35 %
Lymphs Abs: 1.7 10*3/uL (ref 0.7–4.0)
MCH: 35.1 pg — ABNORMAL HIGH (ref 26.0–34.0)
MCHC: 33.9 g/dL (ref 30.0–36.0)
MCV: 103.5 fL — ABNORMAL HIGH (ref 80.0–100.0)
Monocytes Absolute: 0.6 10*3/uL (ref 0.1–1.0)
Monocytes Relative: 12 %
NEUTROS ABS: 2.5 10*3/uL (ref 1.7–7.7)
NEUTROS PCT: 52 %
Platelets: 142 10*3/uL — ABNORMAL LOW (ref 150–400)
RBC: 3.99 MIL/uL (ref 3.87–5.11)
RDW: 12.1 % (ref 11.5–15.5)
WBC: 4.8 10*3/uL (ref 4.0–10.5)
nRBC: 0 % (ref 0.0–0.2)

## 2018-10-02 LAB — COMPREHENSIVE METABOLIC PANEL
ALT: 18 U/L (ref 0–44)
AST: 27 U/L (ref 15–41)
Albumin: 3.7 g/dL (ref 3.5–5.0)
Alkaline Phosphatase: 69 U/L (ref 38–126)
Anion gap: 11 (ref 5–15)
BUN: 14 mg/dL (ref 6–20)
CO2: 24 mmol/L (ref 22–32)
Calcium: 9.3 mg/dL (ref 8.9–10.3)
Chloride: 98 mmol/L (ref 98–111)
Creatinine, Ser: 0.78 mg/dL (ref 0.44–1.00)
GFR calc Af Amer: >60 mL/min (ref >60)
GFR calc non Af Amer: >60 mL/min (ref >60)
Glucose, Bld: 96 mg/dL (ref 70–99)
Potassium: 3.3 mmol/L — ABNORMAL LOW (ref 3.5–5.1)
Sodium: 133 mmol/L — ABNORMAL LOW (ref 135–145)
Total Bilirubin: <0.1 mg/dL — ABNORMAL LOW (ref 0.3–1.2)
Total Protein: 6.7 g/dL (ref 6.5–8.1)

## 2018-10-02 MED ORDER — DENOSUMAB 60 MG/ML ~~LOC~~ SOSY
60.0000 mg | PREFILLED_SYRINGE | Freq: Once | SUBCUTANEOUS | Status: AC
  Administered 2018-10-02: 60 mg via SUBCUTANEOUS
  Filled 2018-10-02: qty 1

## 2018-10-02 NOTE — Progress Notes (Signed)
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 Stage III (T3N1G3) left breast cancer:Pt is followed by Dr. Worthy Keeler.   -Left mastectomy and latissimus dorsi flap reconstruction on 10/08/2009, 2 out of 3 lymph nodes positive, Ki-67 20%, ER/PR positive and HER-2 negative - Status post epirubicin and Cytoxan x4 cycles followed by weekly Taxol x12, status post XRT finished on 06/19/2010, tamoxifen started on 06/13/2010 -Switched to Arimidex on 04/03/2014, continues to tolerate it well.  She will continue Arimidex until November 2021. -MRI of the right breast in November 2018 was within normal limits.  We will schedule her for right breast mammogram in November.  2.  Abdominal pain.  Pt had CT abdomen and pelvis done 04/14/2018 that showed  IMPRESSION: Circumferential thickening of the pylorus, nonspecific, but may be infectious/inflammatory.  Stable subcentimeter hypoattenuated circumscribed nodule in the left lobe of the liver, too small to be actually characterize by CT.  2 mm nonobstructive right renal calculus.  Advanced for age aortic atherosclerosis.  L5-S1 spondylosis.  She continues to complain of abdominal pain.  She has stable nodule noted on CT.   She will be set up for MRI of abdomen.  If negative, pt will be referred to GI for evaluation.    3.  Shortness of breath and cough.  CXR for further evaluation.    4.  Osteopenia: -DEXA scan in March 2018 showed T score of -1.6 consistent with osteopenia.  She is receiving Prolia injections every 6 months.  Labs done 10/02/2018 reviewed and showed WBC 4.8 HB 14 plts 142,000.   Chemistries WNL with Cr 0.78 and normal LFTs.  She should continue calcium and vitamin D supplements.  5.  Genetic testing: -Sister was diagnosed with metastatic breast cancer and was found to have PALB2 mutation.  Patient was also tested positive for the same mutation.  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  She is at high risk for breast cancer, ovarian cancer, pancreatic cancer and prostate cancer in males. -Patient's youngest daughter was also positive for the same mutation. -Pt reports her sister died recently.  She is referred to cancer survivor support group.  Will determine if cancer genetics or social work can recommended other local support group options.    25 minutes spent with more than 50% spent in review of records, counseling and coordination of care.     Current Status:  Pt is seen today for follow-up.  She reports she recently lost a sister from breast cancer.  Pt is reporting some cough and SOB.      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    Initial Diagnosis    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    10/22/2016 Imaging    Bone density-BMD as determined from Femur Neck Right is 0.822 g/cm2 with a T-Score of -1.6.  This patient is considered OSTEOPENIC according to [facility]) criteria.      Problem List Patient Active Problem List   Diagnosis Date Noted  . Atherosclerosis of aorta ([place]) [I70.0] 07/17/2018  . Genetic testing [Z13.79]   . 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  Family history of breast cancer in sister [Z80.3]   . Rupture of posterior cruciate ligament of left knee [S83.522A] 04/20/2016  . Tear of lateral collateral ligament of left knee [S83.422A] 04/20/2016  . Acute medial meniscus tear of left knee [S83.242A] 04/20/2016  . Macrocytosis without anemia [D75.89] 09/27/2015  . ADD (attention deficit disorder) [F98.8] 11/07/2014  . Osteopenia [M85.80] 09/01/2014  . Severe major depression  ([place]) [F32.2] 08/15/2014  . PTSD (post-traumatic stress disorder) [F43.10] 08/15/2014  . 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 [C50.919]   . Thyroid disease [E07.9]   . Hypothyroidism [E03.9] 02/24/2009  . GRAVES DISEASE [E05.00] 04/26/2008  . HYPERLIPIDEMIA [E78.5] 04/01/2008  . Depression [F32.9] 04/01/2008  . TREMOR [R25.9] 04/01/2008  . Burt Knack OF [R00.762] 04/01/2008    Past Medical History Past Medical History:  Diagnosis Date  . Acute medial meniscus tear of left knee 04/20/2016  . Breast cancer ([place])    2010 ; left side mastectomy chemo pill  . Depression   . DVT (deep venous thrombosis) (HCC)    left arm  . Family history of breast cancer in sister    sister with PALB2 mutation  . 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 Hypothyroidism   . 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 Osteopenia 09/01/2014  . PTSD (post-traumatic stress disorder) 08/15/2014  . Rupture of posterior cruciate ligament of left knee 04/20/2016  . Severe major depression ([place]) 08/15/2014  . Thyroid disease     Past Surgical History Past Surgical History:  Procedure Laterality Date  . BREAST CAPSULECTOMY WITH IMPLANT EXCHANGE Left 08/19/2011  . BREAST RECONSTRUCTION Left 01/15/2011  . MINOR IRRIGATION AND DEBRIDEMENT OF WOUND Left 03/05/2011   back and left breast  . MODIFIED RADICAL MASTECTOMY Left 10/08/2009  . PLACEMENT OF BREAST IMPLANTS Right 08/19/2011  . PORT-A-CATH REMOVAL  07/15/2010  . PORTACATH PLACEMENT  11/14/2009   with left breast wound revision    Family History Family History  Problem Relation Age of Onset  . Hypertension Mother   . Heart disease Mother   . COPD Mother   . Other Mother        Cow valve  . Diabetes Father   . Stroke Father 27  . Hypertension Father   . Hypertension Maternal Grandmother   . Diabetes Paternal  Grandmother   . Lung cancer Paternal Grandfather        deceased 47s; smoker  . Breast cancer Sister 63       currently 89; PALB2 mutation/ deceased  . Hypertension Brother      Social History  reports that she quit smoking about 8 years ago. She has a 7.00 pack-year smoking history. She has never used smokeless tobacco. She reports previous alcohol use. She reports that she does not use drugs.  Medications  Current Outpatient Medications:  .  amphetamine-dextroamphetamine (ADDERALL) 20 MG tablet, Take 1 tablet (20 mg total) by mouth 2 (two) times daily., Disp: 60 tablet, Rfl: 0 .  anastrozole (ARIMIDEX) 1 MG tablet, TAKE 1 TABLET BY MOUTH EVERY DAY, Disp: 90 tablet, Rfl: 0 .  buPROPion (WELLBUTRIN XL) 150 MG 24 hr tablet, TAKE 1 TABLET (150 MG TOTAL) BY MOUTH DAILY., Disp: 90 tablet, Rfl: 3 .  calcium-vitamin D (OSCAL WITH D) 500-200 MG-UNIT per tablet, Take 1 tablet by mouth 2 (two) times daily., Disp: 60 tablet, Rfl: 11 .  denosumab (PROLIA) 60 MG/ML SOSY injection, Inject 60 mg into the skin every 6 (six) months., Disp: , Rfl:  .  levothyroxine (SYNTHROID, LEVOTHROID) 100 MCG tablet, Take 1 tablet (100 mcg total) by mouth daily., Disp: 30 tablet, Rfl: 11  Allergies Patient has no known allergies.  Review of Systems Review of Systems - Oncology ROS negative   Physical Exam  Vitals Wt Readings from Last 3 Encounters:  10/02/18 120 lb (54.4 kg)  07/17/18 120 lb 8 oz (54.7 kg)  06/19/18 116 lb 6.4 oz (52.8 kg)   Temp Readings from Last 3 Encounters:  10/02/18 98.9 F (37.2 C) (Oral)  07/17/18 (!) 97.3 F (36.3 C)  06/19/18 98.4 F (36.9 C) (Oral)   BP Readings from Last 3 Encounters:  10/02/18 124/85  07/17/18 134/90  06/19/18 116/80   Pulse Readings from Last 3 Encounters:  10/02/18 84  07/17/18 90  06/19/18 79   Constitutional: Well-developed, well-nourished, and in no distress.   HENT: Head: Normocephalic and atraumatic.  Mouth/Throat: No oropharyngeal  exudate. Mucosa moist. Eyes: Pupils are equal, round, and reactive to light. Conjunctivae are normal. No scleral icterus.  Neck: Normal range of motion. Neck supple. No JVD present.  Cardiovascular: Normal rate, regular rhythm and normal heart sounds.  Exam reveals no gallop and no friction rub.   No murmur heard. Pulmonary/Chest: Effort normal and breath sounds normal. No respiratory distress. No wheezes.No rales.  Abdominal: Soft. Bowel sounds are normal. No distension. There is no tenderness. There is no guarding.  Musculoskeletal: No edema or tenderness.  Lymphadenopathy: No cervical, axillary or supraclavicular adenopathy.  Neurological: Alert and oriented to person, place, and time. No cranial nerve deficit.  Skin: Skin is warm and dry. No rash noted. No erythema. No pallor.  Psychiatric: Affect and judgment normal.  Breast cancer.  Chaperone present.  Bilateral reconstruction with breast implants noted.    Labs Appointment on 10/02/2018  Component Date Value Ref Range Status  . WBC 10/02/2018 4.8  4.0 - 10.5 K/uL Final  . RBC 10/02/2018 3.99  3.87 - 5.11 MIL/uL Final  . Hemoglobin 10/02/2018 14.0  12.0 - 15.0 g/dL Final  . HCT 10/02/2018 41.3  36.0 - 46.0 % Final  . MCV 10/02/2018 103.5* 80.0 - 100.0 fL Final  . MCH 10/02/2018 35.1* 26.0 - 34.0 pg Final  . MCHC 10/02/2018 33.9  30.0 - 36.0 g/dL Final  . RDW 10/02/2018 12.1  11.5 - 15.5 % Final  . Platelets 10/02/2018 142* 150 - 400 K/uL Final  . nRBC 10/02/2018 0.0  0.0 - 0.2 % Final  . Neutrophils Relative % 10/02/2018 52  % Final  . Neutro Abs 10/02/2018 2.5  1.7 - 7.7 K/uL Final  . Lymphocytes Relative 10/02/2018 35  % Final  . Lymphs Abs 10/02/2018 1.7  0.7 - 4.0 K/uL Final  . Monocytes Relative 10/02/2018 12  % Final  . Monocytes Absolute 10/02/2018 0.6  0.1 - 1.0 K/uL Final  . Eosinophils Relative 10/02/2018 1  % Final  . Eosinophils Absolute 10/02/2018 0.1  0.0 - 0.5 K/uL Final  . Basophils Relative 10/02/2018 0  %  Final  . Basophils Absolute 10/02/2018 0.0  0.0 - 0.1 K/uL Final  . Immature Granulocytes 10/02/2018 0  % Final  . Abs Immature Granulocytes 10/02/2018 0.01  0.00 - 0.07 K/uL Final   Performed at [facility], [address] [place], [place] [address]  . Sodium 10/02/2018 133* 135 - 145 mmol/L Final  . Potassium 10/02/2018 3.3*  3.5 - 5.1 mmol/L Final  . Chloride 10/02/2018 98  98 - 111 mmol/L Final  . CO2 10/02/2018 24  22 - 32 mmol/L Final  . Glucose, Bld 10/02/2018 96  70 - 99 mg/dL Final  . BUN 10/02/2018 14  6 - 20 mg/dL Final  . Creatinine, Ser 10/02/2018 0.78  0.44 - 1.00 mg/dL Final  . Calcium 10/02/2018 9.3  8.9 - 10.3 mg/dL Final  . Total Protein 10/02/2018 6.7  6.5 - 8.1 g/dL Final  . Albumin 10/02/2018 3.7  3.5 - 5.0 g/dL Final  . AST 10/02/2018 27  15 - 41 U/L Final  . ALT 10/02/2018 18  0 - 44 U/L Final  . Alkaline Phosphatase 10/02/2018 69  38 - 126 U/L Final  . Total Bilirubin 10/02/2018 <0.1* 0.3 - 1.2 mg/dL Final  . GFR calc non Af Amer 10/02/2018 >60  >60 mL/min Final  . GFR calc Af Amer 10/02/2018 >60  >60 mL/min Final  . Anion gap 10/02/2018 11  5 - 15 Final   Performed at [facility], [address], [place], [place] [address]     Pathology Orders Placed This Encounter  Procedures  . MR Abdomen W Wo Contrast    Standing Status:   Future    Standing Expiration Date:   10/02/2019    Order Specific Question:   If indicated for the ordered procedure, I authorize the administration of contrast media per Radiology protocol    Answer:   Yes    Order Specific Question:   What is the patient's sedation requirement?    Answer:   No Sedation    Order Specific Question:   Does the patient have a pacemaker or implanted devices?    Answer:   No    Order Specific Question:   Radiology Contrast Protocol - do NOT remove file path    Answer:   \\[email or web address]    Order Specific Question:   Preferred imaging location?    Answer:   [facility] (table limit-350lbs)  . MM 3D SCREEN BREAST W/IMPLANT UNI RIGHT    Standing Status:   Future    Standing Expiration Date:   12/01/2019    Order Specific Question:   Reason for Exam (SYMPTOM  OR DIAGNOSIS REQUIRED)    Answer:   left breast cancer    Order Specific Question:   Is the patient pregnant?    Answer:   No    Order Specific Question:   Preferred imaging location?    Answer:   [facility]  . DG Chest 2 View    Standing Status:   Future    Standing Expiration Date:   10/02/2019    Order Specific Question:   Reason for Exam (SYMPTOM  OR DIAGNOSIS REQUIRED)    Answer:   shortness of breath and cough    Order Specific Question:   Is patient pregnant?    Answer:   No    Order Specific Question:   Preferred imaging location?    Answer:   [facility]    Order Specific Question:   Radiology Contrast Protocol - do NOT remove file path    Answer:   [email or web address]       Zoila Shutter MD

## 2018-10-02 NOTE — Patient Instructions (Signed)
[facility] at [facility] Discharge Instructions  Received Prolia injection today. Follow-up as scheduled. Call clinic for any questions or concerns   Thank you for choosing [facility] at [facility] to provide your oncology and hematology care.  To afford each patient quality time with our provider, please arrive at least 15 minutes before your scheduled appointment time.   If you have a lab appointment with the Cancer Center please come in thru the  Main Entrance and check in at the main information desk  You need to re-schedule your appointment should you arrive 10 or more minutes late.  We strive to give you quality time with our providers, and arriving late affects you and other patients whose appointments are after yours.  Also, if you no show three or more times for appointments you may be dismissed from the clinic at the providers discretion.     Again, thank you for choosing [facility].  Our hope is that these requests will decrease the amount of time that you wait before being seen by our physicians.       _____________________________________________________________  Should you have questions after your visit to  Cancer Center, please contact our office at [phone] between the hours of 8:00 a.m. and 4:30 p.m.  Voicemails left after 4:00 p.m. will not be returned until the following business day.  For prescription refill requests, have your pharmacy contact our office and allow 72 hours.    Cancer Center Support Programs:   > Cancer Support Group  2nd Tuesday of the month 1pm-2pm, Journey Room   

## 2018-10-02 NOTE — Progress Notes (Signed)
[name] tolerated Prolia injection well without complaints or incident. Calcium 9.3 today and pt denied any tooth or jaw pain and no recent or future dental visits. Pt continues to take her Calcium PO as prescribed without issues. Pt discharged self ambulatory in satisfactory condition

## 2018-10-09 ENCOUNTER — Other Ambulatory Visit: Payer: Self-pay | Admitting: Family Medicine

## 2018-10-10 ENCOUNTER — Ambulatory Visit (HOSPITAL_COMMUNITY)
Admission: RE | Admit: 2018-10-10 | Discharge: 2018-10-10 | Disposition: A | Discharge status: Home / Self Care | Payer: PRIVATE HEALTH INSURANCE | Source: Ambulatory Visit | Attending: Internal Medicine | Admitting: Internal Medicine

## 2018-10-10 ENCOUNTER — Telehealth (HOSPITAL_COMMUNITY): Payer: Self-pay

## 2018-10-10 DIAGNOSIS — K769 Liver disease, unspecified: Secondary | ICD-10-CM | POA: Insufficient documentation

## 2018-10-10 MED ORDER — GADOBUTROL 1 MMOL/ML IV SOLN
5.0000 mL | Freq: Once | INTRAVENOUS | Status: AC | PRN
  Administered 2018-10-10: 5 mL via INTRAVENOUS

## 2018-10-10 NOTE — Telephone Encounter (Signed)
Called the patient with results of MRI per Dr. Walden Field.  Explained the MRI showed a liver cyst and asked the patient if she has had any abdominal pain.  Patient stated she has prn discomfort but no worsening.  Patient stated she would wait on the GI referral and would call us back if she decides to see a gastroenterologist and would let us know what doctor she would like to see.

## 2019-01-05 ENCOUNTER — Other Ambulatory Visit: Payer: Self-pay | Admitting: Family Medicine

## 2019-01-05 NOTE — Telephone Encounter (Signed)
Left message for patient to call back. Patient is due for follow up.

## 2019-01-10 ENCOUNTER — Ambulatory Visit (INDEPENDENT_AMBULATORY_CARE_PROVIDER_SITE_OTHER): Payer: No Typology Code available for payment source | Admitting: Family Medicine

## 2019-01-10 ENCOUNTER — Encounter: Payer: Self-pay | Admitting: Family Medicine

## 2019-01-10 ENCOUNTER — Other Ambulatory Visit: Payer: Self-pay

## 2019-01-10 VITALS — BP 122/78 | HR 90 | Temp 98.1°F | Wt 125.0 lb

## 2019-01-10 DIAGNOSIS — H109 Unspecified conjunctivitis: Secondary | ICD-10-CM | POA: Diagnosis not present

## 2019-01-10 MED ORDER — CIPROFLOXACIN HCL 0.3 % OP SOLN: 1.0000 [drp] | OPHTHALMIC | 0 refills | Status: DC

## 2019-01-10 NOTE — Progress Notes (Signed)
Subjective:     Renee [name] is a 48 y.o. female presenting for Conjunctivitis (Left eye started yesterday with itching. This morning it was draining and almost shut)     HPI   #Red eye - felt itchy yesterday - this morning work up with eye closed shut with greenish mucus - only on the left side - no allergy symptoms - no runny nose, cough, sneezing - no know sick contacts - feels like someone is scratching when she blinks - very watery - no know trauma to the eye - wears contact lenses  Review of Systems  Constitutional: Negative for chills and fever.  Respiratory: Negative for cough.      Social History   Tobacco Use  Smoking Status Former Smoker  . Packs/day: 0.50  . Years: 14.00  . Pack years: 7.00  . Last attempt to quit: 09/22/2010  . Years since quitting: 8.3  Smokeless Tobacco Never Used        Objective:    BP Readings from Last 3 Encounters:  01/10/19 122/78  10/02/18 124/85  07/17/18 134/90   Wt Readings from Last 3 Encounters:  01/10/19 125 lb (56.7 kg)  10/02/18 120 lb (54.4 kg)  07/17/18 120 lb 8 oz (54.7 kg)    BP 122/78 (BP Location: Right Arm, Patient Position: Sitting, Cuff Size: Normal)   Pulse 90   Temp 98.1 F (36.7 C) (Oral)   Wt 125 lb (56.7 kg)   SpO2 98%   BMI 24.01 kg/m    Physical Exam Constitutional:      General: She is not in acute distress.    Appearance: She is well-developed. She is not diaphoretic.  HENT:     Right Ear: External ear normal.     Left Ear: External ear normal.     Nose: Nose normal.  Eyes:     General: Lids are normal. Vision grossly intact. No scleral icterus.       Left eye: Discharge present.    Extraocular Movements: Extraocular movements intact.     Conjunctiva/sclera:     Left eye: Left conjunctiva is injected.     Comments: Sensitivity to light  Neck:     Musculoskeletal: Neck supple.  Cardiovascular:     Rate and Rhythm: Normal rate and regular rhythm.     Heart sounds: No  murmur.  Pulmonary:     Effort: Pulmonary effort is normal. No respiratory distress.     Breath sounds: Normal breath sounds. No wheezing.  Skin:    General: Skin is warm and dry.     Capillary Refill: Capillary refill takes less than 2 seconds.  Neurological:     Mental Status: She is alert. Mental status is at baseline.  Psychiatric:        Mood and Affect: Mood normal.        Behavior: Behavior normal.           Assessment & Plan:   Problem List Items Addressed This Visit    None    Visit Diagnoses    Bacterial conjunctivitis of left eye    -  Primary   Relevant Medications   ciprofloxacin (CILOXAN) 0.3 % ophthalmic solution     Discussed that we could do a test to look for abrasion but patient primarily with itching symptoms and discharge. Will do abx first if not improvement may need to re-evaluate   Return if symptoms worsen or fail to improve.  Lesleigh Noe, MD

## 2019-01-10 NOTE — Patient Instructions (Signed)
Bacterial Conjunctivitis, Adult  Bacterial conjunctivitis is an infection of your conjunctiva. This is the clear membrane that covers the white part of your eye and the inner part of your eyelid. This infection can make your eye:  · Red or pink.  · Itchy.  This condition spreads easily from person to person (is contagious) and from one eye to the other eye.  What are the causes?  · This condition is caused by germs (bacteria). You may get the infection if you come into close contact with:  ? A person who has the infection.  ? Items that have germs on them (are contaminated), such as face towels, contact lens solution, or eye makeup.  What increases the risk?  You are more likely to get this condition if you:  · Have contact with people who have the infection.  · Wear contact lenses.  · Have a sinus infection.  · Have had a recent eye injury or surgery.  · Have a weak body defense system (immune system).  · Have dry eyes.  What are the signs or symptoms?    · Thick, yellowish discharge from the eye.  · Tearing or watery eyes.  · Itchy eyes.  · Burning feeling in your eyes.  · Eye redness.  · Swollen eyelids.  · Blurred vision.  How is this treated?    · Antibiotic eye drops or ointment.  · Antibiotic medicine taken by mouth. This is used for infections that do not get better with drops or ointment or that last more than 10 days.  · Cool, wet cloths placed on the eyes.  · Artificial tears used 2-6 times a day.  Follow these instructions at home:  Medicines  · Take or apply your antibiotic medicine as told by your doctor. Do not stop taking or applying the antibiotic even if you start to feel better.  · Take or apply over-the-counter and prescription medicines only as told by your doctor.  · Do not touch your eyelid with the eye-drop bottle or the ointment tube.  Managing discomfort  · Wipe any fluid from your eye with a warm, wet washcloth or a cotton ball.  · Place a clean, cool, wet cloth on your eye. Do this for  10-20 minutes, 3-4 times per day.  General instructions  · Do not wear contacts until the infection is gone. Wear glasses until your doctor says it is okay to wear contacts again.  · Do not wear eye makeup until the infection is gone. Throw away old eye makeup.  · Change or wash your pillowcase every day.  · Do not share towels or washcloths.  · Wash your hands often with soap and water. Use paper towels to dry your hands.  · Do not touch or rub your eyes.  · Do not drive or use heavy machinery if your vision is blurred.  Contact a doctor if:  · You have a fever.  · You do not get better after 10 days.  Get help right away if:  · You have a fever and your symptoms get worse all of a sudden.  · You have very bad pain when you move your eye.  · Your face:  ? Hurts.  ? Is red.  ? Is swollen.  · You have sudden loss of vision.  Summary  · Bacterial conjunctivitis is an infection of your conjunctiva.  · This infection spreads easily from person to person.  · Wash your hands often   with soap and water. Use paper towels to dry your hands.  · Take or apply your antibiotic medicine as told by your doctor.  · Contact a doctor if you have a fever or you do not get better after 10 days.  This information is not intended to replace advice given to you by your health care provider. Make sure you discuss any questions you have with your health care provider.  Document Released: 05/04/2008 Document Revised: 03/01/2018 Document Reviewed: 03/01/2018  Elsevier Interactive Patient Education © 2019 [employer] Inc[employer]

## 2019-01-24 ENCOUNTER — Other Ambulatory Visit: Payer: Self-pay | Admitting: Family Medicine

## 2019-02-16 ENCOUNTER — Other Ambulatory Visit: Payer: Self-pay | Admitting: Family Medicine

## 2019-02-16 NOTE — Telephone Encounter (Signed)
Patient does not have future appointments scheduled. Last office visit was for acute visit.

## 2019-03-27 ENCOUNTER — Other Ambulatory Visit: Payer: Self-pay | Admitting: Family Medicine

## 2019-03-27 DIAGNOSIS — F331 Major depressive disorder, recurrent, moderate: Secondary | ICD-10-CM

## 2019-03-27 NOTE — Telephone Encounter (Signed)
It appears that she has been taking Wellbutrin prior to her establishing care with Dr. Einar Pheasant. Refill sent to pharmacy.

## 2019-03-27 NOTE — Telephone Encounter (Signed)
Last office visit 01/10/2019 for bacterial conjunctivitis.  Prior to that 07/18/2018 where patient established care with Dr. Christ Kick and  AVS states to follow up in 4 week when due for ADHD refill. .  No future appointments.  Ok to refill?

## 2019-03-28 ENCOUNTER — Other Ambulatory Visit: Payer: Self-pay | Admitting: Internal Medicine

## 2019-03-28 ENCOUNTER — Encounter: Payer: Self-pay | Admitting: Primary Care

## 2019-03-28 ENCOUNTER — Other Ambulatory Visit: Payer: Self-pay

## 2019-03-28 ENCOUNTER — Ambulatory Visit (INDEPENDENT_AMBULATORY_CARE_PROVIDER_SITE_OTHER): Payer: No Typology Code available for payment source | Admitting: Primary Care

## 2019-03-28 DIAGNOSIS — Z20822 Contact with and (suspected) exposure to covid-19: Secondary | ICD-10-CM

## 2019-03-28 DIAGNOSIS — R059 Cough, unspecified: Secondary | ICD-10-CM | POA: Insufficient documentation

## 2019-03-28 DIAGNOSIS — R05 Cough: Secondary | ICD-10-CM

## 2019-03-28 NOTE — Patient Instructions (Signed)
Go to the [employer] building at [employer] for [employer].  Stay home and out of work until further advised.  You can try a few things over the counter to help with your symptoms including:  Cough: Delsym or Robitussin (get the off brand, works just as well) Chest Congestion: Mucinex (plain) Nasal Congestion/Ear Pressure/Sinus Pressure: Try using Flonase (fluticasone) nasal spray. Instill 1 spray in each nostril twice daily. This can be purchased over the counter. Body aches, fevers, headache: Acetaminophen-Tylenol (not to exceed 3000 mg in 24 hours) Runny Nose/Throat Drainage/Sneezing/Itchy or Watery Eyes: An antihistamine such as Zyrtec, Claritin, Xyzal, Allegra  It was a pleasure meeting you! Allie Bossier, NP-C

## 2019-03-28 NOTE — Progress Notes (Signed)
Subjective:    Patient ID: Renee Renee [name], female    DOB: 08-21-1970, 48 y.o.   MRN: [id number]  HPI  Virtual Visit via Video Note  I connected with Renee Renee [name] on 03/28/19 at  8:20 AM EDT by a video enabled telemedicine application and verified that I am speaking with the correct person using two identifiers.  Location: Patient: Home Provider: Office   I discussed the limitations of evaluation and management by telemedicine and the availability of in person appointments. The patient expressed understanding and agreed to proceed.  History of Present Illness:  Renee Renee [name] is a 48 year old female patient of Dr. Einar Pheasant with a history of Graves Disease, hypothyroidism, PTSD/depression/ADD, tobacco abuse who presents today with a chief complaint of cough.  She also reports headache, chills, fever, diarrhea this morning. Her symptoms began two days ago. She had a Covid-19 test one week ago due to state mandates (had no symptoms at that point) and was told yesterday that her test was negative. 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  Her cough is dry. Her fevers are ranging 99.-100.3. She's taken Ibuprofen with some improvement.   Observations/Objective:  Alert and oriented. Appears ill/tired. No distress. Speaking in complete sentences. Dry cough noted during exam  Assessment and Plan:  Symptoms present for the last 48 hours. Suspicious for Covid, especially given her occupation. Other differentials include URI, pneumonia. Lower suspicion for pneumonia at this point given lower grade fevers, presentation, non productive cough.  Start with retesting for Covid-19. Information provided. Discussed home care treatment, quarantine, note for work provided. Hospital precautions provided. If Covid test negative then consider xray to rule out pneumonia.    Follow Up Instructions:  Go to the [employer] building at [place] for [employer].  Stay home and out of  work until further advised.  You can try a few things over the counter to help with your symptoms including:  Cough: Delsym or Robitussin (get the off brand, works Renee [name] as well) Chest Congestion: Mucinex (plain) Nasal Congestion/Ear Pressure/Sinus Pressure: Try using Flonase (fluticasone) nasal spray. Instill 1 spray in each nostril twice daily. This can be purchased over the counter. Body aches, fevers, headache: Acetaminophen-Tylenol (not to exceed 3000 mg in 24 hours) Runny Nose/Throat Drainage/Sneezing/Itchy or Watery Eyes: An antihistamine such as Zyrtec, Claritin, Xyzal, Allegra  It was a pleasure meeting you! Renee Renee [name]     I discussed the assessment and treatment plan with the patient. The patient was provided an opportunity to ask questions and all were answered. The patient agreed with the plan and demonstrated an understanding of the instructions.   The patient was advised to call back or seek an in-person evaluation if the symptoms worsen or if the condition fails to improve as anticipated.    Renee Koch, NP    Review of Systems  Constitutional: Positive for chills, fatigue and fever.  HENT: Negative for congestion and sore throat.   Respiratory: Positive for cough and shortness of breath.   Cardiovascular: Negative for chest pain.       Past Medical History:  Diagnosis Date  . Acute medial meniscus tear of left knee 04/20/2016  . Breast cancer ([place])    2010 ; left side mastectomy chemo pill  . Depression   . DVT (deep venous thrombosis) ([facility])    left arm  . Family history of breast cancer in sister    sister with PALB2  mutation  . 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 Hypothyroidism   . 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 Osteopenia 09/01/2014  . PTSD (post-traumatic stress disorder) 08/15/2014  . Rupture of posterior  cruciate ligament of left knee 04/20/2016  . 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 Thyroid disease      Social History   Socioeconomic History  . Marital status: Divorced    Spouse name: Not on file  . Number of children: 2  . Years of education: Some College  . Highest education level: Not on file  Occupational History  . Not on file  Social Needs  . Financial resource strain: Not hard at all  . Food insecurity    Worry: Not on file    Inability: Not on file  . Transportation needs    Medical: Not on file    Non-medical: Not on file  Tobacco Use  . Smoking status: Former Smoker    Packs/day: 0.50    Years: 14.00    Pack years: 7.00    Quit date: 09/22/2010    Years since quitting: 8.5  . Smokeless tobacco: Never Used  Substance and Sexual Activity  . Alcohol use: Not Currently    Comment: rare use  . Drug use: No  . Sexual activity: Not Currently  Lifestyle  . Physical activity    Days per week: 7 days    Minutes per session: 30 min  . Stress: Not on file  Relationships  . Social Herbalist on phone: Not on file    Gets together: Not on file    Attends religious service: Not on file    Active member of club or organization: Not on file    Attends meetings of clubs or organizations: Not on file    Relationship status: Not on file  . Intimate partner violence    Fear of current or ex partner: Not on file    Emotionally abused: Not on file    Physically abused: Not on file    Forced sexual activity: Not on file  Other Topics Concern  . 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 BREAST CAPSULECTOMY WITH IMPLANT EXCHANGE Left 08/19/2011  . BREAST RECONSTRUCTION Left 01/15/2011  . MINOR IRRIGATION AND DEBRIDEMENT OF WOUND Left 03/05/2011   back and left breast  . MODIFIED RADICAL MASTECTOMY Left 10/08/2009  . PLACEMENT OF BREAST IMPLANTS Right 08/19/2011  . PORT-A-CATH REMOVAL  07/15/2010  . PORTACATH PLACEMENT  11/14/2009   with left breast wound revision    Family History  Problem Relation Age of Onset  . Hypertension Mother   . Heart disease Mother   . COPD Mother   . Other Mother        Cow valve  . Diabetes Father   . Stroke Father 14  . Hypertension Father   . Hypertension Maternal Grandmother   . Diabetes Paternal  Grandmother   . Lung cancer Paternal Grandfather        deceased 1s; smoker  . Breast cancer Sister 61       currently 93; PALB2 mutation/ deceased  . Hypertension Brother     No Known Allergies  Current Outpatient Medications on File Prior to Visit  Medication Sig Dispense Refill  . anastrozole (ARIMIDEX) 1 MG tablet TAKE 1 TABLET BY MOUTH EVERY DAY 30 tablet 2  . buPROPion (WELLBUTRIN XL) 150 MG 24 hr tablet TAKE 1 TABLET BY MOUTH EVERY DAY 90 tablet 0  . calcium-vitamin D (OSCAL WITH D) 500-200 MG-UNIT per tablet Take 1 tablet by mouth 2 (two) times daily. 60 tablet 11  . ciprofloxacin (CILOXAN) 0.3 % ophthalmic solution Place 1 drop into both eyes every 2 (two) hours while awake. Administer 1 drop, every 2 hours, while awake, for 2 days. Then 1 drop, every 4 hours, while awake, for the next 5 days. 5 mL 0  . denosumab (PROLIA) 60 MG/ML SOSY injection Inject 60 mg into the skin every 6 (six) months.    . levothyroxine (SYNTHROID, LEVOTHROID) 100 MCG tablet Take 1 tablet (100 mcg total) by mouth daily. 30 tablet 11  . [DISCONTINUED] buPROPion (WELLBUTRIN XL) 150 MG 24 hr tablet TAKE 1 TABLET (150 MG TOTAL) BY MOUTH DAILY. 30 tablet 0   No current facility-administered medications on file prior to visit.     Temp 100.3 F  (37.9 C) (Oral)   Wt 125 lb (56.7 kg)   BMI 24.01 kg/m    Objective:   Physical Exam  Constitutional: She is oriented to person, place, and time. She appears well-nourished. She appears ill.  Respiratory: Effort normal. No respiratory distress.  Slight dry cough during exam  Neurological: She is alert and oriented to person, place, and time.  Psychiatric: She has a normal mood and affect.           Assessment & Plan:

## 2019-03-28 NOTE — Assessment & Plan Note (Signed)
Symptoms present for the last 48 hours. Suspicious for Covid, especially given her occupation. Other differentials include URI, pneumonia. Lower suspicion for pneumonia at this point given lower grade fevers, presentation, non productive cough.  Start with retesting for Covid-19. Information provided. Discussed home care treatment, quarantine, note for work provided. Hospital precautions provided. If Covid test negative then consider xray to rule out pneumonia.

## 2019-03-29 LAB — NOVEL CORONAVIRUS, NAA: SARS-CoV-2, NAA: NOT DETECTED

## 2019-04-02 ENCOUNTER — Other Ambulatory Visit (HOSPITAL_COMMUNITY): Payer: Self-pay | Admitting: *Deleted

## 2019-04-02 DIAGNOSIS — C50912 Malignant neoplasm of unspecified site of left female breast: Secondary | ICD-10-CM

## 2019-04-02 DIAGNOSIS — K769 Liver disease, unspecified: Secondary | ICD-10-CM

## 2019-04-03 ENCOUNTER — Other Ambulatory Visit (HOSPITAL_COMMUNITY): Payer: No Typology Code available for payment source

## 2019-04-03 ENCOUNTER — Ambulatory Visit (HOSPITAL_COMMUNITY): Payer: No Typology Code available for payment source

## 2019-04-05 ENCOUNTER — Other Ambulatory Visit: Payer: Self-pay | Admitting: Family Medicine

## 2019-04-05 NOTE — Telephone Encounter (Signed)
LOV 03/28/2019 with Anda Kraft for acute visit. Last filled by Dr Einar Pheasant on 01/05/2019 for # 30 with 2 refills.

## 2019-04-05 NOTE — Telephone Encounter (Signed)
I've never prescribed this, will you please send to a MD?

## 2019-04-11 ENCOUNTER — Encounter (HOSPITAL_COMMUNITY): Payer: Self-pay | Admitting: *Deleted

## 2019-04-11 ENCOUNTER — Inpatient Hospital Stay (HOSPITAL_COMMUNITY): Payer: No Typology Code available for payment source | Attending: Hematology

## 2019-04-11 ENCOUNTER — Encounter (HOSPITAL_COMMUNITY): Payer: Self-pay

## 2019-04-11 ENCOUNTER — Other Ambulatory Visit: Payer: Self-pay

## 2019-04-11 ENCOUNTER — Inpatient Hospital Stay (HOSPITAL_COMMUNITY): Payer: No Typology Code available for payment source

## 2019-04-11 VITALS — BP 124/77 | HR 71 | Temp 97.3°F | Resp 18

## 2019-04-11 DIAGNOSIS — Z170 Estrogen receptor positive status [ER+]: Secondary | ICD-10-CM | POA: Insufficient documentation

## 2019-04-11 DIAGNOSIS — C50912 Malignant neoplasm of unspecified site of left female breast: Secondary | ICD-10-CM

## 2019-04-11 DIAGNOSIS — M85852 Other specified disorders of bone density and structure, left thigh: Secondary | ICD-10-CM

## 2019-04-11 DIAGNOSIS — M8580 Other specified disorders of bone density and structure, unspecified site: Secondary | ICD-10-CM | POA: Insufficient documentation

## 2019-04-11 DIAGNOSIS — K769 Liver disease, unspecified: Secondary | ICD-10-CM

## 2019-04-11 LAB — COMPREHENSIVE METABOLIC PANEL
ALT: 12 U/L (ref 0–44)
AST: 20 U/L (ref 15–41)
Albumin: 3.7 g/dL (ref 3.5–5.0)
Alkaline Phosphatase: 67 U/L (ref 38–126)
Anion gap: 10 (ref 5–15)
BUN: 19 mg/dL (ref 6–20)
CO2: 25 mmol/L (ref 22–32)
Calcium: 9.4 mg/dL (ref 8.9–10.3)
Chloride: 102 mmol/L (ref 98–111)
Creatinine, Ser: 0.83 mg/dL (ref 0.44–1.00)
GFR calc Af Amer: >60 mL/min (ref >60)
GFR calc non Af Amer: >60 mL/min (ref >60)
Glucose, Bld: 106 mg/dL — ABNORMAL HIGH (ref 70–99)
Potassium: 3.8 mmol/L (ref 3.5–5.1)
Sodium: 137 mmol/L (ref 135–145)
Total Bilirubin: 0.2 mg/dL — ABNORMAL LOW (ref 0.3–1.2)
Total Protein: 7.2 g/dL (ref 6.5–8.1)

## 2019-04-11 MED ORDER — DENOSUMAB 60 MG/ML ~~LOC~~ SOSY
60.0000 mg | PREFILLED_SYRINGE | Freq: Once | SUBCUTANEOUS | Status: AC
  Administered 2019-04-11: 60 mg via SUBCUTANEOUS
  Filled 2019-04-11: qty 1

## 2019-04-11 NOTE — Progress Notes (Signed)
Taking calcium as directed.  Denied tooth, jaw, and leg pain.  Patient tolerated injection with no complaints voiced.  Site clean and dry with no bruising or swelling noted at site.  Band aid applied.  Vss with discharge and left ambulatory with no s/s of distress noted.

## 2019-06-18 ENCOUNTER — Ambulatory Visit (HOSPITAL_COMMUNITY): Payer: No Typology Code available for payment source

## 2019-06-25 ENCOUNTER — Ambulatory Visit (HOSPITAL_COMMUNITY): Payer: No Typology Code available for payment source | Admitting: Nurse Practitioner

## 2019-06-26 ENCOUNTER — Ambulatory Visit (HOSPITAL_COMMUNITY): Payer: No Typology Code available for payment source | Admitting: Nurse Practitioner

## 2019-07-09 ENCOUNTER — Ambulatory Visit (HOSPITAL_COMMUNITY)
Admission: RE | Admit: 2019-07-09 | Discharge: 2019-07-09 | Disposition: A | Discharge status: Home / Self Care | Payer: PRIVATE HEALTH INSURANCE | Source: Ambulatory Visit | Attending: Internal Medicine | Admitting: Internal Medicine

## 2019-07-09 ENCOUNTER — Other Ambulatory Visit: Payer: Self-pay

## 2019-07-09 DIAGNOSIS — Z1231 Encounter for screening mammogram for malignant neoplasm of breast: Secondary | ICD-10-CM | POA: Insufficient documentation

## 2019-07-09 DIAGNOSIS — C50912 Malignant neoplasm of unspecified site of left female breast: Secondary | ICD-10-CM | POA: Diagnosis present

## 2019-07-12 ENCOUNTER — Ambulatory Visit (HOSPITAL_COMMUNITY): Payer: No Typology Code available for payment source | Admitting: Nurse Practitioner

## 2019-07-14 ENCOUNTER — Other Ambulatory Visit: Payer: Self-pay | Admitting: Family Medicine

## 2019-07-16 NOTE — Telephone Encounter (Signed)
Left detailed message on patient's cell phone voicemail-ok per DPR on file.

## 2019-07-16 NOTE — Telephone Encounter (Signed)
This medication is prescribed by Oncology and she is still in the follow-up period.   It also looks like she may have missed a follow-up with oncology  Would recommend that she reach out to her Oncologists for refills. Per their note she should continue until Nov 2021 but would prefer that she continue refills with them.

## 2019-07-16 NOTE — Telephone Encounter (Signed)
Last office visit was on 03/28/2019 for cough. No future appointments made

## 2019-07-19 ENCOUNTER — Other Ambulatory Visit: Payer: Self-pay | Admitting: Family Medicine

## 2019-07-19 ENCOUNTER — Other Ambulatory Visit (HOSPITAL_COMMUNITY): Payer: Self-pay | Admitting: *Deleted

## 2019-07-19 DIAGNOSIS — C50912 Malignant neoplasm of unspecified site of left female breast: Secondary | ICD-10-CM

## 2019-07-19 MED ORDER — ANASTROZOLE 1 MG PO TABS: 1.0000 mg | ORAL_TABLET | Freq: Every day | ORAL | 2 refills | Status: DC

## 2019-08-12 ENCOUNTER — Other Ambulatory Visit: Payer: Self-pay | Admitting: Primary Care

## 2019-08-12 DIAGNOSIS — F331 Major depressive disorder, recurrent, moderate: Secondary | ICD-10-CM

## 2019-08-15 ENCOUNTER — Telehealth: Payer: Self-pay | Admitting: Family Medicine

## 2019-08-15 DIAGNOSIS — E890 Postprocedural hypothyroidism: Secondary | ICD-10-CM

## 2019-08-15 NOTE — Telephone Encounter (Signed)
Patient called back and scheduled follow up visit

## 2019-08-15 NOTE — Telephone Encounter (Signed)
Left message for patient to call back, needs to schedule follow up visit for more refills.

## 2019-08-22 ENCOUNTER — Other Ambulatory Visit: Payer: Self-pay

## 2019-08-22 ENCOUNTER — Encounter: Payer: Self-pay | Admitting: Family Medicine

## 2019-08-22 ENCOUNTER — Ambulatory Visit (INDEPENDENT_AMBULATORY_CARE_PROVIDER_SITE_OTHER): Payer: No Typology Code available for payment source | Admitting: Family Medicine

## 2019-08-22 VITALS — BP 134/88 | HR 72 | Temp 97.2°F | Ht 60.5 in | Wt 128.5 lb

## 2019-08-22 DIAGNOSIS — Z7189 Other specified counseling: Secondary | ICD-10-CM

## 2019-08-22 DIAGNOSIS — E039 Hypothyroidism, unspecified: Secondary | ICD-10-CM | POA: Diagnosis not present

## 2019-08-22 DIAGNOSIS — E890 Postprocedural hypothyroidism: Secondary | ICD-10-CM | POA: Diagnosis not present

## 2019-08-22 DIAGNOSIS — I700 Atherosclerosis of aorta: Secondary | ICD-10-CM

## 2019-08-22 DIAGNOSIS — Z7185 Encounter for immunization safety counseling: Secondary | ICD-10-CM

## 2019-08-22 LAB — LIPID PANEL
Cholesterol: 241 mg/dL — ABNORMAL HIGH (ref 0–200)
HDL: 70.9 mg/dL (ref >39.00)
LDL Cholesterol: 152 mg/dL — ABNORMAL HIGH (ref 0–99)
NonHDL: 170.21
Total CHOL/HDL Ratio: 3
Triglycerides: 90 mg/dL (ref 0.0–149.0)
VLDL: 18 mg/dL (ref 0.0–40.0)

## 2019-08-22 LAB — TSH: TSH: 1.66 u[IU]/mL (ref 0.35–4.50)

## 2019-08-22 NOTE — Patient Instructions (Signed)
Atherosclerosis of the aorta  - Statin (Atorvastatin) -- Side effects - Leg cramps and liver changes (rare)  - Baby Aspirin (81 mg) -- Side effects - bleeding  Recommended to decrease your risk of heart attack and stroke

## 2019-08-22 NOTE — Assessment & Plan Note (Signed)
Repeat TSH today. Continue levothyroxine based on level.

## 2019-08-22 NOTE — Assessment & Plan Note (Signed)
Discussed and encouraged statin and asa to reduce risk. Will check lipids today. Pt will consider based on lipid results.

## 2019-08-22 NOTE — Progress Notes (Signed)
   Subjective:     Renee [name] is a 49 y.o. female presenting for Follow-up     HPI   #hypothyroidism - doing well - has noticed that things are going well  #Covid shot - wondering about the safety - works with covid patients at work - is able to get it next week, hopefully    Review of Systems   Social History   Tobacco Use  Smoking Status Former Smoker  . Packs/day: 0.50  . Years: 14.00  . Pack years: 7.00  . Quit date: 09/22/2010  . Years since quitting: 8.9  Smokeless Tobacco Never Used        Objective:    BP Readings from Last 3 Encounters:  08/22/19 134/88  04/11/19 124/77  01/10/19 122/78   Wt Readings from Last 3 Encounters:  08/22/19 128 lb 8 oz (58.3 kg)  03/28/19 125 lb (56.7 kg)  01/10/19 125 lb (56.7 kg)    BP 134/88   Pulse 72   Temp (!) 97.2 F (36.2 C)   Ht 5' 0.5" (1.537 m)   Wt 128 lb 8 oz (58.3 kg)   SpO2 97%   BMI 24.68 kg/m    Physical Exam Constitutional:      General: She is not in acute distress.    Appearance: She is well-developed. She is not diaphoretic.  HENT:     Right Ear: External ear normal.     Left Ear: External ear normal.     Nose: Nose normal.  Eyes:     Conjunctiva/sclera: Conjunctivae normal.  Cardiovascular:     Rate and Rhythm: Normal rate.  Pulmonary:     Effort: Pulmonary effort is normal.  Musculoskeletal:     Cervical back: Neck supple.  Skin:    General: Skin is warm and dry.     Capillary Refill: Capillary refill takes less than 2 seconds.  Neurological:     Mental Status: She is alert. Mental status is at baseline.  Psychiatric:        Mood and Affect: Mood normal.        Behavior: Behavior normal.           Assessment & Plan:   Problem List Items Addressed This Visit      Cardiovascular and Mediastinum   Atherosclerosis of aorta ([place])    Discussed and encouraged statin and asa to reduce risk. Will check lipids today. Pt will consider based on lipid results.       Relevant Orders   Lipid Profile     Endocrine   Hypothyroidism - Primary    Repeat TSH today. Continue levothyroxine based on level.       Relevant Orders   TSH    Other Visit Diagnoses    Vaccine counseling         Answer questionsa bout the covid vaccine. Encouraged getting. Pt will plan to get through work next week  Return in about 1 year (around 08/21/2020).  Lesleigh Noe, MD

## 2019-08-23 ENCOUNTER — Encounter: Payer: Self-pay | Admitting: Family Medicine

## 2019-08-23 MED ORDER — LEVOTHYROXINE SODIUM 100 MCG PO TABS: 100.0000 ug | ORAL_TABLET | Freq: Every day | ORAL | 3 refills | Status: DC

## 2019-08-23 NOTE — Addendum Note (Signed)
Addended by: Lesleigh Noe on: 08/23/2019 08:08 AM   Modules accepted: Orders

## 2019-09-20 ENCOUNTER — Emergency Department (HOSPITAL_COMMUNITY)
Admission: EM | Admit: 2019-09-20 | Discharge: 2019-09-20 | Disposition: A | Discharge status: Home / Self Care | Payer: PRIVATE HEALTH INSURANCE | Attending: Emergency Medicine | Admitting: Emergency Medicine

## 2019-09-20 ENCOUNTER — Emergency Department (HOSPITAL_COMMUNITY): Payer: PRIVATE HEALTH INSURANCE

## 2019-09-20 ENCOUNTER — Telehealth: Payer: Self-pay

## 2019-09-20 ENCOUNTER — Other Ambulatory Visit: Payer: Self-pay

## 2019-09-20 ENCOUNTER — Encounter (HOSPITAL_COMMUNITY): Payer: Self-pay | Admitting: *Deleted

## 2019-09-20 DIAGNOSIS — R0602 Shortness of breath: Secondary | ICD-10-CM

## 2019-09-20 DIAGNOSIS — Z8616 Personal history of COVID-19: Secondary | ICD-10-CM | POA: Insufficient documentation

## 2019-09-20 DIAGNOSIS — Z7982 Long term (current) use of aspirin: Secondary | ICD-10-CM | POA: Insufficient documentation

## 2019-09-20 DIAGNOSIS — Z79899 Other long term (current) drug therapy: Secondary | ICD-10-CM | POA: Diagnosis not present

## 2019-09-20 DIAGNOSIS — Z853 Personal history of malignant neoplasm of breast: Secondary | ICD-10-CM | POA: Insufficient documentation

## 2019-09-20 DIAGNOSIS — E039 Hypothyroidism, unspecified: Secondary | ICD-10-CM | POA: Insufficient documentation

## 2019-09-20 NOTE — Telephone Encounter (Signed)
Per chart review tab pt is at [facility] ED. FYI to Dr Einar Pheasant.

## 2019-09-20 NOTE — ED Provider Notes (Signed)
[facility] EMERGENCY DEPARTMENT Provider Note   CSN: [id number] Arrival date & time: 09/20/19  0913     History Chief Complaint  Patient presents with  . Shortness of Breath    Renee [name] is a 49 y.o. female.  Pt presents to the ED today with sob.  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  She was released from quarantine today and went back to work.  She said she was restocking some things and became sob.  She feels fine now with sitting.  She denies any f/c.  No cp.  No leg swelling.        Past Medical History:  Diagnosis Date  . Acute medial meniscus tear of left knee 04/20/2016  . Breast cancer ([place])    2010 ; left side mastectomy chemo pill  . Depression   . DVT (deep venous thrombosis) (H[facility])    left arm  . Family history of breast cancer in sister    sister with PALB2 mutation  . 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 Hypothyroidism   . 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 Osteopenia 09/01/2014  . PTSD (post-traumatic stress disorder) 08/15/2014  . Rupture of posterior cruciate ligament of left knee 04/20/2016  . 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 Thyroid disease     Patient Active Problem List   Diagnosis Date Noted  . Cough 03/28/2019  . Atherosclerosis of aorta ([place]) 07/17/2018  . Genetic testing   . Monoallelic mutation of PALB2 gene   . Family history of breast cancer in sister   . Rupture of posterior cruciate ligament of left knee 04/20/2016  . Tear of lateral collateral ligament of left knee 04/20/2016  . Acute medial meniscus tear of left knee 04/20/2016  . Macrocytosis without anemia 09/27/2015  . ADD (attention deficit disorder) 11/07/2014  . Osteopenia 09/01/2014  . Severe major depression ([place]) 08/15/2014  . PTSD (post-traumatic stress disorder) 08/15/2014  . 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 Thyroid disease   . Hypothyroidism 02/24/2009  . [place] DISEASE 04/26/2008  . HYPERLIPIDEMIA 04/01/2008  . Depression 04/01/2008  . TREMOR 04/01/2008  . 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 BREAST CAPSULECTOMY WITH IMPLANT EXCHANGE Left 08/19/2011  . BREAST RECONSTRUCTION Left 01/15/2011  . MINOR IRRIGATION AND DEBRIDEMENT OF WOUND Left 03/05/2011   back and left breast  . MODIFIED RADICAL MASTECTOMY Left 10/08/2009  . PLACEMENT OF BREAST IMPLANTS Right 08/19/2011  . PORT-A-CATH REMOVAL  07/15/2010  . PORTACATH PLACEMENT  11/14/2009   with left breast wound revision     OB History   No obstetric history on file.     Family History  Problem Relation Age of Onset  . Hypertension Mother   . Heart disease Mother   . COPD Mother   . Other Mother        Cow valve  . Diabetes Father   . Stroke Father 73  . Hypertension Father   . Hypertension Maternal Grandmother   . Diabetes Paternal Grandmother   . Lung cancer Paternal Grandfather        deceased 43s; smoker  . Breast cancer Sister 84       currently 37; PALB2 mutation/ deceased  . Hypertension Brother  Social History   Tobacco Use  . Smoking status: Former Smoker    Packs/day: 0.50    Years: 14.00    Pack years: 7.00    Quit date: 09/22/2010    Years since quitting: 9.0  . Smokeless tobacco: Never Used  Substance Use Topics  . Alcohol use: Yes    Comment: rare use  . Drug use: No    Home Medications Prior to Admission medications   Medication Sig Start Date End Date Taking? Authorizing Provider  anastrozole (ARIMIDEX) 1 MG tablet Take 1 tablet (1 mg total) by mouth daily. 07/19/19   Derek Jack, MD  buPROPion (WELLBUTRIN XL) 150 MG 24 hr tablet Take 1 tablet (150 mg total) by mouth daily. Please schedule appointment for further refills. 08/14/19   Lesleigh Noe, MD  calcium-vitamin D (OSCAL WITH D)  500-200 MG-UNIT per tablet Take 1 tablet by mouth 2 (two) times daily. 09/04/14   Baird Cancer, PA-C  denosumab (PROLIA) 60 MG/ML SOSY injection Inject 60 mg into the skin every 6 (six) months.    [provider]  levothyroxine (SYNTHROID) 100 MCG tablet Take 1 tablet (100 mcg total) by mouth daily. Please schedule an appointment for follow up 08/23/19   Lesleigh Noe, MD  buPROPion (WELLBUTRIN XL) 150 MG 24 hr tablet TAKE 1 TABLET (150 MG TOTAL) BY MOUTH DAILY. 01/24/19   Lesleigh Noe, MD    Allergies    Patient has no known allergies.  Review of Systems   Review of Systems  Respiratory: Positive for shortness of breath.   All other systems reviewed and are negative.   Physical Exam Updated Vital Signs BP 119/82 (BP Location: Right Arm)   Pulse 70   Temp 97.8 F (36.6 C) (Oral)   Resp 18   Ht 5' (1.524 m)   Wt 59 kg   SpO2 100%   BMI 25.39 kg/m   Physical Exam Vitals and nursing note reviewed.  Constitutional:      Appearance: She is well-developed.  HENT:     Head: Normocephalic and atraumatic.     Mouth/Throat:     Mouth: Mucous membranes are moist.     Pharynx: Oropharynx is clear.  Eyes:     Extraocular Movements: Extraocular movements intact.     Pupils: Pupils are equal, round, and reactive to light.  Cardiovascular:     Rate and Rhythm: Normal rate and regular rhythm.  Pulmonary:     Effort: Pulmonary effort is normal.     Breath sounds: Normal breath sounds.  Abdominal:     General: Bowel sounds are normal.     Palpations: Abdomen is soft.  Musculoskeletal:        General: Normal range of motion.     Cervical back: Normal range of motion and neck supple.  Skin:    General: Skin is warm.     Capillary Refill: Capillary refill takes less than 2 seconds.  Neurological:     General: No focal deficit present.     Mental Status: She is alert and oriented to person, place, and time.  Psychiatric:        Mood and Affect: Mood normal.         Behavior: Behavior normal.     ED Results / Procedures / Treatments   Labs (all labs ordered are listed, but only abnormal results are displayed) Labs Reviewed - No data to display  EKG EKG Interpretation  Date/Time:  Thursday September 20 2019 09:23:44 EST Ventricular Rate:  69 PR Interval:    QRS Duration: 92 QT Interval:  407 QTC Calculation: 436 R Axis:   87 Text Interpretation: Sinus rhythm Probable anteroseptal infarct, old No significant change since last tracing Confirmed by Isla Pence [phone]) on 09/20/2019 9:29:12 AM   Radiology DG Chest Portable 1 View  Result Date: 09/20/2019 CLINICAL DATA:  Shortness of breath, history of COVID exposure 2 weeks ago EXAM: PORTABLE CHEST 1 VIEW COMPARISON:  04/10/2014 FINDINGS: Cardiac shadow is within normal limits. The lungs are well aerated bilaterally. No focal infiltrate or sizable effusion is seen. No bony abnormality is noted. IMPRESSION: No active disease. Electronically Signed   By: Inez Catalina M.D.   On: 09/20/2019 10:19    Procedures Procedures (including critical care time)  Medications Ordered in ED Medications - No data to display  ED Course  I have reviewed the triage vital signs and the nursing notes.  Pertinent labs & imaging results that were available during my care of the patient were reviewed by me and considered in my medical decision making (see chart for details).    MDM Rules/Calculators/A&P                      I have a low suspicion for PE.  She is not sob now.  No cp.  HR is nl.  She looks nontoxic.  Pt said she became overwhelmed when she saw how much work she had to do.  Pt likely tried to do too much too quickly.  She is stable for d/c.  Return if worse.  Final Clinical Impression(s) / ED Diagnoses Final diagnoses:  Shortness of breath    Rx / DC Orders ED Discharge Orders    None       Isla Pence, MD 09/20/19 1029

## 2019-09-20 NOTE — Telephone Encounter (Signed)
[facility] - Client TELEPHONE ADVICE RECORD [facility] Patient Name: Renee [name] Gender: Female DOB: 01/28/1971 Age: 49 Y 11 M 19 D Return Phone Number: [phone] (Primary) Address: City/State/Zip: [place] [address] Client [facility] - Client Client Site [facility] - Day Physician Waunita Schooner- MD Contact Type Call Who Is Calling Patient / Member / Family / Caregiver Call Type Triage / Clinical Relationship To Patient Self Return Phone Number [phone] (Primary) Chief Complaint BREATHING - shortness of breath or sounds breathless Reason for Call Symptomatic / Request for [place] is covid positive. She has shortness of breath [facility] ER Translation No Nurse Assessment Nurse: Thad Ranger, RN, Langley Gauss Date/Time (Eastern Time): 09/20/2019 8:49:21 AM Confirm and document reason for call. If symptomatic, describe symptoms. ---Caller is covid positive. She has shortness of breath. Has the patient had close contact with a person known or suspected to have the novel coronavirus illness OR traveled / lives in area with major community spread (including international travel) in the last 14 days from the onset of symptoms? * If Asymptomatic, screen for exposure and travel within the last 14 days. ---Yes Does the patient have any new or worsening symptoms? ---Yes Will a triage be completed? ---Yes Related visit to physician within the last 2 weeks? ---No Does the PT have any chronic conditions? (i.e. diabetes, asthma, this includes High risk factors for pregnancy, etc.) ---No Is the patient pregnant or possibly pregnant? (Ask all females between the ages of 62-55) ---No Is this a behavioral health or substance abuse call? ---No Guidelines Guideline Title Affirmed Question Affirmed Notes Nurse Date/Time  (Eastern Time) Coronavirus (COVID-19) - Diagnosed or Suspected SEVERE difficulty breathing (e.g., struggling for each breath, speaks in single words) Carmon, RN, Langley Gauss 09/20/2019 8:50:17 AM PLEASE NOTE: All timestamps contained within this report are represented as [place] Standard Time. CONFIDENTIALTY NOTICE: This fax transmission is intended only for the addressee. It contains information that is legally privileged, confidential or otherwise protected from use or disclosure. If you are not the intended recipient, you are strictly prohibited from reviewing, disclosing, copying using or disseminating any of this information or taking any action in reliance on or regarding this information. If you have received this fax in error, please notify us immediately by telephone so that we can arrange for its return to [place]. Phone: [phone], Toll-Free: [phone], Fax: [phone] Page: 2 of 2 Call Id: [id number] [place]. Time Eilene Ghazi Time) Disposition Final User 09/20/2019 8:44:01 AM Send to Urgent Queue Jaclyn Prime 09/20/2019 8:52:53 AM 911 Outcome Documentation Carmon, RN, Langley Gauss Reason: 911 cb not complete due to pt refused to call 911 and will have dtr take her to the ER 09/20/2019 8:52:06 AM Call EMS 911 Now Yes Carmon, RN, Yevette Edwards Disagree/Comply Disagree Caller Understands Yes PreDisposition Call Doctor Care Advice Given Per Guideline CALL EMS 911 NOW: CARE ADVICE given per CORONAVIRUS (COVID-19) - DIAGNOSED OR SUSPECTED (Adult) guideline. Referrals GO TO FACILITY OTHER - SPECIFY

## 2019-09-20 NOTE — ED Triage Notes (Signed)
Patient presents to ed c/o increased sob today. States she was sx. With covid 2/1 today was her first day back at work and states she got really stressed out about all the work she needed to do. Started coughing with moving around , however when she was at home states she didn't have sob.

## 2019-09-20 NOTE — ED Notes (Signed)
Pt ambulated in room with no assistance needed. Steady gait when  ambulating as well. Pts O2 stayed at 100% on RA. Pt stated that she felt SOB and slight dizziness.

## 2019-10-08 ENCOUNTER — Other Ambulatory Visit (HOSPITAL_COMMUNITY): Payer: Self-pay | Admitting: *Deleted

## 2019-10-08 DIAGNOSIS — C50912 Malignant neoplasm of unspecified site of left female breast: Secondary | ICD-10-CM

## 2019-10-08 DIAGNOSIS — M85852 Other specified disorders of bone density and structure, left thigh: Secondary | ICD-10-CM

## 2019-10-09 ENCOUNTER — Ambulatory Visit (HOSPITAL_COMMUNITY): Payer: No Typology Code available for payment source

## 2019-10-09 ENCOUNTER — Inpatient Hospital Stay (HOSPITAL_COMMUNITY): Payer: PRIVATE HEALTH INSURANCE | Attending: Hematology

## 2019-10-10 ENCOUNTER — Ambulatory Visit (INDEPENDENT_AMBULATORY_CARE_PROVIDER_SITE_OTHER): Payer: PRIVATE HEALTH INSURANCE

## 2019-10-10 ENCOUNTER — Ambulatory Visit
Admission: EM | Admit: 2019-10-10 | Discharge: 2019-10-10 | Disposition: A | Discharge status: Home / Self Care | Payer: PRIVATE HEALTH INSURANCE | Attending: Emergency Medicine | Admitting: Emergency Medicine

## 2019-10-10 ENCOUNTER — Telehealth: Payer: Self-pay | Admitting: *Deleted

## 2019-10-10 DIAGNOSIS — R05 Cough: Secondary | ICD-10-CM

## 2019-10-10 DIAGNOSIS — Z8616 Personal history of COVID-19: Secondary | ICD-10-CM | POA: Diagnosis not present

## 2019-10-10 DIAGNOSIS — R059 Cough, unspecified: Secondary | ICD-10-CM

## 2019-10-10 MED ORDER — BENZONATATE 100 MG PO CAPS: 100.0000 mg | ORAL_CAPSULE | Freq: Three times a day (TID) | ORAL | 0 refills | Status: DC

## 2019-10-10 MED ORDER — PREDNISONE 20 MG PO TABS: 20.0000 mg | ORAL_TABLET | Freq: Every day | ORAL | 0 refills | Status: DC

## 2019-10-10 NOTE — ED Provider Notes (Signed)
[facility]    CSN: [id number] Arrival date & time: 10/10/19  1311      History   Chief Complaint Chief Complaint  Patient presents with  . Cough    HPI Renee [name] is a 49 y.o. female with history of thyroid disease, breast cancer, DVT presenting for cough since Sunday.  Patient diagnosed with Covid 09/10/19: Results are not accessible at time of visit.  Patient also endorsing costal chest pain that is worse with coughing.  Patient denies hemoptysis, upper/lower extremity swelling/tenderness, persistent chest pain, palpitations, fever.  No known sick contacts, abdominal pain or diarrhea.  Patient does admit to episode of emesis without bile or blood last night: States that she had a migraine that was per her normal.  Denying headache or nausea today.  Reports good oral intake: No difficulty breathing or swallowing.  Some symptom onset, patient has been in contact with her PCPs office: Recommended to come in for in person evaluation and chest x-ray.  Past Medical History:  Diagnosis Date  . Acute medial meniscus tear of left knee 04/20/2016  . Breast cancer ([place])    2010 ; left side mastectomy chemo pill  . Depression   . DVT (deep venous thrombosis) ([facility])    left arm  . Family history of breast cancer in sister    sister with PALB2 mutation  . 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 Hypothyroidism   . 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 Osteopenia 09/01/2014  . PTSD (post-traumatic stress disorder) 08/15/2014  . Rupture of posterior cruciate ligament of left knee 04/20/2016  . 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 Thyroid disease     Patient Active Problem List   Diagnosis Date Noted  . Cough 03/28/2019  . Atherosclerosis of aorta ([place]) 07/17/2018  . Genetic testing   . Monoallelic mutation of PALB2 gene   . Family history of breast  cancer in sister   . Rupture of posterior cruciate ligament of left knee 04/20/2016  . Tear of lateral collateral ligament of left knee 04/20/2016  . Acute medial meniscus tear of left knee 04/20/2016  . Macrocytosis without anemia 09/27/2015  . ADD (attention deficit disorder) 11/07/2014  . Osteopenia 09/01/2014  . 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 PTSD (post-traumatic stress disorder) 08/15/2014  . 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 Thyroid disease   . Hypothyroidism 02/24/2009  . [place] DISEASE 04/26/2008  . HYPERLIPIDEMIA 04/01/2008  . Depression 04/01/2008  . TREMOR 04/01/2008  . 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 BREAST CAPSULECTOMY WITH IMPLANT EXCHANGE Left 08/19/2011  . BREAST RECONSTRUCTION Left 01/15/2011  . MINOR IRRIGATION AND DEBRIDEMENT OF WOUND Left 03/05/2011   back and left breast  . MODIFIED RADICAL MASTECTOMY Left 10/08/2009  . PLACEMENT OF BREAST IMPLANTS Right 08/19/2011  . PORT-A-CATH REMOVAL  07/15/2010  . PORTACATH PLACEMENT  11/14/2009   with left breast wound revision    OB History   No obstetric history on file.      Home Medications    Prior to Admission medications   Medication Sig Start Date End Date Taking? Authorizing Provider  anastrozole (ARIMIDEX) 1 MG tablet Take 1 tablet (1 mg total) by mouth daily. 07/19/19   Derek Jack, MD  benzonatate (TESSALON) 100 MG capsule Take 1  capsule (100 mg total) by mouth every 8 (eight) hours. 10/10/19   Hall-Potvin, [place], PA-C  buPROPion (WELLBUTRIN XL) 150 MG 24 hr tablet Take 1 tablet (150 mg total) by mouth daily. Please schedule appointment for further refills. 08/14/19   Lesleigh Noe, MD  calcium-vitamin D (OSCAL WITH D) 500-200 MG-UNIT per tablet Take 1 tablet by mouth 2 (two) times daily. 09/04/14   Baird Cancer, PA-C  denosumab (PROLIA) 60 MG/ML SOSY injection Inject 60 mg into the skin every 6  (six) months.    [provider]  levothyroxine (SYNTHROID) 100 MCG tablet Take 1 tablet (100 mcg total) by mouth daily. Please schedule an appointment for follow up 08/23/19   Lesleigh Noe, MD  predniSONE (DELTASONE) 20 MG tablet Take 1 tablet (20 mg total) by mouth daily. 10/10/19   Hall-Potvin, [place], PA-C  buPROPion (WELLBUTRIN XL) 150 MG 24 hr tablet TAKE 1 TABLET (150 MG TOTAL) BY MOUTH DAILY. 01/24/19   Lesleigh Noe, MD    Family History Family History  Problem Relation Age of Onset  . Hypertension Mother   . Heart disease Mother   . COPD Mother   . Other Mother        Cow valve  . Diabetes Father   . Stroke Father 81  . Hypertension Father   . Hypertension Maternal Grandmother   . Diabetes Paternal Grandmother   . Lung cancer Paternal Grandfather        deceased 27s; smoker  . Breast cancer Sister 94       currently 58; PALB2 mutation/ deceased  . Hypertension Brother     Social History Social History   Tobacco Use  . Smoking status: Former Smoker    Packs/day: 0.50    Years: 14.00    Pack years: 7.00    Quit date: 09/22/2010    Years since quitting: 9.0  . Smokeless tobacco: Never Used  Substance Use Topics  . Alcohol use: Yes    Comment: rare use  . Drug use: No     Allergies   Patient has no known allergies.   Review of Systems As per HPI   Physical Exam Triage Vital Signs ED Triage Vitals  Enc Vitals Group     BP 10/10/19 1315 (!) 144/90     Pulse Rate 10/10/19 1315 88     Resp 10/10/19 1315 18     Temp 10/10/19 1315 98 F (36.7 C)     Temp Source 10/10/19 1315 Oral     SpO2 10/10/19 1315 98 %     Weight --      Height --      Head Circumference --      Peak Flow --      Pain Score 10/10/19 1321 2     Pain Loc --      Pain Edu? --      Excl. in [place]? --    No data found.  Updated Vital Signs BP (!) 144/90 (BP Location: Left Arm)   Pulse 88   Temp 98 F (36.7 C) (Oral)   Resp 18   SpO2 98%   Visual Acuity Right  Eye Distance:   Left Eye Distance:   Bilateral Distance:    Right Eye Near:   Left Eye Near:    Bilateral Near:     Physical Exam Constitutional:      General: She is not in acute distress.    Appearance: She is obese. She  is not ill-appearing or diaphoretic.  HENT:     Head: Normocephalic and atraumatic.     Right Ear: Tympanic membrane, ear canal and external ear normal.     Left Ear: Tympanic membrane, ear canal and external ear normal.     Nose: Nose normal.     Mouth/Throat:     Mouth: Mucous membranes are moist.     Pharynx: Oropharynx is clear. No oropharyngeal exudate or posterior oropharyngeal erythema.  Eyes:     General: No scleral icterus.       Right eye: No discharge.        Left eye: No discharge.     Extraocular Movements: Extraocular movements intact.     Conjunctiva/sclera: Conjunctivae normal.     Pupils: Pupils are equal, round, and reactive to light.  Neck:     Comments: Trachea midline, negative JVD Cardiovascular:     Rate and Rhythm: Normal rate and regular rhythm.     Heart sounds: No murmur. No gallop.   Pulmonary:     Effort: Pulmonary effort is normal. No respiratory distress.     Breath sounds: No wheezing, rhonchi or rales.     Comments: Diffuse chest tenderness, worse at costal margins without crepitus, edema Chest:     Chest wall: Tenderness present.  Musculoskeletal:        General: No swelling. Normal range of motion.     Cervical back: Neck supple. No tenderness.     Right lower leg: No edema.     Left lower leg: No edema.  Lymphadenopathy:     Cervical: No cervical adenopathy.  Skin:    Capillary Refill: Capillary refill takes less than 2 seconds.     Coloration: Skin is not jaundiced or pale.     Findings: No rash.  Neurological:     General: No focal deficit present.     Mental Status: She is alert and oriented to person, place, and time.      UC Treatments / Results  Labs (all labs ordered are listed, but only abnormal  results are displayed) Labs Reviewed - No data to display  EKG   Radiology DG Chest 2 View  Result Date: 10/10/2019 CLINICAL DATA:  Cough. EXAM: CHEST - 2 VIEW COMPARISON:  September 20, 2019. FINDINGS: The heart size and mediastinal contours are within normal limits. Both lungs are clear. The visualized skeletal structures are unremarkable. IMPRESSION: No active cardiopulmonary disease. Electronically Signed   By: Marijo Conception M.D.   On: 10/10/2019 13:59    Procedures Procedures (including critical care time)  Medications Ordered in UC Medications - No data to display  Initial Impression / Assessment and Plan / UC Course  I have reviewed the triage vital signs and the nursing notes.  Pertinent labs & imaging results that were available during my care of the patient were reviewed by me and considered in my medical decision making (see chart for details).     Patient afebrile, nontoxic, with SpO2 98%.  Chest x-ray done office, reviewed by me and radiology compared to previous from 09/20/2019: Lungs are clear of infiltrate, effusion, pneumothorax.  Negative for active cardiopulmonary disease.  Reviewed findings with patient verbalized understanding.  Will treat supportively as outlined below.  Patient to follow-up with PCP for further evaluation/management if needed.  Return precautions discussed, patient verbalized understanding and is agreeable to plan. Final Clinical Impressions(s) / UC Diagnoses   Final diagnoses:  Cough     Discharge Instructions  Tessalon & prednisone for cough.  Start flonase, atrovent nasal spray for nasal congestion/drainage. You can use over the counter nasal saline rinse such as neti pot for nasal congestion. Keep hydrated, your urine should be clear to pale yellow in color. Tylenol/motrin for fever and pain. Monitor for any worsening of symptoms, chest pain, shortness of breath, wheezing, swelling of the throat, go to the emergency department for  further evaluation needed.     ED Prescriptions    Medication Sig Dispense Auth. Provider   predniSONE (DELTASONE) 20 MG tablet Take 1 tablet (20 mg total) by mouth daily. 5 tablet Hall-Potvin, [place], PA-C   benzonatate (TESSALON) 100 MG capsule Take 1 capsule (100 mg total) by mouth every 8 (eight) hours. 21 capsule Hall-Potvin, [place], PA-C     PDMP not reviewed this encounter.   Hall-Potvin, [place], [place] 10/10/19 1458

## 2019-10-10 NOTE — Telephone Encounter (Signed)
Patient called stating that she tested positive for covid 09/10/19. Patient stated that she went back to work on 09/24/19. Patient stated that she had a real bad heahache on 10/09/19 and has been taking ibuprofen for it. Patient stated that she was vomiting this morning. Patient stated that she has a terrible cough and it is painful when she coughs. Patient stated that she is not feeling well at all now. Patient stated that she has had chills sweating and puffiness under her eyes. After speaking with Dr. Glori Bickers patient was advised that she does need to be seen and may need to have a chest x-ray done. Patient was given information on [facility] at [facility]. Patient stated that she will talk with her director at work and will plan on heading over to the Urgent Care as soon as she can leave work.

## 2019-10-10 NOTE — ED Triage Notes (Signed)
Pt c/o cough since Sunday. Had covid last month without a cough. Pain to sids and stomach from coughing. States vomiting x1 last night.

## 2019-10-10 NOTE — Discharge Instructions (Signed)
Tessalon & prednisone for cough.  Start flonase, atrovent nasal spray for nasal congestion/drainage. You can use over the counter nasal saline rinse such as neti pot for nasal congestion. Keep hydrated, your urine should be clear to pale yellow in color. Tylenol/motrin for fever and pain. Monitor for any worsening of symptoms, chest pain, shortness of breath, wheezing, swelling of the throat, go to the emergency department for further evaluation needed.

## 2019-10-10 NOTE — Telephone Encounter (Signed)
Aware, will cc to pcp

## 2019-10-12 ENCOUNTER — Other Ambulatory Visit (HOSPITAL_COMMUNITY): Payer: Self-pay | Admitting: Hematology

## 2019-10-12 DIAGNOSIS — C50912 Malignant neoplasm of unspecified site of left female breast: Secondary | ICD-10-CM

## 2019-10-16 NOTE — Telephone Encounter (Signed)
Agree, appreciate support.

## 2019-11-09 ENCOUNTER — Other Ambulatory Visit: Payer: Self-pay | Admitting: Family Medicine

## 2019-11-09 DIAGNOSIS — F331 Major depressive disorder, recurrent, moderate: Secondary | ICD-10-CM

## 2019-11-14 ENCOUNTER — Other Ambulatory Visit (HOSPITAL_COMMUNITY): Payer: Self-pay | Admitting: *Deleted

## 2019-11-14 DIAGNOSIS — M85852 Other specified disorders of bone density and structure, left thigh: Secondary | ICD-10-CM

## 2019-11-14 DIAGNOSIS — C50912 Malignant neoplasm of unspecified site of left female breast: Secondary | ICD-10-CM

## 2019-11-15 ENCOUNTER — Inpatient Hospital Stay (HOSPITAL_COMMUNITY): Payer: PRIVATE HEALTH INSURANCE | Attending: Hematology

## 2019-11-15 ENCOUNTER — Other Ambulatory Visit: Payer: Self-pay

## 2019-11-15 DIAGNOSIS — F329 Major depressive disorder, single episode, unspecified: Secondary | ICD-10-CM | POA: Diagnosis not present

## 2019-11-15 DIAGNOSIS — Z86718 Personal history of other venous thrombosis and embolism: Secondary | ICD-10-CM | POA: Insufficient documentation

## 2019-11-15 DIAGNOSIS — Z853 Personal history of malignant neoplasm of breast: Secondary | ICD-10-CM | POA: Diagnosis not present

## 2019-11-15 DIAGNOSIS — E039 Hypothyroidism, unspecified: Secondary | ICD-10-CM | POA: Diagnosis not present

## 2019-11-15 DIAGNOSIS — C50912 Malignant neoplasm of unspecified site of left female breast: Secondary | ICD-10-CM | POA: Diagnosis not present

## 2019-11-15 DIAGNOSIS — Z79811 Long term (current) use of aromatase inhibitors: Secondary | ICD-10-CM | POA: Diagnosis not present

## 2019-11-15 DIAGNOSIS — M8580 Other specified disorders of bone density and structure, unspecified site: Secondary | ICD-10-CM | POA: Diagnosis not present

## 2019-11-15 DIAGNOSIS — Z9012 Acquired absence of left breast and nipple: Secondary | ICD-10-CM | POA: Insufficient documentation

## 2019-11-15 DIAGNOSIS — Z170 Estrogen receptor positive status [ER+]: Secondary | ICD-10-CM | POA: Insufficient documentation

## 2019-11-15 DIAGNOSIS — Z923 Personal history of irradiation: Secondary | ICD-10-CM | POA: Diagnosis not present

## 2019-11-15 DIAGNOSIS — Z803 Family history of malignant neoplasm of breast: Secondary | ICD-10-CM | POA: Insufficient documentation

## 2019-11-15 DIAGNOSIS — Z79899 Other long term (current) drug therapy: Secondary | ICD-10-CM | POA: Insufficient documentation

## 2019-11-15 DIAGNOSIS — Z8249 Family history of ischemic heart disease and other diseases of the circulatory system: Secondary | ICD-10-CM | POA: Insufficient documentation

## 2019-11-15 DIAGNOSIS — Z7952 Long term (current) use of systemic steroids: Secondary | ICD-10-CM | POA: Insufficient documentation

## 2019-11-15 DIAGNOSIS — M85852 Other specified disorders of bone density and structure, left thigh: Secondary | ICD-10-CM

## 2019-11-15 DIAGNOSIS — Z833 Family history of diabetes mellitus: Secondary | ICD-10-CM | POA: Diagnosis not present

## 2019-11-15 DIAGNOSIS — Z801 Family history of malignant neoplasm of trachea, bronchus and lung: Secondary | ICD-10-CM | POA: Insufficient documentation

## 2019-11-15 DIAGNOSIS — Z87891 Personal history of nicotine dependence: Secondary | ICD-10-CM | POA: Insufficient documentation

## 2019-11-15 LAB — COMPREHENSIVE METABOLIC PANEL
ALT: 14 U/L (ref 0–44)
AST: 20 U/L (ref 15–41)
Albumin: 4 g/dL (ref 3.5–5.0)
Alkaline Phosphatase: 86 U/L (ref 38–126)
Anion gap: 10 (ref 5–15)
BUN: 20 mg/dL (ref 6–20)
CO2: 24 mmol/L (ref 22–32)
Calcium: 9.5 mg/dL (ref 8.9–10.3)
Chloride: 101 mmol/L (ref 98–111)
Creatinine, Ser: 0.98 mg/dL (ref 0.44–1.00)
GFR calc Af Amer: >60 mL/min (ref >60)
GFR calc non Af Amer: >60 mL/min (ref >60)
Glucose, Bld: 96 mg/dL (ref 70–99)
Potassium: 3.9 mmol/L (ref 3.5–5.1)
Sodium: 135 mmol/L (ref 135–145)
Total Bilirubin: 0.5 mg/dL (ref 0.3–1.2)
Total Protein: 6.9 g/dL (ref 6.5–8.1)

## 2019-11-15 LAB — CBC WITH DIFFERENTIAL/PLATELET
Abs Immature Granulocytes: 0.02 10*3/uL (ref 0.00–0.07)
Basophils Absolute: 0 10*3/uL (ref 0.0–0.1)
Basophils Relative: 0 %
Eosinophils Absolute: 0.1 10*3/uL (ref 0.0–0.5)
Eosinophils Relative: 2 %
HCT: 38.3 % (ref 36.0–46.0)
Hemoglobin: 13.2 g/dL (ref 12.0–15.0)
Immature Granulocytes: 0 %
Lymphocytes Relative: 28 %
Lymphs Abs: 2.2 10*3/uL (ref 0.7–4.0)
MCH: 35.3 pg — ABNORMAL HIGH (ref 26.0–34.0)
MCHC: 34.5 g/dL (ref 30.0–36.0)
MCV: 102.4 fL — ABNORMAL HIGH (ref 80.0–100.0)
Monocytes Absolute: 0.6 10*3/uL (ref 0.1–1.0)
Monocytes Relative: 8 %
Neutro Abs: 5 10*3/uL (ref 1.7–7.7)
Neutrophils Relative %: 62 %
Platelets: 219 10*3/uL (ref 150–400)
RBC: 3.74 MIL/uL — ABNORMAL LOW (ref 3.87–5.11)
RDW: 12.5 % (ref 11.5–15.5)
WBC: 7.9 10*3/uL (ref 4.0–10.5)
nRBC: 0 % (ref 0.0–0.2)

## 2019-11-21 ENCOUNTER — Other Ambulatory Visit: Payer: Self-pay

## 2019-11-21 ENCOUNTER — Inpatient Hospital Stay (HOSPITAL_BASED_OUTPATIENT_CLINIC_OR_DEPARTMENT_OTHER): Payer: PRIVATE HEALTH INSURANCE | Admitting: Nurse Practitioner

## 2019-11-21 VITALS — BP 124/66 | HR 74 | Temp 96.5°F | Resp 18 | Wt 130.8 lb

## 2019-11-21 DIAGNOSIS — M85852 Other specified disorders of bone density and structure, left thigh: Secondary | ICD-10-CM

## 2019-11-21 DIAGNOSIS — Z1231 Encounter for screening mammogram for malignant neoplasm of breast: Secondary | ICD-10-CM | POA: Diagnosis not present

## 2019-11-21 DIAGNOSIS — C50912 Malignant neoplasm of unspecified site of left female breast: Secondary | ICD-10-CM

## 2019-11-21 NOTE — Progress Notes (Signed)
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  This patient is considered OSTEOPENIC according to [facility]) criteria.      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 Bowermaster 49 y.o. female returns for routine follow-up for breast cancer.  Patient reports she is doing well since her last visit.  She is taking her medication as prescribed with no side effects. Denies any nausea, vomiting, or diarrhea. Denies any new pains. Had not noticed any recent bleeding such as epistaxis, hematuria or hematochezia. Denies recent chest pain on exertion, shortness of breath on minimal exertion, pre-syncopal episodes, or palpitations. Denies any numbness or tingling in hands or feet. Denies any recent fevers, infections, or recent hospitalizations. Patient reports appetite at 100% and energy level at 100%.  She is eating well maintain her weight is time.    REVIEW OF SYSTEMS:  Review of Systems  All other systems reviewed and are negative.    PAST MEDICAL/SURGICAL HISTORY:  Past Medical History:  Diagnosis Date  . Acute medial meniscus tear of left knee 04/20/2016  . Breast cancer ([facility])    2010 ; left side mastectomy chemo pill  . Depression   . DVT (deep venous thrombosis) ([facility])    left arm  . Family  history of breast cancer in sister    sister with PALB2 mutation  . 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 Hypothyroidism   . 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 Osteopenia 09/01/2014  . PTSD (post-traumatic stress disorder) 08/15/2014  . Rupture of posterior cruciate ligament of left knee 04/20/2016  . 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 Thyroid disease    Past Surgical History:  Procedure Laterality Date  . BREAST CAPSULECTOMY WITH IMPLANT EXCHANGE Left 08/19/2011  . BREAST RECONSTRUCTION Left 01/15/2011  . MINOR IRRIGATION AND DEBRIDEMENT OF WOUND Left 03/05/2011   back and left breast  . MODIFIED RADICAL MASTECTOMY Left 10/08/2009  . PLACEMENT OF BREAST IMPLANTS Right 08/19/2011  . PORT-A-CATH REMOVAL  07/15/2010  . PORTACATH PLACEMENT  11/14/2009   with left breast wound revision     SOCIAL HISTORY:  Social History   Socioeconomic History  . Marital status: Divorced    Spouse name: Not on file  . Number of children: 2  . Years of education: Some College  . Highest education level: Not on file  Occupational History  . Not on file  Tobacco Use  . Smoking status: Former Smoker    Packs/day: 0.50    Years: 14.00    Pack years: 7.00    Quit date: 09/22/2010    Years since quitting: 9.1  . Smokeless tobacco: Never Used  Substance and Sexual Activity  .  Alcohol use: Yes    Comment: rare use  . Drug use: No  . Sexual activity: Not Currently  Other Topics Concern  . 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 Difficulty of Paying Living Expenses:   Food Insecurity:   . Worried About Charity fundraiser in the Last Year:   . Arboriculturist in the Last Year:   Transportation Needs:   . Film/video editor (Medical):   Marland Kitchen Lack of Transportation (Non-Medical):   Physical Activity:   . Days of Exercise per Week:   . Minutes of Exercise per Session:   Stress:   . Feeling of Stress :   Social Connections:   . Frequency of Communication with Friends and Family:   . Frequency of Social Gatherings with Friends and Family:   . Attends Religious Services:   . Active Member of Clubs or Organizations:   . Attends Archivist Meetings:   Marland Kitchen Marital Status:   Intimate Partner Violence:   . Fear of Current or Ex-Partner:   . Emotionally Abused:   Marland Kitchen Physically Abused:   . Sexually Abused:     FAMILY HISTORY:  Family History  Problem Relation Age of Onset  . Hypertension Mother   . Heart disease Mother   . COPD Mother   . Other Mother        Cow valve  . Diabetes Father   . Stroke Father 66  . Hypertension Father   . Hypertension Maternal Grandmother   . Diabetes Paternal Grandmother   . Lung cancer Paternal Grandfather        deceased 16s; smoker  . Breast cancer Sister 79       currently 94; PALB2 mutation/ deceased  . Hypertension Brother     CURRENT MEDICATIONS:  Outpatient Encounter Medications as of 11/21/2019  Medication Sig  . anastrozole (ARIMIDEX) 1 MG tablet TAKE 1 TABLET BY MOUTH EVERY DAY  . buPROPion (WELLBUTRIN XL) 150 MG 24 hr tablet Take 1 tablet (150 mg total) by mouth daily.  . calcium-vitamin D (OSCAL WITH D) 500-200 MG-UNIT per tablet Take 1 tablet by mouth 2 (two) times daily.  Marland Kitchen levothyroxine (SYNTHROID) 100 MCG tablet Take 1 tablet (100 mcg total) by mouth daily. Please schedule an appointment for follow up  . denosumab (PROLIA) 60 MG/ML SOSY injection Inject 60 mg into the skin every 6 (six)  months.  . [DISCONTINUED] benzonatate (TESSALON) 100 MG capsule Take 1 capsule (100 mg total) by mouth every 8 (eight) hours.  . [DISCONTINUED] buPROPion (WELLBUTRIN XL) 150 MG 24 hr tablet TAKE 1 TABLET (150 MG TOTAL) BY MOUTH DAILY.  . [DISCONTINUED] predniSONE (DELTASONE) 20 MG tablet Take 1 tablet (20 mg total) by mouth daily.   No facility-administered encounter medications on file as of 11/21/2019.    ALLERGIES:  No Known Allergies   PHYSICAL EXAM:  ECOG Performance status: 1  Vitals:   11/21/19 1313  BP: 124/66  Pulse: 74  Resp: 18  Temp: (!) 96.5 F (  35.8 C)  SpO2: 98%   Filed Weights   11/21/19 1313  Weight: 130 lb 12.8 oz (59.3 kg)    Physical Exam Constitutional:      Appearance: Normal appearance. She is normal weight.  Cardiovascular:     Rate and Rhythm: Normal rate and regular rhythm.     Heart sounds: Normal heart sounds.  Pulmonary:     Effort: Pulmonary effort is normal.     Breath sounds: Normal breath sounds.  Abdominal:     General: Bowel sounds are normal.     Palpations: Abdomen is soft.  Musculoskeletal:        General: Normal range of motion.  Skin:    General: Skin is warm.  Neurological:     Mental Status: She is alert and oriented to person, place, and time. Mental status is at baseline.  Psychiatric:        Mood and Affect: Mood normal.        Behavior: Behavior normal.        Thought Content: Thought content normal.        Judgment: Judgment normal.   Breast: Left: No palpable masses, no skin changes or nipple discharge, no adenopathy. Right: No palpable masses, no skin changes or no adenopathy.  Implant intact.    LABORATORY DATA:  I have reviewed the labs as listed.  CBC    Component Value Date/Time   WBC 7.9 11/15/2019 1547   RBC 3.74 (L) 11/15/2019 1547   HGB 13.2 11/15/2019 1547   HCT 38.3 11/15/2019 1547   PLT 219 11/15/2019 1547   MCV 102.4 (H) 11/15/2019 1547   MCH 35.3 (H) 11/15/2019 1547   MCHC 34.5  11/15/2019 1547   RDW 12.5 11/15/2019 1547   LYMPHSABS 2.2 11/15/2019 1547   MONOABS 0.6 11/15/2019 1547   EOSABS 0.1 11/15/2019 1547   BASOSABS 0.0 11/15/2019 1547   CMP Latest Ref Rng & Units 11/15/2019 04/11/2019 10/02/2018  Glucose 70 - 99 mg/dL 96 106(H) 96  BUN 6 - 20 mg/dL _0 Creatinine 0.44 - 1.00 mg/dL 0.98 0.83 0.78  Sodium 135 - 145 mmol/L 135 137 133(L)  Potassium 3.5 - 5.1 mmol/L 3.9 3.8 3.3(L)  Chloride 98 - 111 mmol/L 101 102 98  CO2 22 - 32 mmol/L _1 Calcium 8.9 - 10.3 mg/dL 9.5 9.4 9.3  Total Protein 6.5 - 8.1 g/dL 6.9 7.2 6.7  Total Bilirubin 0.3 - 1.2 mg/dL 0.5 0.2(L) <0.1(L)  Alkaline Phos 38 - 126 U/L 86 67 69  AST 15 - 41 U/L _2 ALT 0 - 44 U/L _3 I personally performed a face-to-face visit,   All questions were answered to patient's stated satisfaction. Encouraged patient to call with any new concerns or questions before his next visit to the cancer center and we can certain see him sooner, if needed.      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  Stage III left breast cancer: -Left mastectomy and latissimus dorsi flap reconstruction on 10/08/2009, 2 out of 3 lymph nodes positive, Ki-67 20%, ER/PR positive and HER-2 negative. -Status post Epirubicin and Cytoxan x4 cycles followed by weekly Taxol x12 cycles. -Status post XRT finished on 06/19/2010. -Tamoxifen started on 06/13/2010. -Switched to Arimidex on 04/03/2014, continues to tolerate it well.  She will continue Arimidex until November 2021. -Last MRI of the right breast in November 2018 was  within normal limits. -Last mammogram on 07/09/2019 was B RADS category 1 negative. -Labs done on 11/15/2019 were all WNL -She will see [place] back in 6 months with repeat labs mammogram and DEXA scan.  2.  Osteopenia: -Last DEXA scan 10/2016 showed T score of -1.6 consistent with osteopenia. -She is receiving Prolia injections every 6 months. -She will continue  calcium and vitamin D supplements daily. -We will get a DEXA scan with her next visit.  3.  Genetic testing: -Sister was diagnosed with metastatic breast cancer was found to have PALB 2 mutation.  Patient was also tested positive for the same mutation.  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  She is at high risk for breast cancer, ovarian cancer, pancreatic cancer and prostate cancer Jerelene Redden. -Patient's youngest daughter who is also positive for the same mutation.      Orders placed this encounter:  Orders Placed This Encounter  Procedures  . DG Bone Density  . MM Digital Screening Unilat R  . Lactate dehydrogenase  . CBC with Differential/Platelet  . Comprehensive metabolic panel  . Vitamin B12  . VITAMIN D 25 Hydroxy (Vit-D Deficiency, Fractures)  . Folate     Francene Finders, FNP-C [place] [phone]

## 2019-11-21 NOTE — Patient Instructions (Signed)
[facility] at [facility] Discharge Instructions     Thank you for choosing [facility] at [facility] to provide your oncology and hematology care.  To afford each patient quality time with our provider, please arrive at least 15 minutes before your scheduled appointment time.   If you have a lab appointment with the Cancer Center please come in thru the Main Entrance and check in at the main information desk.  You need to re-schedule your appointment should you arrive 10 or more minutes late.  We strive to give you quality time with our providers, and arriving late affects you and other patients whose appointments are after yours.  Also, if you no show three or more times for appointments you may be dismissed from the clinic at the providers discretion.     Again, thank you for choosing [facility].  Our hope is that these requests will decrease the amount of time that you wait before being seen by our physicians.       _____________________________________________________________  Should you have questions after your visit to [facility], please contact our office at [phone] between the hours of 8:00 a.m. and 4:30 p.m.  Voicemails left after 4:00 p.m. will not be returned until the following business day.  For prescription refill requests, have your pharmacy contact our office and allow 72 hours.    Due to Covid, you will need to wear a mask upon entering the hospital. If you do not have a mask, a mask will be given to you at the Main Entrance upon arrival. For doctor visits, patients may have 1 support person with them. For treatment visits, patients can not have anyone with them due to social distancing guidelines and our immunocompromised population.      

## 2019-11-21 NOTE — Assessment & Plan Note (Addendum)
1.  Stage III left breast cancer: -Left mastectomy and latissimus dorsi flap reconstruction on 10/08/2009, 2 out of 3 lymph nodes positive, Ki-67 20%, ER/PR positive and HER-2 negative. -Status post Epirubicin and Cytoxan x4 cycles followed by weekly Taxol x12 cycles. -Status post XRT finished on 06/19/2010. -Tamoxifen started on 06/13/2010. -Switched to Arimidex on 04/03/2014, continues to tolerate it well.  She will continue Arimidex until November 2021. -Last MRI of the right breast in November 2018 was within normal limits. -Last mammogram on 07/09/2019 was B RADS category 1 negative. -Labs done on 11/15/2019 were all WNL -She will see [place] back in 6 months with repeat labs mammogram and DEXA scan.  2.  Osteopenia: -Last DEXA scan 10/2016 showed T score of -1.6 consistent with osteopenia. -She is receiving Prolia injections every 6 months. -She will continue calcium and vitamin D supplements daily. -We will get a DEXA scan with her next visit.  3.  Genetic testing: -Sister was diagnosed with metastatic breast cancer was found to have PALB 2 mutation.  Patient was also tested positive for the same mutation.  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  She is at high risk for breast cancer, ovarian cancer, pancreatic cancer and prostate cancer Jerelene Redden. -Patient's youngest daughter who is also positive for the same mutation.

## 2020-01-25 ENCOUNTER — Telehealth (INDEPENDENT_AMBULATORY_CARE_PROVIDER_SITE_OTHER): Payer: PRIVATE HEALTH INSURANCE | Admitting: Family Medicine

## 2020-01-25 ENCOUNTER — Encounter: Payer: Self-pay | Admitting: Family Medicine

## 2020-01-25 ENCOUNTER — Other Ambulatory Visit: Payer: Self-pay

## 2020-01-25 DIAGNOSIS — J0100 Acute maxillary sinusitis, unspecified: Secondary | ICD-10-CM | POA: Diagnosis not present

## 2020-01-25 DIAGNOSIS — J019 Acute sinusitis, unspecified: Secondary | ICD-10-CM | POA: Insufficient documentation

## 2020-01-25 MED ORDER — AMOXICILLIN-POT CLAVULANATE 875-125 MG PO TABS: 1.0000 | ORAL_TABLET | Freq: Two times a day (BID) | ORAL | 0 refills | Status: AC

## 2020-01-25 NOTE — Assessment & Plan Note (Addendum)
Ongoing sore throat for 1+ wk now with acute deterioration with R nasal passage obstruction, facial pressure, purulent mucous - anticipate bacterial sinus infection. Discussed how colored mucous alone doesn't suggest bacterial infection but rather duration and progression of illness. Will treat with augmentin 10d course with further supportive care - fluids, rest, mucinex, nasal saline irrigation. Update if not improving with treatment. Discussed bacterial sinus infections are typically not contagious. Update if not improving with treatment.

## 2020-01-25 NOTE — Progress Notes (Signed)
Virtual visit completed through MyChart, a video enabled telemedicine application. Due to national recommendations of social distancing due to COVID-19, a virtual visit is felt to be most appropriate for this patient at this time. Reviewed limitations, risks, security and privacy concerns of performing a virtual visit and the availability of in person appointments. I also reviewed that there may be a patient responsible charge related to this service. The patient agreed to proceed.   Patient location: home Provider location: [place] [facility], office Persons participating in this virtual visit: patient, provider   If any vitals were documented, they were collected by patient at home unless specified below.    Ht 5' (1.524 m)   Wt 125 lb (56.7 kg)   BMI 24.41 kg/m    CC: sinus drainage Subjective:    Patient ID: Renee [name], female    DOB: [birth year], 49 y.o.   MRN: [id number]  HPI: Renee [name] is a 49 y.o. female presenting on 01/25/2020 for Sinus Problem (Pt stated--sinus drainage(green), sore throat, sinus pressure-making teeth hurt--started yesterday. Low grade fever.)   ST with PNdrainage for the past week. Woke up this morning with R nostril stopped up, green mucous when blowing nose, hoarse voice. Bilateral maxillary sinus pain and tooth tenderness. Mild cough from ongoing PNdrainage.   Denies fevers/chills, Tmax 99.3. No dyspnea. No loss of taste or smell.  Treating with salt water gargle.   She works at nursing home. Tested Thursday for COVID - negative.  She did have COVID infection 09/2019, fortunately fully recovered.  No sick contacts at home.   She does tend to get sinus infections but last one was 5 yrs ago.      Relevant past medical, surgical, family and social history reviewed and updated as indicated. Interim medical history since our last visit reviewed. Allergies and medications reviewed and updated. Outpatient Medications Prior to Visit    Medication Sig Dispense Refill  . anastrozole (ARIMIDEX) 1 MG tablet TAKE 1 TABLET BY MOUTH EVERY DAY 90 tablet 3  . buPROPion (WELLBUTRIN XL) 150 MG 24 hr tablet Take 1 tablet (150 mg total) by mouth daily. 90 tablet 3  . calcium-vitamin D (OSCAL WITH D) 500-200 MG-UNIT per tablet Take 1 tablet by mouth 2 (two) times daily. 60 tablet 11  . levothyroxine (SYNTHROID) 100 MCG tablet Take 1 tablet (100 mcg total) by mouth daily. Please schedule an appointment for follow up 90 tablet 3  . MULTIPLE VITAMIN PO Take by mouth.    . denosumab (PROLIA) 60 MG/ML SOSY injection Inject 60 mg into the skin every 6 (six) months. (Patient not taking: Reported on 01/25/2020)     No facility-administered medications prior to visit.     Per HPI unless specifically indicated in ROS section below Review of Systems Objective:  Ht 5' (1.524 m)   Wt 125 lb (56.7 kg)   BMI 24.41 kg/m   Wt Readings from Last 3 Encounters:  01/25/20 125 lb (56.7 kg)  11/21/19 130 lb 12.8 oz (59.3 kg)  09/20/19 130 lb (59 kg)       Physical exam: Gen: alert, NAD, not ill appearing, hoarse voice present Pulm: speaks in complete sentences without increased work of breathing Psych: normal mood, normal thought content      Results for orders placed or performed in visit on 11/15/19  Comprehensive metabolic panel  Result Value Ref Range   Sodium 135 135 - 145 mmol/L   Potassium 3.9 3.5 - 5.1 mmol/L  Chloride 101 98 - 111 mmol/L   CO2 24 22 - 32 mmol/L   Glucose, Bld 96 70 - 99 mg/dL   BUN 20 6 - 20 mg/dL   Creatinine, Ser 0.98 0.44 - 1.00 mg/dL   Calcium 9.5 8.9 - 10.3 mg/dL   Total Protein 6.9 6.5 - 8.1 g/dL   Albumin 4.0 3.5 - 5.0 g/dL   AST 20 15 - 41 U/L   ALT 14 0 - 44 U/L   Alkaline Phosphatase 86 38 - 126 U/L   Total Bilirubin 0.5 0.3 - 1.2 mg/dL   GFR calc non Af Amer >60 >60 mL/min   GFR calc Af Amer >60 >60 mL/min   Anion gap 10 5 - 15  CBC with Differential  Result Value Ref Range   WBC 7.9 4.0 -  10.5 K/uL   RBC 3.74 (L) 3.87 - 5.11 MIL/uL   Hemoglobin 13.2 12.0 - 15.0 g/dL   HCT 38.3 36 - 46 %   MCV 102.4 (H) 80.0 - 100.0 fL   MCH 35.3 (H) 26.0 - 34.0 pg   MCHC 34.5 30.0 - 36.0 g/dL   RDW 12.5 11.5 - 15.5 %   Platelets 219 150 - 400 K/uL   nRBC 0.0 0.0 - 0.2 %   Neutrophils Relative % 62 %   Neutro Abs 5.0 1.7 - 7.7 K/uL   Lymphocytes Relative 28 %   Lymphs Abs 2.2 0.7 - 4.0 K/uL   Monocytes Relative 8 %   Monocytes Absolute 0.6 0 - 1 K/uL   Eosinophils Relative 2 %   Eosinophils Absolute 0.1 0 - 0 K/uL   Basophils Relative 0 %   Basophils Absolute 0.0 0 - 0 K/uL   Immature Granulocytes 0 %   Abs Immature Granulocytes 0.02 0.00 - 0.07 K/uL   Assessment & Plan:   Problem List Items Addressed This Visit    Acute sinusitis    Ongoing sore throat for 1+ wk now with acute deterioration with R nasal passage obstruction, facial pressure, purulent mucous - anticipate bacterial sinus infection. Discussed how colored mucous alone doesn't suggest bacterial infection but rather duration and progression of illness. Will treat with augmentin 10d course with further supportive care - fluids, rest, mucinex, nasal saline irrigation. Update if not improving with treatment. Discussed bacterial sinus infections are typically not contagious. Update if not improving with treatment.       Relevant Medications   amoxicillin-clavulanate (AUGMENTIN) 875-125 MG tablet       Meds ordered this encounter  Medications  . amoxicillin-clavulanate (AUGMENTIN) 875-125 MG tablet    Sig: Take 1 tablet by mouth 2 (two) times daily for 10 days.    Dispense:  20 tablet    Refill:  0   No orders of the defined types were placed in this encounter.   I discussed the assessment and treatment plan with the patient. The patient was provided an opportunity to ask questions and all were answered. The patient agreed with the plan and demonstrated an understanding of the instructions. The patient was advised to  call back or seek an in-person evaluation if the symptoms worsen or if the condition fails to improve as anticipated.  Follow up plan: No follow-ups on file.  Ria Bush, MD

## 2020-06-03 ENCOUNTER — Ambulatory Visit: Payer: PRIVATE HEALTH INSURANCE | Admitting: Psychology

## 2020-06-19 ENCOUNTER — Other Ambulatory Visit (HOSPITAL_COMMUNITY): Payer: Self-pay | Admitting: Hematology

## 2020-06-19 DIAGNOSIS — Z1231 Encounter for screening mammogram for malignant neoplasm of breast: Secondary | ICD-10-CM

## 2020-07-10 ENCOUNTER — Other Ambulatory Visit (HOSPITAL_COMMUNITY): Payer: Self-pay

## 2020-07-10 DIAGNOSIS — C50912 Malignant neoplasm of unspecified site of left female breast: Secondary | ICD-10-CM

## 2020-07-11 ENCOUNTER — Other Ambulatory Visit (HOSPITAL_COMMUNITY): Payer: PRIVATE HEALTH INSURANCE

## 2020-07-11 ENCOUNTER — Inpatient Hospital Stay (HOSPITAL_COMMUNITY): Payer: PRIVATE HEALTH INSURANCE | Attending: Hematology

## 2020-07-11 ENCOUNTER — Other Ambulatory Visit: Payer: Self-pay

## 2020-07-11 ENCOUNTER — Ambulatory Visit (HOSPITAL_COMMUNITY): Payer: PRIVATE HEALTH INSURANCE

## 2020-07-11 DIAGNOSIS — E039 Hypothyroidism, unspecified: Secondary | ICD-10-CM | POA: Diagnosis not present

## 2020-07-11 DIAGNOSIS — Z7952 Long term (current) use of systemic steroids: Secondary | ICD-10-CM | POA: Diagnosis not present

## 2020-07-11 DIAGNOSIS — Z833 Family history of diabetes mellitus: Secondary | ICD-10-CM | POA: Insufficient documentation

## 2020-07-11 DIAGNOSIS — Z170 Estrogen receptor positive status [ER+]: Secondary | ICD-10-CM | POA: Insufficient documentation

## 2020-07-11 DIAGNOSIS — Z86718 Personal history of other venous thrombosis and embolism: Secondary | ICD-10-CM | POA: Diagnosis not present

## 2020-07-11 DIAGNOSIS — Z8249 Family history of ischemic heart disease and other diseases of the circulatory system: Secondary | ICD-10-CM | POA: Diagnosis not present

## 2020-07-11 DIAGNOSIS — Z79899 Other long term (current) drug therapy: Secondary | ICD-10-CM | POA: Insufficient documentation

## 2020-07-11 DIAGNOSIS — Z87891 Personal history of nicotine dependence: Secondary | ICD-10-CM | POA: Diagnosis not present

## 2020-07-11 DIAGNOSIS — Z803 Family history of malignant neoplasm of breast: Secondary | ICD-10-CM | POA: Insufficient documentation

## 2020-07-11 DIAGNOSIS — Z801 Family history of malignant neoplasm of trachea, bronchus and lung: Secondary | ICD-10-CM | POA: Insufficient documentation

## 2020-07-11 DIAGNOSIS — Z9012 Acquired absence of left breast and nipple: Secondary | ICD-10-CM | POA: Insufficient documentation

## 2020-07-11 DIAGNOSIS — F329 Major depressive disorder, single episode, unspecified: Secondary | ICD-10-CM | POA: Insufficient documentation

## 2020-07-11 DIAGNOSIS — M8580 Other specified disorders of bone density and structure, unspecified site: Secondary | ICD-10-CM | POA: Diagnosis not present

## 2020-07-11 DIAGNOSIS — C50912 Malignant neoplasm of unspecified site of left female breast: Secondary | ICD-10-CM | POA: Insufficient documentation

## 2020-07-11 DIAGNOSIS — Z79811 Long term (current) use of aromatase inhibitors: Secondary | ICD-10-CM | POA: Diagnosis not present

## 2020-07-11 DIAGNOSIS — Z923 Personal history of irradiation: Secondary | ICD-10-CM | POA: Diagnosis not present

## 2020-07-11 LAB — VITAMIN D 25 HYDROXY (VIT D DEFICIENCY, FRACTURES): Vit D, 25-Hydroxy: 24.33 ng/mL — ABNORMAL LOW (ref 30–100)

## 2020-07-11 LAB — COMPREHENSIVE METABOLIC PANEL
ALT: 15 U/L (ref 0–44)
AST: 20 U/L (ref 15–41)
Albumin: 3.9 g/dL (ref 3.5–5.0)
Alkaline Phosphatase: 95 U/L (ref 38–126)
Anion gap: 11 (ref 5–15)
BUN: 25 mg/dL — ABNORMAL HIGH (ref 6–20)
CO2: 21 mmol/L — ABNORMAL LOW (ref 22–32)
Calcium: 8.9 mg/dL (ref 8.9–10.3)
Chloride: 103 mmol/L (ref 98–111)
Creatinine, Ser: 0.73 mg/dL (ref 0.44–1.00)
GFR, Estimated: >60 mL/min (ref >60)
Glucose, Bld: 141 mg/dL — ABNORMAL HIGH (ref 70–99)
Potassium: 3.2 mmol/L — ABNORMAL LOW (ref 3.5–5.1)
Sodium: 135 mmol/L (ref 135–145)
Total Bilirubin: 0.3 mg/dL (ref 0.3–1.2)
Total Protein: 7.1 g/dL (ref 6.5–8.1)

## 2020-07-11 LAB — CBC WITH DIFFERENTIAL/PLATELET
Abs Immature Granulocytes: 0.05 10*3/uL (ref 0.00–0.07)
Basophils Absolute: 0.1 10*3/uL (ref 0.0–0.1)
Basophils Relative: 1 %
Eosinophils Absolute: 0.2 10*3/uL (ref 0.0–0.5)
Eosinophils Relative: 2 %
HCT: 39.9 % (ref 36.0–46.0)
Hemoglobin: 13.3 g/dL (ref 12.0–15.0)
Immature Granulocytes: 1 %
Lymphocytes Relative: 32 %
Lymphs Abs: 2.7 10*3/uL (ref 0.7–4.0)
MCH: 35 pg — ABNORMAL HIGH (ref 26.0–34.0)
MCHC: 33.3 g/dL (ref 30.0–36.0)
MCV: 105 fL — ABNORMAL HIGH (ref 80.0–100.0)
Monocytes Absolute: 0.7 10*3/uL (ref 0.1–1.0)
Monocytes Relative: 9 %
Neutro Abs: 4.9 10*3/uL (ref 1.7–7.7)
Neutrophils Relative %: 55 %
Platelets: 217 10*3/uL (ref 150–400)
RBC: 3.8 MIL/uL — ABNORMAL LOW (ref 3.87–5.11)
RDW: 12.7 % (ref 11.5–15.5)
WBC: 8.7 10*3/uL (ref 4.0–10.5)
nRBC: 0 % (ref 0.0–0.2)

## 2020-07-11 LAB — FOLATE: Folate: 9.3 ng/mL (ref >5.9)

## 2020-07-11 LAB — VITAMIN B12: Vitamin B-12: 246 pg/mL (ref 180–914)

## 2020-07-11 LAB — LACTATE DEHYDROGENASE: LDH: 168 U/L (ref 98–192)

## 2020-07-14 ENCOUNTER — Ambulatory Visit (HOSPITAL_COMMUNITY)
Admission: RE | Admit: 2020-07-14 | Discharge: 2020-07-14 | Disposition: A | Discharge status: Home / Self Care | Payer: PRIVATE HEALTH INSURANCE | Source: Ambulatory Visit | Attending: Hematology | Admitting: Hematology

## 2020-07-14 ENCOUNTER — Ambulatory Visit (HOSPITAL_COMMUNITY)
Admission: RE | Admit: 2020-07-14 | Discharge: 2020-07-14 | Disposition: A | Discharge status: Home / Self Care | Payer: PRIVATE HEALTH INSURANCE | Source: Ambulatory Visit | Attending: Nurse Practitioner | Admitting: Nurse Practitioner

## 2020-07-14 ENCOUNTER — Other Ambulatory Visit: Payer: Self-pay

## 2020-07-14 DIAGNOSIS — Z1231 Encounter for screening mammogram for malignant neoplasm of breast: Secondary | ICD-10-CM | POA: Diagnosis present

## 2020-07-14 DIAGNOSIS — M85852 Other specified disorders of bone density and structure, left thigh: Secondary | ICD-10-CM

## 2020-07-23 ENCOUNTER — Ambulatory Visit (HOSPITAL_COMMUNITY): Payer: PRIVATE HEALTH INSURANCE | Admitting: Oncology

## 2020-08-14 ENCOUNTER — Other Ambulatory Visit: Payer: Self-pay

## 2020-08-14 ENCOUNTER — Inpatient Hospital Stay (HOSPITAL_COMMUNITY): Payer: PRIVATE HEALTH INSURANCE | Attending: Hematology | Admitting: Oncology

## 2020-08-14 DIAGNOSIS — C50912 Malignant neoplasm of unspecified site of left female breast: Secondary | ICD-10-CM

## 2020-08-14 MED ORDER — ERGOCALCIFEROL 1.25 MG (50000 UT) PO CAPS: 50000.0000 [IU] | ORAL_CAPSULE | ORAL | 0 refills | Status: DC

## 2020-08-14 NOTE — Progress Notes (Signed)
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  This patient is considered OSTEOPENIC according to [place]) criteria.    I connected with Renee [name] on 08/14/20 at  1:10 PM EST by telephone visit and verified that I am speaking with the correct person using two identifiers.   I discussed the limitations, risks, security and privacy concerns of performing an evaluation and management service by telemedicine and the availability of in-person appointments. I also discussed with the patient that there may be a patient responsible charge related to this service. The patient expressed understanding and agreed to proceed.   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 female returns for routine follow-up for breast cancer. She was last seen in clinic on  11/21/19.   She is doing well since her last visit.  She stopped her Arimidex in November 2021 after 10 years of antihormone therapy. Denies any nausea, vomiting, or diarrhea. Denies any new pains. Had not noticed any recent bleeding such as epistaxis, hematuria or hematochezia. Denies recent chest pain on  exertion, shortness of breath on minimal exertion, pre-syncopal episodes, or palpitations. Denies any numbness or tingling in hands or feet. Denies any recent fevers, infections, or recent hospitalizations. Patient reports appetite at 100% and energy level at 100%.  She is eating well maintain her weight is time.  REVIEW OF SYSTEMS:  Review of Systems  All other systems reviewed and are negative.    PAST MEDICAL/SURGICAL HISTORY:  Past Medical History:  Diagnosis Date  . Acute medial meniscus tear of left knee 04/20/2016  . Breast cancer ([place])    2010 ; left side mastectomy chemo pill  . Depression   . DVT (deep venous thrombosis) (HCC)    left arm  . Family history of breast cancer in sister    sister with PALB2 mutation  . 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 Hypothyroidism   . 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 Osteopenia 09/01/2014  . PTSD (post-traumatic stress disorder) 08/15/2014  . Rupture of posterior cruciate ligament of left knee 04/20/2016  . 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 Thyroid disease    Past Surgical History:  Procedure Laterality Date  . BREAST CAPSULECTOMY WITH IMPLANT EXCHANGE Left 08/19/2011  . BREAST RECONSTRUCTION Left 01/15/2011  . MINOR IRRIGATION AND DEBRIDEMENT OF WOUND Left 03/05/2011   back and left breast  . MODIFIED RADICAL MASTECTOMY Left 10/08/2009  . PLACEMENT OF BREAST IMPLANTS Right 08/19/2011  . PORT-A-CATH REMOVAL  07/15/2010  . PORTACATH PLACEMENT  11/14/2009  with left breast wound revision     SOCIAL HISTORY:  Social History   Socioeconomic History  . Marital status: Divorced    Spouse name: Not on file  . Number of children: 2  . Years of education: Some College  . Highest education level: Not on file  Occupational History  . Not on file  Tobacco Use  . Smoking status: Former Smoker     Packs/day: 0.50    Years: 14.00    Pack years: 7.00    Quit date: 09/22/2010    Years since quitting: 9.9  . Smokeless tobacco: Never Used  Vaping Use  . Vaping Use: Never used  Substance and Sexual Activity  . Alcohol use: Yes    Comment: rare use  . Drug use: No  . Sexual activity: Not Currently  Other Topics Concern  . 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 Hypertension Mother   . Heart disease Mother   . COPD Mother   . Other Mother        Cow valve  . Diabetes Father   . Stroke Father 97  . Hypertension Father   . Hypertension Maternal Grandmother   . Diabetes Paternal Grandmother   . Lung cancer Paternal Grandfather        deceased 33s; smoker  . Breast cancer Sister 19       currently 67; PALB2 mutation/ deceased  . Hypertension Brother     CURRENT MEDICATIONS:  Outpatient Encounter Medications as of 08/14/2020  Medication Sig  . anastrozole (ARIMIDEX) 1 MG tablet TAKE 1 TABLET BY MOUTH EVERY DAY  . buPROPion (WELLBUTRIN XL) 150 MG 24 hr tablet Take 1 tablet (150 mg total) by mouth daily.  . calcium-vitamin D (OSCAL WITH D) 500-200 MG-UNIT per tablet Take 1 tablet by mouth 2 (two) times daily.  Marland Kitchen denosumab (PROLIA) 60 MG/ML SOSY injection Inject 60 mg into the skin every 6 (six) months. (Patient not taking:  Reported on 01/25/2020)  . levothyroxine (SYNTHROID) 100 MCG tablet Take 1 tablet (100 mcg total) by mouth daily. Please schedule an appointment for follow up  . MULTIPLE VITAMIN PO Take by mouth.   No facility-administered encounter medications on file as of 08/14/2020.    ALLERGIES:  No Known Allergies   PHYSICAL EXAM:  ECOG Performance status: 1  There were no vitals filed for this visit. There were no vitals filed for this visit.  Physical Exam -Deferred secondary to telephone visit. -Patient in no acute distress. -Patient is speaking in full sentences.   LABORATORY DATA:  I have reviewed the labs as listed.  CBC    Component Value Date/Time   WBC 8.7 07/11/2020 1522   RBC 3.80 (L) 07/11/2020 1522  HGB 13.3 07/11/2020 1522   HCT 39.9 07/11/2020 1522   PLT 217 07/11/2020 1522   MCV 105.0 (H) 07/11/2020 1522   MCH 35.0 (H) 07/11/2020 1522   MCHC 33.3 07/11/2020 1522   RDW 12.7 07/11/2020 1522   LYMPHSABS 2.7 07/11/2020 1522   MONOABS 0.7 07/11/2020 1522   EOSABS 0.2 07/11/2020 1522   BASOSABS 0.1 07/11/2020 1522   CMP Latest Ref Rng & Units 07/11/2020 11/15/2019 04/11/2019  Glucose 70 - 99 mg/dL 141(H) 96 106(H)  BUN 6 - 20 mg/dL 25(H) 20 19  Creatinine 0.44 - 1.00 mg/dL 0.73 0.98 0.83  Sodium 135 - 145 mmol/L 135 135 137  Potassium 3.5 - 5.1 mmol/L 3.2(L) 3.9 3.8  Chloride 98 - 111 mmol/L 103 101 102  CO2 22 - 32 mmol/L 21(L) 24 25  Calcium 8.9 - 10.3 mg/dL 8.9 9.5 9.4  Total Protein 6.5 - 8.1 g/dL 7.1 6.9 7.2  Total Bilirubin 0.3 - 1.2 mg/dL 0.3 0.5 0.2(L)  Alkaline Phos 38 - 126 U/L 95 86 67  AST 15 - 41 U/L '20 20 20  ' ALT 0 - 44 U/L '15 14 12     ' I personally performed a face-to-face visit,   All questions were answered to patient's stated satisfaction. Encouraged patient to call with any new concerns or questions before his next visit to the cancer center and we can certain see him sooner, if needed.      ASSESSMENT & PLAN:  1.  Stage III left breast  cancer: -Left mastectomy and latissimus dorsi flap reconstruction on 10/08/2009, 2 out of 3 lymph nodes positive, Ki-67 20%, ER/PR positive and HER-2 negative. -Status post Epirubicin and Cytoxan x4 cycles followed by weekly Taxol x12 cycles. -Status post XRT finished on 06/19/2010. -Tamoxifen started on 06/13/2010. -Switched to Arimidex on 04/03/2014. -Completed 10 years of antihormone treatment in November 2021. -Last MRI of the right breast in November 2018 was within normal limits. -Last mammogram on 07/14/2020 was B RADS category 1 negative.  2.  Osteopenia: -Repeat DEXA from 07/14/2020 shows a T score of -1.8 (-1.6) from consistent with osteopenia. -Stopped Prolia injections. -Vitamin D level is 24.33. -Recommend ergocalciferol 50,000 units weekly x6 months. -Recheck vitamin D level at next visit.  3.  Genetic testing: -Sister was diagnosed with metastatic breast cancer was found to have PALB 2 mutation.  Patient was also tested positive for the same mutation.  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  She is at high risk for breast cancer, ovarian cancer, pancreatic cancer and prostate cancer Jerelene Redden. -Patient's youngest daughter who is also positive for the same mutation.  4. Hypokalemia: -Potassium is 3.2. -Recommend potassium rich diet.  Disposition: -RTC in 6 months with repeat labs and MD assessment.  No problem-specific Assessment & Plan notes found for this encounter.  I provided 15 minutes of non face-to-face telephone visit time during this encounter, and > 50% was spent counseling as documented under my assessment & plan.  Orders placed this encounter:  No orders of the defined types were placed in this encounter.  Faythe Casa, NP 08/14/2020 1:25 PM  [place] [phone]

## 2020-09-01 ENCOUNTER — Other Ambulatory Visit: Payer: Self-pay | Admitting: Family Medicine

## 2020-09-01 DIAGNOSIS — F331 Major depressive disorder, recurrent, moderate: Secondary | ICD-10-CM

## 2020-09-04 ENCOUNTER — Other Ambulatory Visit: Payer: Self-pay | Admitting: Family Medicine

## 2020-09-04 DIAGNOSIS — E890 Postprocedural hypothyroidism: Secondary | ICD-10-CM

## 2020-09-04 NOTE — Telephone Encounter (Signed)
Please get pt scheduled for appt. Hasn't been seen in a year. 30 day supply sent in.

## 2020-09-05 NOTE — Telephone Encounter (Signed)
Called and scheduled for OV on 2/3.

## 2020-09-11 ENCOUNTER — Ambulatory Visit (INDEPENDENT_AMBULATORY_CARE_PROVIDER_SITE_OTHER): Payer: PRIVATE HEALTH INSURANCE | Admitting: Family Medicine

## 2020-09-11 ENCOUNTER — Encounter: Payer: Self-pay | Admitting: Family Medicine

## 2020-09-11 ENCOUNTER — Other Ambulatory Visit: Payer: Self-pay

## 2020-09-11 VITALS — BP 126/72 | HR 76 | Temp 98.4°F | Ht 60.0 in | Wt 147.8 lb

## 2020-09-11 DIAGNOSIS — E890 Postprocedural hypothyroidism: Secondary | ICD-10-CM

## 2020-09-11 DIAGNOSIS — Z1211 Encounter for screening for malignant neoplasm of colon: Secondary | ICD-10-CM

## 2020-09-11 DIAGNOSIS — F331 Major depressive disorder, recurrent, moderate: Secondary | ICD-10-CM | POA: Diagnosis not present

## 2020-09-11 DIAGNOSIS — Z0001 Encounter for general adult medical examination with abnormal findings: Secondary | ICD-10-CM | POA: Diagnosis not present

## 2020-09-11 DIAGNOSIS — Z114 Encounter for screening for human immunodeficiency virus [HIV]: Secondary | ICD-10-CM

## 2020-09-11 DIAGNOSIS — F322 Major depressive disorder, single episode, severe without psychotic features: Secondary | ICD-10-CM

## 2020-09-11 DIAGNOSIS — F900 Attention-deficit hyperactivity disorder, predominantly inattentive type: Secondary | ICD-10-CM

## 2020-09-11 DIAGNOSIS — Z1159 Encounter for screening for other viral diseases: Secondary | ICD-10-CM

## 2020-09-11 MED ORDER — BUPROPION HCL ER (XL) 300 MG PO TB24: 300.0000 mg | ORAL_TABLET | Freq: Every day | ORAL | 3 refills | Status: DC

## 2020-09-11 NOTE — Assessment & Plan Note (Signed)
She notes worsening symptoms. Willing to try increasing wellbutrin 150 mg >300 mg. Return 1 month

## 2020-09-11 NOTE — Assessment & Plan Note (Signed)
Has been off medication for 3 years. Discussed in setting of worsening depression recommend increased wellbutrin and return in 1 month if not improving

## 2020-09-11 NOTE — Progress Notes (Signed)
Annual Exam   Chief Complaint:  Chief Complaint  Patient presents with  . Medication Refill    History of Present Illness:  [name] is a 50 y.o. No obstetric history on file. who LMP was No LMP recorded. Patient is postmenopausal., presents today for her annual examination.     Nutrition She does not get adequate calcium and Vitamin D in her diet. Diet: pretty healthy Exercise: walking daily  Safety The patient wears seatbelts: yes.     The patient feels safe at home and in their relationships: yes.   Menstrual:  Symptoms of menopause: none S/p breast cancer treatment  GYN She is not sexually active.    Cervical Cancer Screening (21-65):   Last Pap:   November 2016 Results were: no abnormalities /neg HPV DNA   Breast Cancer Screening (Age 57-74):  There is no FH of breast cancer. There is no FH of ovarian cancer. BRCA screening Not Indicated.  Last Mammogram: 07/2020 The patient does want a mammogram this year.    Colon Cancer Screening:  Age 69-75 yo - benefits outweigh the risk. Adults 35-85 yo who have never been screened benefit.  Benefits: 134000 people in 2016 will be diagnosed and 49,000 will die - early detection helps [place]: Complications 2/2 to colonoscopy High Risk (Colonoscopy): genetic disorder (Lynch syndrome or familial adenomatous polyposis), personal hx of IBD, previous adenomatous polyp, or previous colorectal cancer, FamHx start 10 years before the age at diagnosis, increased in males and black race  Options:  FIT - looks for hemoglobin (blood in the stool) - specific and fairly sensitive - must be done annually Cologuard - looks for DNA and blood - more sensitive - therefore can have more false positives, every 3 years Colonoscopy - every 10 years if normal - sedation, bowl prep, must have someone drive you  Shared decision making and the patient had decided to do cologuard.   Social History   Tobacco Use  Smoking Status Former  Smoker  . Packs/day: 0.50  . Years: 14.00  . Pack years: 7.00  . Quit date: 09/22/2010  . Years since quitting: 9.9  Smokeless Tobacco Never Used    Lung Cancer Screening (Ages 94-70): not applicable   Weight Wt Readings from Last 3 Encounters:  09/11/20 147 lb 12.8 oz (67 kg)  01/25/20 125 lb (56.7 kg)  11/21/19 130 lb 12.8 oz (59.3 kg)   Patient has high BMI  BMI Readings from Last 1 Encounters:  09/11/20 28.87 kg/m     Chronic disease screening Blood pressure monitoring:  BP Readings from Last 3 Encounters:  09/11/20 126/72  11/21/19 124/66  10/10/19 (!) 144/90    Lipid Monitoring: Indication for screening: age >39, obesity, diabetes, family hx, CV risk factors.  Lipid screening: Yes  Lab Results  Component Value Date   CHOL 241 (H) 08/22/2019   HDL 70.90 08/22/2019   LDLCALC 152 (H) 08/22/2019   TRIG 90.0 08/22/2019   CHOLHDL 3 08/22/2019     Diabetes Screening: age >64, overweight, family hx, PCOS, hx of gestational diabetes, at risk ethnicity Diabetes Screening screening: Yes  No results found for: HGBA1C   Past Medical History:  Diagnosis Date  . Acute medial meniscus tear of left knee 04/20/2016  . Breast cancer ([place])    2010 ; left side mastectomy chemo pill  . Depression   . DVT (deep venous thrombosis) ([facility])    left arm  . Family history of breast cancer in sister  sister with PALB2 mutation  . 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 Hypothyroidism   . 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 Osteopenia 09/01/2014  . PTSD (post-traumatic stress disorder) 08/15/2014  . Rupture of posterior cruciate ligament of left knee 04/20/2016  . 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 Thyroid disease     Past Surgical History:  Procedure Laterality Date  . BREAST CAPSULECTOMY WITH IMPLANT EXCHANGE Left 08/19/2011  . BREAST  RECONSTRUCTION Left 01/15/2011  . MINOR IRRIGATION AND DEBRIDEMENT OF WOUND Left 03/05/2011   back and left breast  . MODIFIED RADICAL MASTECTOMY Left 10/08/2009  . PLACEMENT OF BREAST IMPLANTS Right 08/19/2011  . PORT-A-CATH REMOVAL  07/15/2010  . PORTACATH PLACEMENT  11/14/2009   with left breast wound revision    Prior to Admission medications   Medication Sig Start Date End Date Taking? Authorizing Provider  buPROPion (WELLBUTRIN XL) 150 MG 24 hr tablet Take 1 tablet (150 mg total) by mouth daily. 11/09/19  Yes Lesleigh Noe, MD  calcium-vitamin D (OSCAL WITH D) 500-200 MG-UNIT per tablet Take 1 tablet by mouth 2 (two) times daily. 09/04/14  Yes Kefalas, Manon Hilding, PA-C  ergocalciferol (VITAMIN D2) 1.25 MG (50000 UT) capsule Take 1 capsule (50,000 Units total) by mouth once a week. 08/14/20  Yes Burns, Wandra Feinstein, NP  levothyroxine (SYNTHROID) 100 MCG tablet TAKE 1 TABLET (100 MCG TOTAL) BY MOUTH DAILY. PLEASE SCHEDULE AN APPOINTMENT FOR FOLLOW UP 09/04/20  Yes Lesleigh Noe, MD  MULTIPLE VITAMIN PO Take by mouth.   Yes [provider]  anastrozole (ARIMIDEX) 1 MG tablet TAKE 1 TABLET BY MOUTH EVERY DAY Patient not taking: Reported on 09/11/2020 10/12/19   Glennie Isle, NP-C  denosumab (PROLIA) 60 MG/ML SOSY injection Inject 60 mg into the skin every 6 (six) months. Patient not taking: No sig reported    [provider]    No Known Allergies  Gynecologic History: No LMP recorded. Patient is postmenopausal.  Obstetric History: No obstetric history on file.  Social History   Socioeconomic History  . Marital status: Divorced    Spouse name: Not on file  . Number of children: 2  . Years of education: Some College  . Highest education level: Not on file  Occupational History  . Not on file  Tobacco Use  . Smoking status: Former Smoker    Packs/day: 0.50    Years: 14.00    Pack years: 7.00    Quit date: 09/22/2010    Years since quitting: 9.9  . Smokeless  tobacco: Never Used  Vaping Use  . Vaping Use: Never used  Substance and Sexual Activity  . Alcohol use: Yes    Comment: rare use  . Drug use: No  . Sexual activity: Not Currently  Other Topics Concern  . 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 Hypertension Mother   . Heart disease Mother   . COPD Mother   . Other Mother        Cow valve  . Diabetes Father   . Stroke Father 45  . Hypertension Father   . Hypertension Maternal Grandmother   . Diabetes Paternal Grandmother   . Lung cancer Paternal Grandfather        deceased 50s; smoker  . Breast cancer Sister 67       currently 50; PALB2 mutation/ deceased  . Hypertension Brother     Review of Systems  Constitutional: Negative for chills and fever.  HENT: Negative for congestion and sore throat.   Eyes: Negative for blurred vision and double vision.  Respiratory: Negative for shortness of breath.   Cardiovascular: Negative for chest pain.  Gastrointestinal: Negative for heartburn, nausea and vomiting.  Genitourinary: Negative.   Musculoskeletal: Negative.  Negative for myalgias.  Skin: Negative for rash.  Neurological: Negative for dizziness and headaches.  Endo/Heme/Allergies: Does not bruise/bleed easily.  Psychiatric/Behavioral: Negative for depression. The patient is not  nervous/anxious.      Physical Exam BP 126/72   Pulse 76   Temp 98.4 F (36.9 C) (Oral)   Ht 5' (1.524 m)   Wt 147 lb 12.8 oz (67 kg)   SpO2 99%   BMI 28.87 kg/m    BP Readings from Last 3 Encounters:  09/11/20 126/72  11/21/19 124/66  10/10/19 (!) 144/90      Physical Exam Constitutional:      General: She is not in acute distress.    Appearance: She is well-developed and well-nourished. She is not diaphoretic.  HENT:     Head: Normocephalic and atraumatic.     Right Ear: External ear normal.     Left Ear: External ear normal.     Nose: Nose normal.     Mouth/Throat:     Pharynx: Posterior oropharyngeal erythema present.  Eyes:     General: No scleral icterus.    Extraocular Movements: EOM normal.     Conjunctiva/sclera: Conjunctivae normal.  Cardiovascular:     Rate and Rhythm: Normal rate and regular rhythm.     Heart sounds: No murmur heard.   Pulmonary:     Effort: Pulmonary effort is normal. No respiratory distress.     Breath sounds: Normal breath sounds. No wheezing.  Abdominal:     General: Bowel sounds are normal. There is no distension.     Palpations: Abdomen is soft. There is no mass.     Tenderness: There is no abdominal tenderness. There is no guarding or rebound.  Musculoskeletal:        General: No edema. Normal range of motion.     Cervical back: Neck supple.  Lymphadenopathy:     Cervical: No cervical adenopathy.  Skin:    General: Skin is warm and dry.     Capillary Refill: Capillary refill takes less than 2 seconds.  Neurological:     Mental Status: She is alert and oriented to person, place, and time.     Deep Tendon Reflexes: Strength normal. Reflexes normal.  Psychiatric:        Mood and Affect: Mood and affect normal.        Behavior: Behavior normal.  Comments: Tapping foot during entire visit     Results:  PHQ-9:  [place] Visit from 08/22/2019 in [place] at [place]  PHQ-9 Total Score 10         Assessment: 50 y.o. No obstetric history on file. female here for routine annual physical examination.  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 Willing to try increasing wellbutrin 150 mg >300 mg. Return 1 month      Relevant Medications   buPROPion (WELLBUTRIN XL) 300 MG 24 hr tablet   ADD (attention deficit disorder)    Has been off medication for 3 years. Discussed in setting of worsening depression recommend increased wellbutrin and return in 1 month if not improving       Other Visit Diagnoses    Encounter for routine adult physical exam with abnormal findings    -  Primary   Relevant Orders   Lipid panel   Screening for colon cancer       Relevant Orders   Cologuard   Need for hepatitis C screening test       Relevant Orders   Hepatitis C antibody   Encounter for screening for HIV       Relevant Orders   HIV Antibody (routine testing w rflx)      Screening: -- Blood pressure screen normal -- cholesterol screening: will obtain -- Weight screening: normal -- Diabetes Screening: not due for screening -- Nutrition: Encouraged healthy diet  The 10-year ASCVD risk score Mikey Bussing DC Jr., et al., 2013) is: 1.1%   Values used to calculate the score:     Age: 39 years     Sex: Female     Is Non-Hispanic African American: No     Diabetic: No     Tobacco smoker: No     Systolic Blood Pressure: 144 mmHg     Is BP treated: No     HDL Cholesterol: 70.9 mg/dL     Total Cholesterol: 241 mg/dL  -- Statin therapy for Age 23-75 with CVD risk >7.5%  Psych -- Depression screening (PHQ-9):  [place] Visit from 08/22/2019 in [place] at [place]  PHQ-9 Total Score 10       Safety -- tobacco screening: not using --  alcohol screening:  low-risk usage. -- no evidence of domestic violence or intimate partner violence.   Cancer Screening -- pap smear declined today - will reschedule -- family history of breast cancer screening: done. not at high risk. -- Mammogram - up to date -- Colon cancer (age 58+)-- cologuard  Immunizations Immunization History  Administered Date(s) Administered  . Influenza-Unspecified 05/22/2014  . Tdap 04/16/2016    -- flu vaccine declined -- TDAP q10 years up to date -- Covid-19 Vaccine declined   Encouraged healthy diet and exercise. Encouraged regular vision and dental care.    Lesleigh Noe, MD

## 2020-09-11 NOTE — Patient Instructions (Addendum)
Increase wellbutrin  If working, great!  If not improving return in 1 month for adderall discussion  Get information about the cologuard in the mail

## 2020-09-12 LAB — LIPID PANEL
Cholesterol: 277 mg/dL — ABNORMAL HIGH (ref 0–200)
HDL: 67 mg/dL (ref >39.00)
NonHDL: 210.27
Total CHOL/HDL Ratio: 4
Triglycerides: 264 mg/dL — ABNORMAL HIGH (ref 0.0–149.0)
VLDL: 52.8 mg/dL — ABNORMAL HIGH (ref 0.0–40.0)

## 2020-09-12 LAB — TSH: TSH: 4.42 u[IU]/mL (ref 0.35–4.50)

## 2020-09-12 LAB — HEPATITIS C ANTIBODY
Hepatitis C Ab: NONREACTIVE
SIGNAL TO CUT-OFF: 0.01 (ref <1.00)

## 2020-09-12 LAB — HIV ANTIBODY (ROUTINE TESTING W REFLEX): HIV 1&2 Ab, 4th Generation: NONREACTIVE

## 2020-09-12 LAB — LDL CHOLESTEROL, DIRECT: Direct LDL: 185 mg/dL

## 2020-10-01 LAB — COLOGUARD: Cologuard: NEGATIVE

## 2020-10-06 ENCOUNTER — Other Ambulatory Visit: Payer: Self-pay | Admitting: Family Medicine

## 2020-10-06 DIAGNOSIS — E890 Postprocedural hypothyroidism: Secondary | ICD-10-CM

## 2020-10-15 ENCOUNTER — Encounter: Payer: Self-pay | Admitting: Family Medicine

## 2020-10-15 ENCOUNTER — Telehealth: Payer: Self-pay

## 2020-10-15 NOTE — Telephone Encounter (Signed)
Spoke to pt and relayed to her that her cologuard results are negative.

## 2021-01-20 ENCOUNTER — Encounter: Payer: Self-pay | Admitting: Family Medicine

## 2021-01-20 ENCOUNTER — Ambulatory Visit (INDEPENDENT_AMBULATORY_CARE_PROVIDER_SITE_OTHER): Payer: PRIVATE HEALTH INSURANCE | Admitting: Family Medicine

## 2021-01-20 ENCOUNTER — Other Ambulatory Visit: Payer: Self-pay

## 2021-01-20 VITALS — BP 160/90 | HR 82 | Temp 98.2°F | Ht 60.0 in | Wt 141.2 lb

## 2021-01-20 DIAGNOSIS — I700 Atherosclerosis of aorta: Secondary | ICD-10-CM | POA: Diagnosis not present

## 2021-01-20 DIAGNOSIS — F900 Attention-deficit hyperactivity disorder, predominantly inattentive type: Secondary | ICD-10-CM

## 2021-01-20 DIAGNOSIS — Z808 Family history of malignant neoplasm of other organs or systems: Secondary | ICD-10-CM | POA: Diagnosis not present

## 2021-01-20 DIAGNOSIS — E782 Mixed hyperlipidemia: Secondary | ICD-10-CM | POA: Diagnosis not present

## 2021-01-20 DIAGNOSIS — L989 Disorder of the skin and subcutaneous tissue, unspecified: Secondary | ICD-10-CM | POA: Insufficient documentation

## 2021-01-20 MED ORDER — ATORVASTATIN CALCIUM 10 MG PO TABS: 10.0000 mg | ORAL_TABLET | Freq: Every day | ORAL | 3 refills | Status: DC

## 2021-01-20 NOTE — Patient Instructions (Addendum)
#  ADHD - make 1 month follow-up appointment - bring blood pressure monitoring - will plan if bp is controlled to consider adderall    #skin check - for skin check  #Referral I have placed a referral to a specialist for you. You should receive a phone call from the specialty office. Make sure your voicemail is not full and that if you are able to answer your phone to unknown or new numbers.   It may take up to 2 weeks to hear about the referral. If you do not hear anything in 2 weeks, please call our office and ask to speak with the referral coordinator.   #Blood pressure - check at home - return in 1 month  To check your blood pressure 1) Sit in a quiet and relaxed place for 5 minutes 2) Make sure your feet are flat on the ground 3) Consider checking first thing in the morning   Normal blood pressure is less than 140/90 Ideally you blood pressure should be around 120/80  Other ways you can reduce your blood pressure:  1) Regular exercise -- Try to get 150 minutes (30 minutes, 5 days a week) of moderate to vigorous aerobic excercise -- Examples: brisk walking (2.5 miles per hour), water aerobics, dancing, gardening, tennis, biking slower than 10 miles per hour 2) DASH Diet - low fat meats, more fresh fruits and vegetables, whole grains, low salt 3) Quit smoking if you smoke 4) Loose 5-10% of your body weight

## 2021-01-20 NOTE — Assessment & Plan Note (Signed)
Start atorvastatin 10 mg. Repeat lipids in 3 months

## 2021-01-20 NOTE — Assessment & Plan Note (Signed)
Pt notes no improvement with increase in Wellbutrin. Would like to go back on adderall. Discussed with bp high today would recommend f/u appointment and if bp improves could consider adderall refill.

## 2021-01-20 NOTE — Progress Notes (Signed)
Subjective:     Renee [name] is a 50 y.o. female presenting for medication discussion (For cholesterol ) and Skin Discoloration (On R forearm )     HPI  #Atherosclerosis  - will start statin medication  #Skin lesion - forearm - noticed about 6 months ago - is increasing in size -   #ADHD - increased wellbutrin 300 mg at last visit - does not feel this has helped - has to write everything down - denies depression - was on adderall in the past with success   Review of Systems   Social History   Tobacco Use  Smoking Status Former   Packs/day: 0.50   Years: 14.00   Pack years: 7.00   Types: Cigarettes   Quit date: 09/22/2010   Years since quitting: 10.3  Smokeless Tobacco Never        Objective:    BP Readings from Last 3 Encounters:  01/20/21 (!) 160/90  09/11/20 126/72  11/21/19 124/66   Wt Readings from Last 3 Encounters:  01/20/21 141 lb 4 oz (64.1 kg)  09/11/20 147 lb 12.8 oz (67 kg)  01/25/20 125 lb (56.7 kg)    BP (!) 160/90   Pulse 82   Temp 98.2 F (36.8 C) (Temporal)   Ht 5' (1.524 m)   Wt 141 lb 4 oz (64.1 kg)   SpO2 97%   BMI 27.59 kg/m    Physical Exam Constitutional:      General: She is not in acute distress.    Appearance: She is well-developed. She is not diaphoretic.  HENT:     Right Ear: External ear normal.     Left Ear: External ear normal.     Nose: Nose normal.  Eyes:     Conjunctiva/sclera: Conjunctivae normal.  Cardiovascular:     Rate and Rhythm: Normal rate and regular rhythm.     Heart sounds: No murmur heard. Pulmonary:     Effort: Pulmonary effort is normal. No respiratory distress.     Breath sounds: Normal breath sounds. No wheezing.  Musculoskeletal:     Cervical back: Neck supple.  Skin:    General: Skin is warm and dry.     Capillary Refill: Capillary refill takes less than 2 seconds.     Comments: Hyperpigmented stuck on lesion on the right forearm  Neurological:     Mental Status: She is  alert. Mental status is at baseline.  Psychiatric:        Mood and Affect: Mood normal.        Behavior: Behavior normal.     The 10-year ASCVD risk score Renee Bussing DC Jr., et al., 2013) is: 2.5%   Values used to calculate the score:     Age: 59 years     Sex: Female     Is Non-Hispanic African American: No     Diabetic: No     Tobacco smoker: No     Systolic Blood Pressure: 782 mmHg     Is BP treated: No     HDL Cholesterol: 67 mg/dL     Total Cholesterol: 277 mg/dL      Assessment & Plan:   Problem List Items Addressed This Visit       Cardiovascular and Mediastinum   Atherosclerosis of aorta ([place]) - Primary    Pt willing to start atorvastatin 10 mg and asa 81 mg for prevention.        Relevant Medications   atorvastatin (LIPITOR) 10 MG tablet  Other Relevant Orders   Lipid panel     Musculoskeletal and Integument   Skin lesion    Suspect seborrheic keratosis - reassurance provided. But given fmhx of melanoma in father advised dermatology appointment for skin exam.          Other   Hyperlipidemia    Start atorvastatin 10 mg. Repeat lipids in 3 months       Relevant Medications   atorvastatin (LIPITOR) 10 MG tablet   ADD (attention deficit disorder)    Pt notes no improvement with increase in Wellbutrin. Would like to go back on adderall. Discussed with bp high today would recommend f/u appointment and if bp improves could consider adderall refill.        Other Visit Diagnoses     Family history of melanoma       Relevant Orders   Ambulatory referral to Dermatology        Return in about 4 weeks (around 02/17/2021) for bp and adhd.  Renee Noe, MD  This visit occurred during the SARS-CoV-2 public health emergency.  Safety protocols were in place, including screening questions prior to the visit, additional usage of staff PPE, and extensive cleaning of exam room while observing appropriate contact time as indicated for disinfecting solutions.

## 2021-01-20 NOTE — Assessment & Plan Note (Signed)
Pt willing to start atorvastatin 10 mg and asa 81 mg for prevention.

## 2021-01-20 NOTE — Assessment & Plan Note (Signed)
Suspect seborrheic keratosis - reassurance provided. But given fmhx of melanoma in father advised dermatology appointment for skin exam.

## 2021-02-17 ENCOUNTER — Ambulatory Visit (INDEPENDENT_AMBULATORY_CARE_PROVIDER_SITE_OTHER): Payer: PRIVATE HEALTH INSURANCE | Admitting: Family Medicine

## 2021-02-17 ENCOUNTER — Other Ambulatory Visit: Payer: Self-pay

## 2021-02-17 VITALS — BP 128/90 | HR 87 | Temp 98.3°F | Ht 60.0 in | Wt 143.0 lb

## 2021-02-17 DIAGNOSIS — E782 Mixed hyperlipidemia: Secondary | ICD-10-CM | POA: Diagnosis not present

## 2021-02-17 DIAGNOSIS — F900 Attention-deficit hyperactivity disorder, predominantly inattentive type: Secondary | ICD-10-CM | POA: Diagnosis not present

## 2021-02-17 MED ORDER — AMPHETAMINE-DEXTROAMPHETAMINE 10 MG PO TABS: 10.0000 mg | ORAL_TABLET | Freq: Every day | ORAL | 0 refills | Status: DC

## 2021-02-17 NOTE — Assessment & Plan Note (Signed)
Poorly controlled. BP improved so discussed starting medication. Will start with Adderall 10 mg (previous dose adderall 20 mg). Mychart in 3 weeks with update for dose adjustment as needed. Return 3 months. UDS today. Will plan contract once on stable dose.

## 2021-02-17 NOTE — Progress Notes (Signed)
   Subjective:     RANDY WHITENER is a 50 y.o. 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  #ADHD - was on adderall 10 mg and this was increased to 20 mg - was never on long acting but not sure why - does not want to restart at 20, would like to ease in - denies other drug use  Review of Systems   Social History   Tobacco Use  Smoking Status Former   Packs/day: 0.50   Years: 14.00   Pack years: 7.00   Types: Cigarettes   Quit date: 09/22/2010   Years since quitting: 10.4  Smokeless Tobacco Never        Objective:    BP Readings from Last 3 Encounters:  02/17/21 128/90  01/20/21 (!) 160/90  09/11/20 126/72   Wt Readings from Last 3 Encounters:  02/17/21 143 lb (64.9 kg)  01/20/21 141 lb 4 oz (64.1 kg)  09/11/20 147 lb 12.8 oz (67 kg)    BP 128/90   Pulse 87   Temp 98.3 F (36.8 C) (Temporal)   Ht 5' (1.524 m)   Wt 143 lb (64.9 kg)   SpO2 98%   BMI 27.93 kg/m    Physical Exam Constitutional:      General: She is not in acute distress.    Appearance: She is well-developed. She is not diaphoretic.  HENT:     Right Ear: External ear normal.     Left Ear: External ear normal.     Nose: Nose normal.  Eyes:     Conjunctiva/sclera: Conjunctivae normal.  Cardiovascular:     Rate and Rhythm: Normal rate.  Pulmonary:     Effort: Pulmonary effort is normal.  Musculoskeletal:     Cervical back: Neck supple.  Skin:    General: Skin is warm and dry.     Capillary Refill: Capillary refill takes less than 2 seconds.  Neurological:     Mental Status: She is alert. Mental status is at baseline.  Psychiatric:        Mood and Affect: Mood normal.        Behavior: Behavior normal.          Assessment & Plan:   Problem List Items Addressed This Visit       Other   Hyperlipidemia    Taking lipitor 10 mg. Discussed this is typically a  lifelong treatment. Cont diet and exercise.        ADD (attention deficit disorder) - Primary    Poorly controlled. BP improved so discussed starting medication. Will start with Adderall 10 mg (previous dose adderall 20 mg). Mychart in 3 weeks with update for dose adjustment as needed. Return 3 months. UDS today. Will plan contract once on stable dose.        Relevant Orders   DRUG MONITORING, PANEL 8 WITH CONFIRMATION, URINE     Return in about 3 months (around 05/20/2021) for ADD.  Lesleigh Noe, MD  This visit occurred during the SARS-CoV-2 public health emergency.  Safety protocols were in place, including screening questions prior to the visit, additional usage of staff PPE, and extensive cleaning of exam room while observing appropriate contact time as indicated for disinfecting solutions.

## 2021-02-17 NOTE — Patient Instructions (Signed)
#  ADD - How long does the medication last -- does it wear off too early  >>> if wearing off, will likely consider long acting option - Is it effective -- if not would want to increase dose   Send Mychart message in 2-3 weeks with update   Will plan refills based on response

## 2021-02-17 NOTE — Assessment & Plan Note (Signed)
Taking lipitor 10 mg. Discussed this is typically a lifelong treatment. Cont diet and exercise.

## 2021-02-21 LAB — DM TEMPLATE

## 2021-02-21 LAB — DRUG MONITORING, PANEL 8 WITH CONFIRMATION, URINE
6 Acetylmorphine: NEGATIVE ng/mL (ref <10)
Alcohol Metabolites: POSITIVE ng/mL — AB (ref <500)
Amphetamines: NEGATIVE ng/mL (ref <500)
Benzodiazepines: NEGATIVE ng/mL (ref <100)
Buprenorphine, Urine: NEGATIVE ng/mL (ref <5)
Cocaine Metabolite: NEGATIVE ng/mL (ref <150)
Creatinine: 118.5 mg/dL (ref >=20.0)
Ethyl Glucuronide (ETG): >10000 ng/mL — ABNORMAL HIGH (ref <500)
Ethyl Sulfate (ETS): >10000 ng/mL — ABNORMAL HIGH (ref <100)
MDMA: NEGATIVE ng/mL (ref <500)
Marijuana Metabolite: NEGATIVE ng/mL (ref <20)
Opiates: NEGATIVE ng/mL (ref <100)
Oxidant: NEGATIVE ug/mL (ref <200)
Oxycodone: NEGATIVE ng/mL (ref <100)
pH: 7 (ref 4.5–9.0)

## 2021-02-24 ENCOUNTER — Encounter: Payer: Self-pay | Admitting: Family Medicine

## 2021-03-10 MED ORDER — AMPHETAMINE-DEXTROAMPHETAMINE 20 MG PO TABS: 20.0000 mg | ORAL_TABLET | Freq: Every day | ORAL | 0 refills | Status: DC

## 2021-03-10 NOTE — Addendum Note (Signed)
Addended by: Lesleigh Noe on: 03/10/2021 05:23 PM   Modules accepted: Orders

## 2021-04-14 ENCOUNTER — Other Ambulatory Visit: Payer: Self-pay | Admitting: Family Medicine

## 2021-04-14 DIAGNOSIS — E890 Postprocedural hypothyroidism: Secondary | ICD-10-CM

## 2021-04-20 ENCOUNTER — Other Ambulatory Visit: Payer: Self-pay | Admitting: Family Medicine

## 2021-04-22 NOTE — Telephone Encounter (Signed)
Pt called in to know status on refill .

## 2021-04-22 NOTE — Telephone Encounter (Signed)
Mychart message sent to pt asking how dosage is doing?

## 2021-04-23 MED ORDER — AMPHETAMINE-DEXTROAMPHETAMINE 20 MG PO TABS: 20.0000 mg | ORAL_TABLET | Freq: Every day | ORAL | 0 refills | Status: DC

## 2021-04-27 ENCOUNTER — Other Ambulatory Visit: Payer: PRIVATE HEALTH INSURANCE

## 2021-05-25 ENCOUNTER — Other Ambulatory Visit: Payer: Self-pay | Admitting: Family Medicine

## 2021-05-25 ENCOUNTER — Other Ambulatory Visit: Payer: PRIVATE HEALTH INSURANCE

## 2021-05-25 NOTE — Telephone Encounter (Signed)
  Encourage patient to contact the pharmacy for refills or they can request refills through [place]:  Please schedule appointment if longer than 1 year  NEXT APPOINTMENT DATE:  MEDICATION:adderall  Is the patient out of medication? yes  [facility] [address] [employer]  Let patient know to contact pharmacy at the end of the day to make sure medication is ready.  Please notify patient to allow 48-72 hours to process  CLINICAL FILLS OUT ALL BELOW:   LAST REFILL:  QTY:  REFILL DATE:    OTHER COMMENTS:    Okay for refill?  Please advise

## 2021-05-25 NOTE — Telephone Encounter (Signed)
Name of Medication: Adderall 20 mg Name of Pharmacy: [facility] [place] or Written Date and Quantity: # 30 on 04/23/21 Last Office Visit and Type: 02/17/21 FU Next Office Visit and Type: none scheduled Last Controlled Substance Agreement Date: 07/19/2018 Last UDS:02/17/21   [facility]; I [facility] find [facility] on [address] [place] so I am putting [facility] since pt has been using that pharmacy.

## 2021-05-25 NOTE — Telephone Encounter (Signed)
Pt is due for her 3 month f/u appointment

## 2021-05-26 MED ORDER — AMPHETAMINE-DEXTROAMPHETAMINE 20 MG PO TABS: 20.0000 mg | ORAL_TABLET | Freq: Every day | ORAL | 0 refills | Status: DC

## 2021-05-26 NOTE — Telephone Encounter (Signed)
Spoke with pt scheduled follow up appointment

## 2021-05-26 NOTE — Addendum Note (Signed)
Addended by: Helene Shoe on: 05/26/2021 11:43 AM   Modules accepted: Orders

## 2021-06-04 ENCOUNTER — Other Ambulatory Visit: Payer: Self-pay

## 2021-06-04 ENCOUNTER — Ambulatory Visit (INDEPENDENT_AMBULATORY_CARE_PROVIDER_SITE_OTHER): Payer: PRIVATE HEALTH INSURANCE | Admitting: Family Medicine

## 2021-06-04 VITALS — BP 156/98 | HR 83 | Temp 96.0°F | Ht 60.0 in | Wt 143.1 lb

## 2021-06-04 DIAGNOSIS — J069 Acute upper respiratory infection, unspecified: Secondary | ICD-10-CM

## 2021-06-04 DIAGNOSIS — R030 Elevated blood-pressure reading, without diagnosis of hypertension: Secondary | ICD-10-CM | POA: Diagnosis not present

## 2021-06-04 DIAGNOSIS — F900 Attention-deficit hyperactivity disorder, predominantly inattentive type: Secondary | ICD-10-CM

## 2021-06-04 MED ORDER — AMPHETAMINE-DEXTROAMPHETAMINE 20 MG PO TABS: 20.0000 mg | ORAL_TABLET | Freq: Every day | ORAL | 0 refills | Status: DC

## 2021-06-04 NOTE — Patient Instructions (Addendum)
Blood pressure - Check once a week - bring log to next appointment - if >150/100, make an appointment  ADHD - return in 4 months for visit   Based on your symptoms, it looks like you have a virus.   Antibiotics are not need for a viral infection but the following will help:   Drink plenty of fluids Get lots of rest  Sinus Congestion 1) Neti Pot (Saline rinse) -- 2 times day -- if tolerated 2) Flonase (Store Brand ok) - once daily 3) Over the counter congestion medications  Cough 1) Cough drops can be helpful 2) Nyquil (or nighttime cough medication) 3) Honey is proven to be one of the best cough medications  4) Cough medicine with Dextromethorphan can also be helpful  Sore Throat 1) Honey as above, cough drops 2) Ibuprofen or Aleve can be helpful 3) Salt water Gargles  If you develop fevers (Temperature >100.4), chills, worsening symptoms or symptoms lasting longer than 10 days return to clinic.

## 2021-06-04 NOTE — Assessment & Plan Note (Signed)
Doing well. UDS appropriate. Contract today. Cont Adderall 20 mg daily. Return 4 months.

## 2021-06-04 NOTE — Assessment & Plan Note (Signed)
Pt reports normal bp at work and home. She will bring log to next visit. She will also update if elevated home. Discussed adderall risk for elevated bp and would need to consider medication

## 2021-06-04 NOTE — Progress Notes (Signed)
Subjective:     Renee [name] is a 50 y.o. female presenting for Follow-up (3 mo- ADHD), Sore Throat (X 2 days ), Nasal Congestion, and Cough (From drainage )     Sore Throat  Associated symptoms include coughing.  Cough   #ADHD - doing well on adderall  - does not wear off  - sleeping ok - appetite is good  #HTN - does check at home - it is low - 110/70 - did have one elevated and then afterward it lowered  - no cp, - does have HA and SOB - with recent infection  #Sinus congestion - sore throat - cough - started 2 days - felt worse yesterday than today - did not get a flu - no body aches or fevers - taking: nyquil at night, occasional ibuprofen - also with headache - no n/v/diarrhea   Review of Systems  Respiratory:  Positive for cough.     Social History   Tobacco Use  Smoking Status Former   Packs/day: 0.50   Years: 14.00   Pack years: 7.00   Types: Cigarettes   Quit date: 09/22/2010   Years since quitting: 10.7  Smokeless Tobacco Never        Objective:    BP Readings from Last 3 Encounters:  06/04/21 (!) 156/98  02/17/21 128/90  01/20/21 (!) 160/90   Wt Readings from Last 3 Encounters:  06/04/21 143 lb 2 oz (64.9 kg)  02/17/21 143 lb (64.9 kg)  01/20/21 141 lb 4 oz (64.1 kg)    BP (!) 156/98   Pulse 83   Temp (!) 96 F (35.6 C) (Temporal)   Ht 5' (1.524 m)   Wt 143 lb 2 oz (64.9 kg)   SpO2 98%   BMI 27.95 kg/m    Physical Exam Constitutional:      General: She is not in acute distress.    Appearance: She is well-developed. She is not diaphoretic.  HENT:     Head: Normocephalic and atraumatic.     Right Ear: Tympanic membrane and ear canal normal.     Left Ear: Tympanic membrane and ear canal normal.     Nose: Mucosal edema and rhinorrhea present.     Right Sinus: No maxillary sinus tenderness or frontal sinus tenderness.     Left Sinus: No maxillary sinus tenderness or frontal sinus tenderness.     Mouth/Throat:      Pharynx: Uvula midline. Posterior oropharyngeal erythema present. No oropharyngeal exudate.     Tonsils: 0 on the right. 0 on the left.  Eyes:     General: No scleral icterus.    Conjunctiva/sclera: Conjunctivae normal.  Cardiovascular:     Rate and Rhythm: Normal rate and regular rhythm.     Heart sounds: Normal heart sounds. No murmur heard. Pulmonary:     Effort: Pulmonary effort is normal. No respiratory distress.     Breath sounds: Normal breath sounds.  Musculoskeletal:     Cervical back: Neck supple.  Lymphadenopathy:     Cervical: No cervical adenopathy.  Skin:    General: Skin is warm and dry.     Capillary Refill: Capillary refill takes less than 2 seconds.  Neurological:     Mental Status: She is alert.          Assessment & Plan:   Problem List Items Addressed This Visit       Cardiovascular and Mediastinum   White coat syndrome without diagnosis of hypertension  Pt reports normal bp at work and home. She will bring log to next visit. She will also update if elevated home. Discussed adderall risk for elevated bp and would need to consider medication        Other   ADD (attention deficit disorder) - Primary    Doing well. UDS appropriate. Contract today. Cont Adderall 20 mg daily. Return 4 months.       Relevant Medications   amphetamine-dextroamphetamine (ADDERALL) 20 MG tablet   amphetamine-dextroamphetamine (ADDERALL) 20 MG tablet (Start on 09/01/2021)   amphetamine-dextroamphetamine (ADDERALL) 20 MG tablet (Start on 07/03/2021)   amphetamine-dextroamphetamine (ADDERALL) 20 MG tablet (Start on 07/31/2021)   Other Visit Diagnoses     Viral URI with cough          Discussed viral syndrome and symptomatic care Update if worsening/not improved next week and could consider abx  Return in about 4 months (around 10/05/2021).  Lesleigh Noe, MD  This visit occurred during the SARS-CoV-2 public health emergency.  Safety protocols were in place,  including screening questions prior to the visit, additional usage of staff PPE, and extensive cleaning of exam room while observing appropriate contact time as indicated for disinfecting solutions.

## 2021-07-10 ENCOUNTER — Telehealth: Payer: Self-pay | Admitting: Family Medicine

## 2021-07-10 DIAGNOSIS — F331 Major depressive disorder, recurrent, moderate: Secondary | ICD-10-CM

## 2021-07-13 ENCOUNTER — Encounter: Payer: Self-pay | Admitting: Family Medicine

## 2021-07-13 NOTE — Telephone Encounter (Signed)
Sent letter via my chart.

## 2021-07-13 NOTE — Telephone Encounter (Signed)
Called patient [email or web address]

## 2021-07-14 NOTE — Telephone Encounter (Signed)
Called patient left vm

## 2021-10-25 ENCOUNTER — Other Ambulatory Visit: Payer: Self-pay | Admitting: Family Medicine

## 2021-10-25 DIAGNOSIS — F331 Major depressive disorder, recurrent, moderate: Secondary | ICD-10-CM

## 2021-11-17 ENCOUNTER — Other Ambulatory Visit: Payer: Self-pay | Admitting: Family Medicine

## 2021-11-17 DIAGNOSIS — E890 Postprocedural hypothyroidism: Secondary | ICD-10-CM

## 2021-11-20 ENCOUNTER — Ambulatory Visit (INDEPENDENT_AMBULATORY_CARE_PROVIDER_SITE_OTHER): Payer: PRIVATE HEALTH INSURANCE | Admitting: Family Medicine

## 2021-11-20 VITALS — BP 130/80 | HR 78 | Temp 97.9°F | Ht 60.0 in | Wt 152.2 lb

## 2021-11-20 DIAGNOSIS — F900 Attention-deficit hyperactivity disorder, predominantly inattentive type: Secondary | ICD-10-CM | POA: Diagnosis not present

## 2021-11-20 DIAGNOSIS — E890 Postprocedural hypothyroidism: Secondary | ICD-10-CM | POA: Diagnosis not present

## 2021-11-20 DIAGNOSIS — F324 Major depressive disorder, single episode, in partial remission: Secondary | ICD-10-CM

## 2021-11-20 DIAGNOSIS — I700 Atherosclerosis of aorta: Secondary | ICD-10-CM

## 2021-11-20 LAB — COMPREHENSIVE METABOLIC PANEL
ALT: 18 U/L (ref 0–35)
AST: 30 U/L (ref 0–37)
Albumin: 4.3 g/dL (ref 3.5–5.2)
Alkaline Phosphatase: 87 U/L (ref 39–117)
BUN: 17 mg/dL (ref 6–23)
CO2: 30 mEq/L (ref 19–32)
Calcium: 9.1 mg/dL (ref 8.4–10.5)
Chloride: 100 mEq/L (ref 96–112)
Creatinine, Ser: 0.8 mg/dL (ref 0.40–1.20)
GFR: 84.88 mL/min (ref >60.00)
Glucose, Bld: 89 mg/dL (ref 70–99)
Potassium: 4.3 mEq/L (ref 3.5–5.1)
Sodium: 137 mEq/L (ref 135–145)
Total Bilirubin: 0.6 mg/dL (ref 0.2–1.2)
Total Protein: 6.6 g/dL (ref 6.0–8.3)

## 2021-11-20 LAB — LIPID PANEL
Cholesterol: 291 mg/dL — ABNORMAL HIGH (ref 0–200)
HDL: 86.1 mg/dL (ref >39.00)
LDL Cholesterol: 181 mg/dL — ABNORMAL HIGH (ref 0–99)
NonHDL: 204.44
Total CHOL/HDL Ratio: 3
Triglycerides: 117 mg/dL (ref 0.0–149.0)
VLDL: 23.4 mg/dL (ref 0.0–40.0)

## 2021-11-20 LAB — CBC
HCT: 41.1 % (ref 36.0–46.0)
Hemoglobin: 13.8 g/dL (ref 12.0–15.0)
MCHC: 33.7 g/dL (ref 30.0–36.0)
MCV: 106.3 fl — ABNORMAL HIGH (ref 78.0–100.0)
Platelets: 196 10*3/uL (ref 150.0–400.0)
RBC: 3.86 Mil/uL — ABNORMAL LOW (ref 3.87–5.11)
RDW: 14 % (ref 11.5–15.5)
WBC: 6.6 10*3/uL (ref 4.0–10.5)

## 2021-11-20 LAB — TSH: TSH: 21.09 u[IU]/mL — ABNORMAL HIGH (ref 0.35–5.50)

## 2021-11-20 MED ORDER — AMPHETAMINE-DEXTROAMPHET ER 20 MG PO CP24: 20.0000 mg | ORAL_CAPSULE | ORAL | 0 refills | Status: DC

## 2021-11-20 NOTE — Assessment & Plan Note (Signed)
Continue atorvastatin 10 mg.  Advised starting aspirin 81 mg. ?

## 2021-11-20 NOTE — Assessment & Plan Note (Signed)
Stable, still dealing with grief. Cont Wellbutrin 300 mg daily ?

## 2021-11-20 NOTE — Assessment & Plan Note (Signed)
Do suspect she may have some adherence difficulty due to forgetfulness.  Also notes wearing off throughout the day.  Discussed switching to long-acting Adderall 20 mg sent to pharmacy.  Plan to refill for 2 additional months if effective. ?

## 2021-11-20 NOTE — Assessment & Plan Note (Signed)
Weight gain, will recheck TSH today.  Continue levothyroxine 100 mcg. ?

## 2021-11-20 NOTE — Patient Instructions (Addendum)
ADHD ?- switch to long acting ?- update if effective - and will send in 2 more months ? ?Consider starting a daily aspirin 81 mg ? ? ?Would recommend the following Bone health:  ? ?1) 800 units of Vitamin D daily ?2) Get 1200 mg of elemental calcium --- this is best from your diet. Try to track how much calcium you get on a typical day. You could find ways to add more (dairy products, leafy greens). Take a supplement for whatever you don't typically get so you reach 1200 mg of calcium.  ?3) Physical activity (ideally weight bearing) - like walking briskly 30 minutes 5 days a week.  ? ?Schedule Mammogram at [facility] ? ? ?

## 2021-11-20 NOTE — Progress Notes (Signed)
? ?Subjective:  ? ?  ?Renee [name] is a 51 y.o. female presenting for Medication Refill ?  ? ? ?HPI ? ?#ADHD ?- occassional forgets ?- taking it every day ?- feels it is helpful ?- wears off early ?- works well at first ?- no issues with insomnia ? ?#thyroid ?- taking daily ?- occasionally forgetting ?- has gained weight recently ?- eating late and working more ? ?Coffee with cream ? ? ?Review of Systems ? ? ?Social History  ? ?Tobacco Use  ?Smoking Status Former  ? Packs/day: 0.50  ? Years: 14.00  ? Pack years: 7.00  ? Types: Cigarettes  ? Quit date: 09/22/2010  ? Years since quitting: 11.1  ?Smokeless Tobacco Never  ? ? ? ?   ?Objective:  ?  ?BP Readings from Last 3 Encounters:  ?11/20/21 130/80  ?06/04/21 (!) 156/98  ?02/17/21 128/90  ? ?Wt Readings from Last 3 Encounters:  ?11/20/21 152 lb 4 oz (69.1 kg)  ?06/04/21 143 lb 2 oz (64.9 kg)  ?02/17/21 143 lb (64.9 kg)  ? ? ?BP 130/80   Pulse 78   Temp 97.9 ?F (36.6 ?C) (Oral)   Ht 5' (1.524 m)   Wt 152 lb 4 oz (69.1 kg)   SpO2 99%   BMI 29.73 kg/m?  ? ? ?Physical Exam ?Constitutional:   ?   General: She is not in acute distress. ?   Appearance: She is well-developed. She is not diaphoretic.  ?HENT:  ?   Right Ear: External ear normal.  ?   Left Ear: External ear normal.  ?   Nose: Nose normal.  ?Eyes:  ?   Conjunctiva/sclera: Conjunctivae normal.  ?Cardiovascular:  ?   Rate and Rhythm: Normal rate and regular rhythm.  ?Pulmonary:  ?   Effort: Pulmonary effort is normal. No respiratory distress.  ?   Breath sounds: Normal breath sounds. No wheezing.  ?Musculoskeletal:  ?   Cervical back: Neck supple.  ?Skin: ?   General: Skin is warm and dry.  ?   Capillary Refill: Capillary refill takes less than 2 seconds.  ?Neurological:  ?   Mental Status: She is alert. Mental status is at baseline.  ?Psychiatric:     ?   Mood and Affect: Mood normal.     ?   Behavior: Behavior normal.  ? ? ? ? ? ?   ?Assessment & Plan:  ? ?Problem List Items Addressed This Visit   ? ?  ?  Cardiovascular and Mediastinum  ? Atherosclerosis of aorta ([place])  ?  Continue atorvastatin 10 mg.  Advised starting aspirin 81 mg. ? ?  ?  ? Relevant Orders  ? Comprehensive metabolic panel  ? Lipid panel  ? CBC  ?  ? Endocrine  ? Hypothyroidism - Primary  ?  Weight gain, will recheck TSH today.  Continue levothyroxine 100 mcg. ? ?  ?  ? Relevant Orders  ? CBC  ? TSH  ?  ? Other  ? Depression, major, single episode, in partial remission ([place])  ?  Stable, still dealing with grief. Cont Wellbutrin 300 mg daily ? ?  ?  ? ADD (attention deficit disorder)  ?  Do suspect she may have some adherence difficulty due to forgetfulness.  Also notes wearing off throughout the day.  Discussed switching to long-acting Adderall 20 mg sent to pharmacy.  Plan to refill for 2 additional months if effective. ? ?  ?  ? Relevant Medications  ? amphetamine-dextroamphetamine (ADDERALL  XR) 20 MG 24 hr capsule  ? ? ? ?Return in about 3 months (around 02/19/2022) for  annual w/ pap. ? ?Lesleigh Noe, MD ? ? ? ?

## 2021-11-26 ENCOUNTER — Encounter: Payer: Self-pay | Admitting: Family Medicine

## 2021-11-26 DIAGNOSIS — F331 Major depressive disorder, recurrent, moderate: Secondary | ICD-10-CM

## 2021-11-26 DIAGNOSIS — F900 Attention-deficit hyperactivity disorder, predominantly inattentive type: Secondary | ICD-10-CM

## 2021-11-27 MED ORDER — AMPHETAMINE-DEXTROAMPHETAMINE 20 MG PO TABS: 20.0000 mg | ORAL_TABLET | Freq: Every day | ORAL | 0 refills | Status: DC

## 2021-12-24 ENCOUNTER — Other Ambulatory Visit: Payer: Self-pay | Admitting: Family Medicine

## 2021-12-24 DIAGNOSIS — E782 Mixed hyperlipidemia: Secondary | ICD-10-CM

## 2021-12-24 DIAGNOSIS — I700 Atherosclerosis of aorta: Secondary | ICD-10-CM

## 2021-12-28 MED ORDER — AMPHETAMINE-DEXTROAMPHETAMINE 20 MG PO TABS: 20.0000 mg | ORAL_TABLET | Freq: Every day | ORAL | 0 refills | Status: DC

## 2021-12-28 MED ORDER — BUPROPION HCL ER (XL) 300 MG PO TB24: 300.0000 mg | ORAL_TABLET | Freq: Every day | ORAL | 0 refills | Status: DC

## 2021-12-28 NOTE — Addendum Note (Signed)
Addended by: Loreen Freud on: 12/28/2021 09:38 AM   Modules accepted: Orders

## 2022-01-29 ENCOUNTER — Other Ambulatory Visit: Payer: Self-pay | Admitting: Family Medicine

## 2022-01-29 DIAGNOSIS — F331 Major depressive disorder, recurrent, moderate: Secondary | ICD-10-CM

## 2022-02-15 ENCOUNTER — Other Ambulatory Visit: Payer: Self-pay | Admitting: Family Medicine

## 2022-02-15 DIAGNOSIS — E890 Postprocedural hypothyroidism: Secondary | ICD-10-CM

## 2022-02-15 NOTE — Telephone Encounter (Signed)
Last office visit 11/20/21 for Medication refill.  Last refilled 11/18/21 for #90 with no refills.  Last TSH was high at 21.09 uIU/ml.  My chart message was sent to patient about missed doses but I don't thing patient replied.  CPE scheduled for 02/25/22.  Ok to refill?

## 2022-02-16 NOTE — Telephone Encounter (Signed)
Noted. It appears the patient was off of her medications for a period of time, this is according to the MyChart message sent previously.  Refill provided for levothyroxine 100 mcg tablets. She will see PCP soon for follow-up.

## 2022-02-19 ENCOUNTER — Other Ambulatory Visit: Payer: Self-pay | Admitting: Family Medicine

## 2022-02-19 DIAGNOSIS — F900 Attention-deficit hyperactivity disorder, predominantly inattentive type: Secondary | ICD-10-CM

## 2022-02-19 NOTE — Telephone Encounter (Signed)
Name of Medication: Adderall 20 mg Name of Pharmacy: [facility] or Written Date and Quantity: # 30 on 12/28/21 Last Office Visit and Type: 11/20/21 med refill Next Office Visit and Type: 02/25/2022 CPX Last Controlled Substance Agreement Date: 06/30/2021 Last UDS:02/17/2021

## 2022-02-22 MED ORDER — AMPHETAMINE-DEXTROAMPHETAMINE 20 MG PO TABS: 20.0000 mg | ORAL_TABLET | Freq: Every day | ORAL | 0 refills | Status: DC

## 2022-02-22 NOTE — Telephone Encounter (Signed)
Pt notified Adderall 20 mg refill sent to [facility] and pt voiced understanding and appreciated call. Nothing further needed.

## 2022-02-25 ENCOUNTER — Ambulatory Visit (INDEPENDENT_AMBULATORY_CARE_PROVIDER_SITE_OTHER): Payer: PRIVATE HEALTH INSURANCE | Admitting: Family Medicine

## 2022-02-25 ENCOUNTER — Other Ambulatory Visit (HOSPITAL_COMMUNITY)
Admission: RE | Admit: 2022-02-25 | Discharge: 2022-02-25 | Disposition: A | Discharge status: Home / Self Care | Payer: PRIVATE HEALTH INSURANCE | Source: Ambulatory Visit | Attending: Family Medicine | Admitting: Family Medicine

## 2022-02-25 VITALS — BP 118/78 | HR 80 | Temp 97.3°F | Ht 60.0 in | Wt 147.2 lb

## 2022-02-25 DIAGNOSIS — Z1231 Encounter for screening mammogram for malignant neoplasm of breast: Secondary | ICD-10-CM

## 2022-02-25 DIAGNOSIS — E782 Mixed hyperlipidemia: Secondary | ICD-10-CM

## 2022-02-25 DIAGNOSIS — F331 Major depressive disorder, recurrent, moderate: Secondary | ICD-10-CM | POA: Diagnosis not present

## 2022-02-25 DIAGNOSIS — F900 Attention-deficit hyperactivity disorder, predominantly inattentive type: Secondary | ICD-10-CM

## 2022-02-25 DIAGNOSIS — Z0000 Encounter for general adult medical examination without abnormal findings: Secondary | ICD-10-CM

## 2022-02-25 DIAGNOSIS — Z124 Encounter for screening for malignant neoplasm of cervix: Secondary | ICD-10-CM

## 2022-02-25 DIAGNOSIS — F324 Major depressive disorder, single episode, in partial remission: Secondary | ICD-10-CM

## 2022-02-25 DIAGNOSIS — E079 Disorder of thyroid, unspecified: Secondary | ICD-10-CM | POA: Diagnosis not present

## 2022-02-25 LAB — LIPID PANEL
Cholesterol: 235 mg/dL — ABNORMAL HIGH (ref 0–200)
HDL: 114.4 mg/dL (ref >39.00)
LDL Cholesterol: 97 mg/dL (ref 0–99)
NonHDL: 120.52
Total CHOL/HDL Ratio: 2
Triglycerides: 117 mg/dL (ref 0.0–149.0)
VLDL: 23.4 mg/dL (ref 0.0–40.0)

## 2022-02-25 LAB — TSH: TSH: 7.47 u[IU]/mL — ABNORMAL HIGH (ref 0.35–5.50)

## 2022-02-25 MED ORDER — BUPROPION HCL ER (XL) 300 MG PO TB24: 300.0000 mg | ORAL_TABLET | Freq: Every day | ORAL | 1 refills | Status: DC

## 2022-02-25 MED ORDER — AMPHETAMINE-DEXTROAMPHETAMINE 20 MG PO TABS: 20.0000 mg | ORAL_TABLET | Freq: Every day | ORAL | 0 refills | Status: DC

## 2022-02-25 NOTE — Assessment & Plan Note (Signed)
Controlled, continue Wellbutrin 30 mg daily

## 2022-02-25 NOTE — Assessment & Plan Note (Signed)
Stable, continue Adderall 20 mg daily.  We considered long-acting, but she was unable to get this filled at the pharmacy due to shortage.

## 2022-02-25 NOTE — Progress Notes (Signed)
Annual Exam   Chief Complaint:  Chief Complaint  Patient presents with   Annual Exam    No concerns. Orders for mammogram requested. Pt will go to [facility].    Gynecologic Exam    History of Present Illness:  [name] is a 51 y.o. No obstetric history on file. who LMP was No LMP recorded. Patient is postmenopausal., presents today for her annual examination.    #depression - doing well  #adhd - will be taking the 20 mg adderall  - cannot get the XR - at the pharmacy  Nutrition She does get adequate calcium and Vitamin D in her diet. Diet: could be better Exercise: not currently - walking     Social History   Tobacco Use  Smoking Status Former   Packs/day: 0.50   Years: 14.00   Total pack years: 7.00   Types: Cigarettes   Quit date: 09/22/2010   Years since quitting: 11.4  Smokeless Tobacco Never   Social History   Substance and Sexual Activity  Alcohol Use Yes   Comment: rare use   Social History   Substance and Sexual Activity  Drug Use No     General Health Dentist in the last year: No Eye doctor: yes  Safety The patient wears seatbelts: yes.     The patient feels safe at home and in their relationships: yes.   Menstrual:  Symptoms of menopause: no issues currently  GYN She is not sexually active.    Cervical Cancer Screening (21-65):   Last Pap:  not sure - 2016  Breast Cancer Screening (Age 72-74):  There is personal history of breast cancer. There is no FH of ovarian cancer. BRCA screening negative.  Last Mammogram: 07/14/2020 The patient does want a mammogram this year.    Colon Cancer Screening:  Age 59-75 yo - benefits outweigh the risk. Adults 27-85 yo who have never been screened benefit.  Benefits: 134000 people in 2016 will be diagnosed and 49,000 will die - early detection helps [place]: Complications 2/2 to colonoscopy High Risk (Colonoscopy): genetic disorder (Lynch syndrome or familial adenomatous polyposis),  personal hx of IBD, previous adenomatous polyp, or previous colorectal cancer, FamHx start 10 years before the age at diagnosis, increased in males and black race  Options:  FIT - looks for hemoglobin (blood in the stool) - specific and fairly sensitive - must be done annually Cologuard - looks for DNA and blood - more sensitive - therefore can have more false positives, every 3 years Colonoscopy - every 10 years if normal - sedation, bowl prep, must have someone drive you  Shared decision making and the patient had decided to do cologuard - 2022.   Social History   Tobacco Use  Smoking Status Former   Packs/day: 0.50   Years: 14.00   Total pack years: 7.00   Types: Cigarettes   Quit date: 09/22/2010   Years since quitting: 11.4  Smokeless Tobacco Never    Lung Cancer Screening (Ages 54-49): not applicable   Weight Wt Readings from Last 3 Encounters:  02/25/22 147 lb 4 oz (66.8 kg)  11/20/21 152 lb 4 oz (69.1 kg)  06/04/21 143 lb 2 oz (64.9 kg)   Patient has high BMI  BMI Readings from Last 1 Encounters:  02/25/22 28.76 kg/m     Chronic disease screening Blood pressure monitoring:  BP Readings from Last 3 Encounters:  02/25/22 118/78  11/20/21 130/80  06/04/21 (!) 156/98    Lipid  Monitoring: Indication for screening: age >47, obesity, diabetes, family hx, CV risk factors.  Lipid screening: Yes  Lab Results  Component Value Date   CHOL 291 (H) 11/20/2021   HDL 86.10 11/20/2021   LDLCALC 181 (H) 11/20/2021   LDLDIRECT 185.0 09/11/2020   TRIG 117.0 11/20/2021   CHOLHDL 3 11/20/2021     Diabetes Screening: age >1, overweight, family hx, PCOS, hx of gestational diabetes, at risk ethnicity Diabetes Screening screening: Yes  No results found for: "HGBA1C"   Past Medical History:  Diagnosis Date   Acute medial meniscus tear of left knee 04/20/2016   Breast cancer ([place])    2010 ; left side mastectomy chemo pill   Depression    DVT (deep venous  thrombosis) ([place])    left arm   Family history of breast cancer in sister    sister with PALB2 mutation   Genetic testing 01/23/18   Multi-Cancer panel (83 genes) @ [facility] - Pathogenic mutation in the [place] gene   Hypothyroidism    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)   Osteopenia 09/01/2014   PTSD (post-traumatic stress disorder) 08/15/2014   Rupture of posterior cruciate ligament of left knee 04/20/2016   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 Authorizing Provider  amphetamine-dextroamphetamine (ADDERALL) 20 MG tablet Take 1 tablet (20 mg total) by mouth daily. 02/22/22  Yes Lesleigh Noe, MD  atorvastatin (LIPITOR) 10 MG tablet TAKE 1 TABLET BY MOUTH EVERY DAY 12/24/21  Yes Lesleigh Noe, MD  buPROPion (WELLBUTRIN XL) 300 MG 24 hr tablet Take 1 tablet (300 mg total) by mouth daily. 12/28/21  Yes Lesleigh Noe, MD  calcium-vitamin D (OSCAL WITH D) 500-200 MG-UNIT per tablet Take 1 tablet by mouth 2 (two) times daily. 09/04/14  Yes Baird Cancer, PA-C  levothyroxine (SYNTHROID) 100 MCG tablet TAKE 1 TABLET BY MOUTH EVERY DAY 02/16/22  Yes Pleas Koch, NP  MULTIPLE VITAMIN PO Take by mouth.   Yes [provider]    No Known Allergies  Gynecologic History: No LMP recorded. Patient is postmenopausal.  Obstetric History: No obstetric history on file.  Social History   Socioeconomic  History   Marital status: Divorced    Spouse name: Not on file   Number of children: 2   Years of education: Some College   Highest education level: Not on file  Occupational History   Not on file  Tobacco Use   Smoking status: Former    Packs/day: 0.50    Years: 14.00    Total pack years: 7.00    Types: Cigarettes    Quit date: 09/22/2010    Years since quitting: 11.4   Smokeless tobacco: Never  Vaping Use   Vaping Use: Never used  Substance and Sexual Activity   Alcohol use: Yes    Comment: rare use   Drug use: No   Sexual activity: Not Currently  Other Topics Concern   Not on file  Social History Narrative   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   Lives alone - has 2 children [name] and [name] - 4    Daughters are nearby and supportive    Enjoys: arts and crafts, fishing in her pond   Exercise: likes running, walking around at work   Diet: not great currently, likes fruit/veggies   Social Determinants of Radio broadcast assistant Strain: Clint  (07/17/2018)   Overall Financial Resource Strain (CARDIA)    Difficulty of Paying Living Expenses: Not hard at all  Food Insecurity: Not on file  Transportation Needs: Not on file  Physical Activity: Sufficiently Active (07/17/2018)   Exercise Vital Sign    Days of Exercise per Week: 7 days    Minutes of Exercise per Session: 30 min  Stress: Not on file  Social Connections: Not on file  Intimate Partner Violence: Not on file    Family History  Problem Relation Age of Onset   Hypertension Mother    Heart disease Mother    COPD Mother    Other Mother        Cow valve   Diabetes Father    Stroke Father 67   Hypertension Father    Melanoma Father    Breast cancer Sister 26       currently 30; PALB2 mutation/ deceased   Hypertension Brother    Hypertension Maternal Grandmother    Diabetes Paternal Grandmother    Lung cancer Paternal Grandfather        deceased 2s;  smoker    Review of Systems  Constitutional:  Negative for chills and fever.  HENT:  Negative for congestion and sore throat.   Eyes:  Negative for blurred vision and double vision.  Respiratory:  Negative for shortness of breath.   Cardiovascular:  Negative for chest pain.  Gastrointestinal:  Negative for heartburn, nausea and vomiting.  Genitourinary: Negative.   Musculoskeletal: Negative.  Negative for myalgias.  Skin:  Negative for rash.  Neurological:  Negative for dizziness and headaches.  Endo/Heme/Allergies:  Does not bruise/bleed easily.  Psychiatric/Behavioral:  Negative for depression. The patient is not nervous/anxious.      Physical Exam BP 118/78   Pulse 80   Temp (!) 97.3 F (36.3 C) (Temporal)   Ht 5' (1.524 m)   Wt 147 lb 4 oz (66.8 kg)   SpO2 95%   BMI 28.76 kg/m    BP Readings from Last 3 Encounters:  02/25/22 118/78  11/20/21 130/80  06/04/21 (!) 156/98      Physical Exam Exam conducted with a chaperone present.  Constitutional:      General: She is not in acute distress.    Appearance: She is well-developed. She is not diaphoretic.  HENT:     Head: Normocephalic and atraumatic.     Right Ear: External ear normal.     Left Ear: External ear normal.     Nose: Nose normal.  Eyes:     General: No scleral icterus.    Extraocular Movements: Extraocular movements intact.     Conjunctiva/sclera: Conjunctivae normal.  Cardiovascular:     Rate and Rhythm: Normal rate and regular rhythm.     Heart sounds: No murmur heard. Pulmonary:     Effort: Pulmonary effort is normal. No respiratory distress.     Breath sounds: Normal breath sounds. No wheezing.  Abdominal:  General: Bowel sounds are normal. There is no distension.     Palpations: Abdomen is soft. There is no mass.     Tenderness: There is no abdominal tenderness. There is no guarding or rebound.  Genitourinary:    Vagina: Normal.     Cervix: Normal.  Musculoskeletal:        General:  Normal range of motion.     Cervical back: Neck supple.  Lymphadenopathy:     Cervical: No cervical adenopathy.  Skin:    General: Skin is warm and dry.     Capillary Refill: Capillary refill takes less than 2 seconds.  Neurological:     Mental Status: She is alert and oriented to person, place, and time.     Deep Tendon Reflexes: Reflexes normal.  Psychiatric:        Mood and Affect: Mood normal.        Behavior: Behavior normal.     Results:  PHQ-9:  [place] Office Visit from 02/25/2022 in [place] at [place]  PHQ-9 Total Score 0         Assessment: 51 y.o. No obstetric history on file. female here for routine annual physical examination.  Plan: Problem List Items Addressed This Visit       Endocrine   Thyroid disease   Relevant Orders   TSH     Other   Hyperlipidemia   Relevant Orders   Lipid panel   Depression   Relevant Medications   buPROPion (WELLBUTRIN XL) 300 MG 24 hr tablet   Depression, major, single episode, in partial remission (HCC)    Controlled, continue Wellbutrin 30 mg daily      Relevant Medications   buPROPion (WELLBUTRIN XL) 300 MG 24 hr tablet   ADD (attention deficit disorder)    Stable, continue Adderall 20 mg daily.  We considered long-acting, but she was unable to get this filled at the pharmacy due to shortage.      Relevant Medications   amphetamine-dextroamphetamine (ADDERALL) 20 MG tablet (Start on 04/23/2022)   amphetamine-dextroamphetamine (ADDERALL) 20 MG tablet (Start on 03/25/2022)   Other Visit Diagnoses     Annual physical exam    -  Primary   Encounter for screening mammogram for malignant neoplasm of breast       Relevant Orders   MM 3D SCREEN BREAST BILATERAL   Cervical cancer screening       Relevant Orders   Cytology - PAP       Screening: -- Blood pressure screen normal -- cholesterol screening: will obtain -- Weight screening: overweight: continue to monitor -- Diabetes Screening:  not due for screening -- Nutrition: Encouraged healthy diet  The 10-year ASCVD risk score (Arnett DK, et al., 2019) is: 1.2%   Values used to calculate the score:     Age: 77 years     Sex: Female     Is Non-Hispanic African American: No     Diabetic: No     Tobacco smoker: No     Systolic Blood Pressure: 748 mmHg     Is BP treated: No     HDL Cholesterol: 86.1 mg/dL     Total Cholesterol: 291 mg/dL  -- Statin therapy for Age 41-75 with CVD risk >7.5%  Psych -- Depression screening (PHQ-9):  [address] Visit from 02/25/2022 in [facility] at [place]  PHQ-9 Total Score 0        Safety -- tobacco screening: not using -- alcohol screening:  low-risk usage. -- no evidence of domestic violence or intimate partner violence.   Cancer Screening -- pap smear collected per ASCCP guidelines -- family history of breast cancer screening: done. not at high risk. -- Mammogram - ordered -- Colon cancer (age 7+)--  up to date  Immunizations Immunization History  Administered Date(s) Administered   Influenza-Unspecified 05/22/2014   Tdap 04/16/2016    -- flu vaccine up to date -- TDAP q10 years up to date -- Shingles (age >40) declined -- Covid-19 Vaccine up to date   Encouraged healthy diet and exercise. Encouraged regular vision and dental care.    Lesleigh Noe, MD

## 2022-03-01 LAB — CYTOLOGY - PAP
Comment: NEGATIVE
Diagnosis: UNDETERMINED — AB
High risk HPV: NEGATIVE

## 2022-04-28 ENCOUNTER — Other Ambulatory Visit: Payer: Self-pay | Admitting: Family Medicine

## 2022-04-28 DIAGNOSIS — F900 Attention-deficit hyperactivity disorder, predominantly inattentive type: Secondary | ICD-10-CM

## 2022-05-25 ENCOUNTER — Other Ambulatory Visit: Payer: Self-pay | Admitting: Primary Care

## 2022-05-25 DIAGNOSIS — E890 Postprocedural hypothyroidism: Secondary | ICD-10-CM

## 2022-05-25 NOTE — Telephone Encounter (Signed)
Renee [name], patient with TOC to you in early November. Needs repeat TSH, I will refill levothyroxine for current dose now.

## 2022-06-17 ENCOUNTER — Encounter: Payer: Self-pay | Admitting: Family

## 2022-06-17 ENCOUNTER — Other Ambulatory Visit: Payer: Self-pay | Admitting: Family

## 2022-06-17 ENCOUNTER — Ambulatory Visit (INDEPENDENT_AMBULATORY_CARE_PROVIDER_SITE_OTHER): Payer: No Typology Code available for payment source | Admitting: Family

## 2022-06-17 ENCOUNTER — Encounter (HOSPITAL_COMMUNITY): Payer: Self-pay | Admitting: Hematology

## 2022-06-17 VITALS — BP 136/86 | HR 88 | Temp 97.8°F | Resp 16 | Ht 60.0 in | Wt 156.5 lb

## 2022-06-17 DIAGNOSIS — E559 Vitamin D deficiency, unspecified: Secondary | ICD-10-CM | POA: Diagnosis not present

## 2022-06-17 DIAGNOSIS — M79675 Pain in left toe(s): Secondary | ICD-10-CM | POA: Diagnosis not present

## 2022-06-17 DIAGNOSIS — Z853 Personal history of malignant neoplasm of breast: Secondary | ICD-10-CM

## 2022-06-17 DIAGNOSIS — Q2381 Bicuspid aortic valve: Secondary | ICD-10-CM

## 2022-06-17 DIAGNOSIS — Q231 Congenital insufficiency of aortic valve: Secondary | ICD-10-CM

## 2022-06-17 DIAGNOSIS — R87610 Atypical squamous cells of undetermined significance on cytologic smear of cervix (ASC-US): Secondary | ICD-10-CM

## 2022-06-17 DIAGNOSIS — M85852 Other specified disorders of bone density and structure, left thigh: Secondary | ICD-10-CM

## 2022-06-17 DIAGNOSIS — Z1501 Genetic susceptibility to malignant neoplasm of breast: Secondary | ICD-10-CM | POA: Diagnosis not present

## 2022-06-17 DIAGNOSIS — Z1589 Genetic susceptibility to other disease: Secondary | ICD-10-CM

## 2022-06-17 DIAGNOSIS — Z8639 Personal history of other endocrine, nutritional and metabolic disease: Secondary | ICD-10-CM

## 2022-06-17 DIAGNOSIS — Z1509 Genetic susceptibility to other malignant neoplasm: Secondary | ICD-10-CM

## 2022-06-17 DIAGNOSIS — E890 Postprocedural hypothyroidism: Secondary | ICD-10-CM

## 2022-06-17 DIAGNOSIS — F900 Attention-deficit hyperactivity disorder, predominantly inattentive type: Secondary | ICD-10-CM

## 2022-06-17 DIAGNOSIS — M21612 Bunion of left foot: Secondary | ICD-10-CM

## 2022-06-17 DIAGNOSIS — R011 Cardiac murmur, unspecified: Secondary | ICD-10-CM

## 2022-06-17 DIAGNOSIS — Z1231 Encounter for screening mammogram for malignant neoplasm of breast: Secondary | ICD-10-CM

## 2022-06-17 LAB — VITAMIN D 25 HYDROXY (VIT D DEFICIENCY, FRACTURES): VITD: 50.97 ng/mL (ref 30.00–100.00)

## 2022-06-17 LAB — URIC ACID: Uric Acid, Serum: 5.8 mg/dL (ref 2.4–7.0)

## 2022-06-17 LAB — TSH: TSH: 0.45 u[IU]/mL (ref 0.35–5.50)

## 2022-06-17 MED ORDER — AMPHETAMINE-DEXTROAMPHET ER 20 MG PO CP24: 20.0000 mg | ORAL_CAPSULE | Freq: Every day | ORAL | 0 refills | Status: DC

## 2022-06-17 NOTE — Patient Instructions (Addendum)
------------------------------------    I have ordered an ultrasound of your heart called an echocardiogram for your new murmur we heard on physical exam.  I will send you the information for how to schedule via mychart.   ------------------------------------ I have sent an electronic order over to your preferred location for the following:   '[]'$   2D Mammogram  '[x]'$   3D Mammogram , right breast with implant '[x]'$   Bone Density   Please give this center a call to get scheduled at your convenience.  '[x]'$   [facility]ogram , [phone]   Make sure to wear two piece  clothing  No lotions powders or deodorants the day of the appointment Make sure to bring picture ID and insurance card.  Bring list of medications you are currently taking including any supplements.    ------------------------------------ Stop by the lab prior to leaving today. I will notify you of your results once received.   ------------------------------------ Welcome to our clinic, I am happy to have you as my new patient. I am excited to continue on this healthcare journey with you.  Stop by the lab prior to leaving today. I will notify you of your results once received.   Please keep in mind Any my chart messages you send have up to a three business day turnaround for a response.  Phone calls may take up to a one full business day turnaround for a  response.   If you need a medication refill I recommend you request it through the pharmacy as this is easiest for [place] rather than sending a message and or phone call.   Due to recent changes in healthcare laws, you may see results of your imaging and/or laboratory studies on MyChart before I have had a chance to review them.  I understand that in some cases there may be results that are confusing or concerning to you. Please understand that not all results are received at the same time and often I may need to interpret multiple results in order to provide you with the  best plan of care or course of treatment. Therefore, I ask that you please give me 2 business days to thoroughly review all your results before contacting my office for clarification. Should we see a critical lab result, you will be contacted sooner.   It was a pleasure seeing you today! Please do not hesitate to reach out with any questions and or concerns.  Regards,   Eugenia Pancoast FNP-C

## 2022-06-17 NOTE — Progress Notes (Signed)
Established Patient Office Visit  Subjective:  Patient ID: Renee [name], female    DOB: 07/12/1971  Age: 51 y.o. MRN: [id number]  CC:  Chief Complaint  Patient presents with   Transitions Of Care    HPI [name] is here for a transition of care visit.  Prior provider was: Dr Waunita Schooner, MD  Pt is without acute concerns.   ASCUS: 02/25/22: pap, we will repeat next year. No vaginal pain, no discharge.   chronic concerns:  ADD: adderall 20 mg, however doesn't last throughout the day. Finds she is losing concentration come mid day . She tried to get XR a few months ago however was not in stock at pharmacy. Diagnosed as a child, she has been on adderall in the past. [name] off for a while and then restarted again as an adult. 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   Osteopenia: no longer on prolia. Was on this in the past.   Hypothyroid: notices she is gaining weight, sometimes with joint pains and stiffness and some fatigue. 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)   Osteopenia 09/01/2014   PTSD (post-traumatic stress disorder) 08/15/2014   Rupture of posterior cruciate ligament of left knee 04/20/2016   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   Thyroid disease     Past Surgical History:  Procedure Laterality Date   BREAST CAPSULECTOMY WITH IMPLANT EXCHANGE Left 08/19/2011   BREAST RECONSTRUCTION Left 01/15/2011   MINOR  IRRIGATION AND DEBRIDEMENT OF WOUND Left 03/05/2011   back and left breast   MODIFIED RADICAL MASTECTOMY Left 10/08/2009   PLACEMENT OF BREAST IMPLANTS Right 08/19/2011   PORT-A-CATH REMOVAL  07/15/2010   PORTACATH PLACEMENT  11/14/2009   with left breast wound revision    Family History  Problem Relation Age of Onset   Hypertension Mother    Heart disease Mother    COPD Mother    Other Mother        Cow valve   Diabetes Father    Stroke Father 36   Hypertension Father    Melanoma Father    Breast cancer Sister 43       currently 75; PALB2 mutation/ deceased   Hypertension Brother    Hypertension Maternal Grandmother    Diabetes Paternal Grandmother    Lung cancer Paternal Grandfather        deceased 24s; smoker    Social History   Socioeconomic History   Marital status: Divorced    Spouse name: Not on file   Number of children: 2   Years of education: Some College   Highest education level: Not on file  Occupational History   Not on file  Tobacco Use   Smoking status: Former    Packs/day: 0.50    Years: 14.00    Total pack years: 7.00  Types: Cigarettes    Quit date: 09/22/2010    Years since quitting: 11.7   Smokeless tobacco: Never  Vaping Use   Vaping Use: Never used  Substance and Sexual Activity   Alcohol use: Yes    Comment: rare use   Drug use: No   Sexual activity: Not Currently  Other Topics Concern   Not on file  Social History Narrative   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   Lives alone - has 2 children Renee [name] and Renee [name] - 4    Daughters are nearby and supportive    Enjoys: arts and crafts, fishing in her pond   Exercise: likes running, walking around at work   Diet: not great currently, likes fruit/veggies   Social Determinants of Health   Financial Resource Strain: [place]  (07/17/2018)   Overall Financial Resource Strain (CARDIA)    Difficulty of Paying Living Expenses: Not hard at all   Food Insecurity: Not on file  Transportation Needs: Not on file  Physical Activity: Sufficiently Active (07/17/2018)   Exercise Vital Sign    Days of Exercise per Week: 7 days    Minutes of Exercise per Session: 30 min  Stress: Not on file  Social Connections: Not on file  Intimate Partner Violence: Not on file    Outpatient Medications Prior to Visit  Medication Sig Dispense Refill   atorvastatin (LIPITOR) 10 MG tablet TAKE 1 TABLET BY MOUTH EVERY DAY 90 tablet 3   buPROPion (WELLBUTRIN XL) 300 MG 24 hr tablet Take 1 tablet (300 mg total) by mouth daily. 90 tablet 1   calcium-vitamin D (OSCAL WITH D) 500-200 MG-UNIT per tablet Take 1 tablet by mouth 2 (two) times daily. 60 tablet 11   levothyroxine (SYNTHROID) 100 MCG tablet Take 1 tablet by mouth every morning on an empty stomach with water only.  No food or other medications for 30 minutes. 90 tablet 0   MULTIPLE VITAMIN PO Take by mouth.     amphetamine-dextroamphetamine (ADDERALL) 20 MG tablet Take 1 tablet (20 mg total) by mouth daily. 30 tablet 0   amphetamine-dextroamphetamine (ADDERALL) 20 MG tablet Take 1 tablet (20 mg total) by mouth daily. 30 tablet 0   No facility-administered medications prior to visit.    No Known Allergies  ROS Review of Systems  Review of Systems  Respiratory:  Negative for shortness of breath.   Cardiovascular:  Negative for chest pain and palpitations.  Gastrointestinal:  Negative for constipation and diarrhea.  Genitourinary:  Negative for dysuria, frequency and urgency.  Musculoskeletal:  Negative for myalgias.  Psychiatric/Behavioral:  Negative for depression and suicidal ideas.   All other systems reviewed and are negative.    Objective:    Physical Exam Constitutional:      General: She is not in acute distress.    Appearance: Normal appearance. She is not ill-appearing.  HENT:     Right Ear: Tympanic membrane normal.     Left Ear: Tympanic membrane normal.     Nose: Nose  normal. No congestion or rhinorrhea.     Right Turbinates: Not enlarged or swollen.     Left Turbinates: Not enlarged or swollen.     Right Sinus: No maxillary sinus tenderness or frontal sinus tenderness.     Left Sinus: No maxillary sinus tenderness or frontal sinus tenderness.     Mouth/Throat:     Mouth: Mucous membranes are moist.     Pharynx:  No pharyngeal swelling, oropharyngeal exudate or posterior oropharyngeal erythema.     Tonsils: No tonsillar exudate.  Eyes:     Extraocular Movements: Extraocular movements intact.     Conjunctiva/sclera: Conjunctivae normal.     Pupils: Pupils are equal, round, and reactive to light.  Neck:     Thyroid: No thyroid mass.  Cardiovascular:     Rate and Rhythm: Normal rate and regular rhythm.     Heart sounds: Murmur heard.  Pulmonary:     Effort: Pulmonary effort is normal.     Breath sounds: Normal breath sounds.  Lymphadenopathy:     Cervical:     Right cervical: No superficial cervical adenopathy.    Left cervical: No superficial cervical adenopathy.  Neurological:     Mental Status: She is alert.      BP 136/86   Pulse 88   Temp 97.8 F (36.6 C)   Resp 16   Ht 5' (1.524 m)   Wt 156 lb 8 oz (71 kg)   SpO2 94%   BMI 30.56 kg/m  Wt Readings from Last 3 Encounters:  06/17/22 156 lb 8 oz (71 kg)  02/25/22 147 lb 4 oz (66.8 kg)  11/20/21 152 lb 4 oz (69.1 kg)     Health Maintenance Due  Topic Date Due   Zoster Vaccines- Shingrix (1 of 2) Never done   MAMMOGRAM  07/14/2021    There are no preventive care reminders to display for this patient.  Lab Results  Component Value Date   TSH 0.45 06/17/2022   Lab Results  Component Value Date   WBC 6.6 11/20/2021   HGB 13.8 11/20/2021   HCT 41.1 11/20/2021   MCV 106.3 (H) 11/20/2021   PLT 196.0 11/20/2021   Lab Results  Component Value Date   NA 137 11/20/2021   K 4.3 11/20/2021   CO2 30 11/20/2021   GLUCOSE 89 11/20/2021   BUN 17 11/20/2021   CREATININE 0.80  11/20/2021   BILITOT 0.6 11/20/2021   ALKPHOS 87 11/20/2021   AST 30 11/20/2021   ALT 18 11/20/2021   PROT 6.6 11/20/2021   ALBUMIN 4.3 11/20/2021   CALCIUM 9.1 11/20/2021   ANIONGAP 11 07/11/2020   GFR 84.88 11/20/2021   Lab Results  Component Value Date   CHOL 235 (H) 02/25/2022   Lab Results  Component Value Date   HDL 114.40 02/25/2022   Lab Results  Component Value Date   LDLCALC 97 02/25/2022   Lab Results  Component Value Date   TRIG 117.0 02/25/2022   Lab Results  Component Value Date   CHOLHDL 2 02/25/2022   No results found for: "HGBA1C"    Assessment & Plan:   Problem List Items Addressed This Visit       Endocrine   Hypothyroidism    Possibly symptomatic ordering tsh pending results For now continue levothyroxine 100 mcg once daily.       Relevant Orders   TSH (Completed)     Musculoskeletal and Integument   Osteopenia    Ordered dexa, pending results.       Relevant Orders   DG Bone Density   Bunion, left     Other   ADD (attention deficit disorder)    Changed to XR addreall from regular for hopefully longer lasting.       Relevant Medications   amphetamine-dextroamphetamine (ADDERALL XR) 20 MG 24 hr capsule   Monoallelic mutation of PALB2 gene   History of left breast cancer -  Primary    Ordering mammogram screening with implant, right breast only as h/o mastetecomy left breast.       Relevant Orders   MM 3D SCREEN BREAST W/IMPLANT UNI RIGHT   History of Graves' disease   Vitamin D deficiency    Ordered vitamin d pending results.        Relevant Orders   VITAMIN D 25 Hydroxy (Vit-D Deficiency, Fractures) (Completed)   Heart murmur    New finding on physical exam, pt asymptomatic.  Ordering echocardiogram, pendin results.       Relevant Orders   ECHOCARDIOGRAM COMPLETE (Completed)   ASCUS of cervix with negative high risk HPV    Reviewed pap 02/25/22  Will repeat next year 2024.       Other Visit Diagnoses      Encounter for screening mammogram for malignant neoplasm of breast       Relevant Orders   MM 3D SCREEN BREAST W/IMPLANT UNI RIGHT   Pain of left great toe       Relevant Orders   Uric acid (Completed)       Meds ordered this encounter  Medications   amphetamine-dextroamphetamine (ADDERALL XR) 20 MG 24 hr capsule    Sig: Take 1 capsule (20 mg total) by mouth daily.    Dispense:  30 capsule    Refill:  0    Order Specific Question:   Supervising Provider    Answer:   Diona Browner, AMY E [[id number]]    Follow-up: Return in about 3 months (around 09/17/2022) for f/u thyroid .    Eugenia Pancoast, FNP

## 2022-06-17 NOTE — Assessment & Plan Note (Signed)
Changed to XR addreall from regular for hopefully longer lasting.

## 2022-06-18 ENCOUNTER — Other Ambulatory Visit: Payer: Self-pay | Admitting: Family

## 2022-06-18 DIAGNOSIS — Z8639 Personal history of other endocrine, nutritional and metabolic disease: Secondary | ICD-10-CM

## 2022-06-18 DIAGNOSIS — E890 Postprocedural hypothyroidism: Secondary | ICD-10-CM

## 2022-06-23 ENCOUNTER — Encounter (HOSPITAL_COMMUNITY): Payer: Self-pay | Admitting: Hematology

## 2022-06-24 ENCOUNTER — Ambulatory Visit (HOSPITAL_COMMUNITY)
Admission: RE | Admit: 2022-06-24 | Discharge: 2022-06-24 | Disposition: A | Discharge status: Home / Self Care | Payer: PRIVATE HEALTH INSURANCE | Source: Ambulatory Visit | Attending: Family | Admitting: Family

## 2022-06-24 DIAGNOSIS — R011 Cardiac murmur, unspecified: Secondary | ICD-10-CM

## 2022-06-24 LAB — ECHOCARDIOGRAM COMPLETE
AR max vel: 2.33 cm2
AV Area VTI: 2.13 cm2
AV Area mean vel: 2.14 cm2
AV Mean grad: 6.4 mmHg
AV Peak grad: 12.7 mmHg
Ao pk vel: 1.78 m/s
Area-P 1/2: 4.31 cm2
S' Lateral: 2.7 cm

## 2022-06-25 DIAGNOSIS — R011 Cardiac murmur, unspecified: Secondary | ICD-10-CM | POA: Insufficient documentation

## 2022-06-25 DIAGNOSIS — R87610 Atypical squamous cells of undetermined significance on cytologic smear of cervix (ASC-US): Secondary | ICD-10-CM | POA: Insufficient documentation

## 2022-06-25 NOTE — Assessment & Plan Note (Signed)
Ordered dexa, pending results.

## 2022-06-25 NOTE — Assessment & Plan Note (Signed)
Reviewed pap 02/25/22  Will repeat next year 2024.

## 2022-06-25 NOTE — Assessment & Plan Note (Signed)
New finding on physical exam, pt asymptomatic.  Ordering echocardiogram, pendin results.

## 2022-06-25 NOTE — Assessment & Plan Note (Signed)
Ordered vitamin d pending results.   

## 2022-06-25 NOTE — Assessment & Plan Note (Signed)
Possibly symptomatic ordering tsh pending results For now continue levothyroxine 100 mcg once daily.

## 2022-06-25 NOTE — Assessment & Plan Note (Signed)
Ordering mammogram screening with implant, right breast only as h/o mastetecomy left breast.

## 2022-06-28 NOTE — Progress Notes (Signed)
Please advise pt, not sure if she saw my mychart response.   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 Usually you do not have symptoms until you are an adult. Are you experiencing any shortness of breath, dizziness, fainting spells?  With this is is important for [place] to monitor blood pressure and keep it regulated which we are doing a good job of. I will refer you to cardiology just to keep an eye on this for regular screening as well considering I did hear the heart murmur on auscultation. Any further questions please don't hesitate to reach out! I have attached a link for you to copy and paste which explains it all in better detail.  [email or web address]

## 2022-06-29 NOTE — Progress Notes (Signed)
Patent did not view their my chart test result that I responded to one week ago.  Please call them and advise them of the below results.

## 2022-07-16 ENCOUNTER — Telehealth: Payer: Self-pay | Admitting: Family

## 2022-07-16 NOTE — Telephone Encounter (Signed)
Patient called and asked if there is something the Renee [name] can prescribe for her for this sinus infection she has, she said she has green mucus coming out and sore throat and she requested a call back.

## 2022-07-17 ENCOUNTER — Ambulatory Visit
Admission: RE | Admit: 2022-07-17 | Discharge: 2022-07-17 | Disposition: A | Discharge status: Home / Self Care | Payer: PRIVATE HEALTH INSURANCE | Source: Ambulatory Visit

## 2022-07-17 VITALS — BP 150/94 | HR 98 | Temp 97.8°F | Resp 16

## 2022-07-17 DIAGNOSIS — J019 Acute sinusitis, unspecified: Secondary | ICD-10-CM | POA: Diagnosis not present

## 2022-07-17 DIAGNOSIS — B9789 Other viral agents as the cause of diseases classified elsewhere: Secondary | ICD-10-CM

## 2022-07-17 MED ORDER — IPRATROPIUM BROMIDE 0.06 % NA SOLN: 2.0000 | Freq: Four times a day (QID) | NASAL | 12 refills | Status: DC

## 2022-07-17 NOTE — ED Provider Notes (Signed)
Renee [name]    CSN: [id number] Arrival date & time: 07/17/22  1050      History   Chief Complaint Chief Complaint  Patient presents with   Nasal Congestion    Entered by patient    HPI Renee [name] is a 51 y.o. female.   HPI  Presents to urgent care with complaint of nasal congestion and sinus pressure x 3 days. She states she can feel pressure in her face around her eyes and nose. Endorses discomfort.  Denies fever, chills, myalgias.  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)   Osteopenia 09/01/2014   PTSD (post-traumatic stress disorder) 08/15/2014   Rupture of posterior cruciate ligament of left knee 04/20/2016   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   Thyroid disease     Patient Active Problem List   Diagnosis Date Noted   Heart murmur 06/25/2022   ASCUS of cervix with negative high risk HPV 06/25/2022   History of left breast cancer 06/17/2022   History of Graves' disease 06/17/2022   Vitamin D deficiency 06/17/2022   Bunion, left 06/17/2022   White coat syndrome without diagnosis of hypertension 06/04/2021   Skin lesion 01/20/2021   Atherosclerosis of aorta ([place]) 97/09/6376   Monoallelic mutation of PALB2 gene    Macrocytosis without anemia 09/27/2015   ADD (attention deficit disorder) 11/07/2014   Osteopenia 09/01/2014   Depression, major, single episode, in partial remission ([place]) 08/15/2014   PTSD (post-traumatic stress disorder) 08/15/2014   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      Home Medications    Prior to Admission medications   Medication Sig Start Date End Date Taking? Authorizing Provider  amphetamine-dextroamphetamine (ADDERALL XR) 20 MG 24 hr capsule Take 1 capsule (20 mg total) by mouth daily. 06/17/22   Eugenia Pancoast, FNP  atorvastatin (LIPITOR) 10 MG tablet TAKE 1 TABLET BY MOUTH EVERY DAY 12/24/21   Waunita Schooner, MD  buPROPion (WELLBUTRIN XL) 300 MG 24 hr tablet Take 1 tablet (300 mg total) by mouth daily. 02/25/22   Waunita Schooner, MD  calcium-vitamin D (OSCAL WITH D) 500-200 MG-UNIT per tablet Take 1 tablet by mouth 2 (two) times daily. 09/04/14   Baird Cancer, PA-C  levothyroxine (SYNTHROID) 100 MCG tablet Take 1 tablet by mouth every morning on an empty stomach with water only.  No food or other medications for 30  minutes. 05/25/22   Pleas Koch, NP  MULTIPLE VITAMIN PO Take by mouth.    [provider]    Family History Family History  Problem Relation Age of Onset   Hypertension Mother    Heart disease Mother    COPD Mother    Other Mother        Cow valve   Diabetes Father    Stroke Father 69   Hypertension Father    Melanoma Father    Breast cancer Sister 72       currently 33; PALB2 mutation/ deceased   Hypertension Brother    Hypertension Maternal Grandmother    Diabetes Paternal Grandmother    Lung cancer Paternal Grandfather        deceased 28s; smoker     Social History Social History   Tobacco Use   Smoking status: Former    Packs/day: 0.50    Years: 14.00    Total pack years: 7.00    Types: Cigarettes    Quit date: 09/22/2010    Years since quitting: 11.8   Smokeless tobacco: Never  Vaping Use   Vaping Use: Never used  Substance Use Topics   Alcohol use: Yes    Comment: rare use   Drug use: No     Allergies   Patient has no known allergies.   Review of Systems Review of Systems   Physical Exam Triage Vital Signs ED Triage Vitals  Enc Vitals Group     BP      Pulse      Resp      Temp      Temp src      SpO2      Weight      Height      Head Circumference      Peak Flow      Pain Score      Pain Loc      Pain Edu?      Excl. in [place]?    No data found.  Updated Vital Signs There were no vitals taken for this visit.  Visual Acuity Right Eye Distance:   Left Eye Distance:   Bilateral Distance:    Right Eye Near:   Left Eye Near:    Bilateral Near:     Physical Exam Vitals reviewed.  Constitutional:      Appearance: Normal appearance.  HENT:     Nose:     Right Sinus: Maxillary sinus tenderness and frontal sinus tenderness present.     Left Sinus: Maxillary sinus tenderness and frontal sinus tenderness present.  Skin:    General: Skin is warm and dry.  Neurological:     General: No focal deficit present.     Mental Status: She is alert and oriented to person, place, and time.  Psychiatric:        Mood and Affect: Mood normal.        Behavior: Behavior normal.      UC Treatments / Results  Labs (all labs ordered are listed, but only abnormal results are displayed) Labs Reviewed - No data to display  EKG   Radiology No results found.  Procedures Procedures (including critical care time)  Medications Ordered in UC Medications - No data to display  Initial Impression / Assessment and Plan / UC Course  I have reviewed the triage vital signs and the nursing  notes.  Pertinent labs & imaging results that were available during my care of the  patient were reviewed by me and considered in my medical decision making (see chart for details).   Given short duration of symptoms, suspect viral sinusitis and recommending treatment with nasal decongestant. Discussed prescription of prednisone if symptoms are severe however patient elected to first use conservative OTC treatment. She will continue to use flonase. Also provide ipratropium nasal spray to relieve rhinitis.   Final Clinical Impressions(s) / UC Diagnoses   Final diagnoses:  None   Discharge Instructions   None    ED Prescriptions   None    PDMP not reviewed this encounter.   Rose Phi, [place] 07/17/22 1115

## 2022-07-17 NOTE — Discharge Instructions (Addendum)
You have been diagnosed with a viral upper respiratory infection based on your symptoms and exam. Viral illnesses cannot be treated with antibiotics - they are self limiting - and you should find your symptoms resolving within a few days. Get plenty of rest and non-caffeinated fluids.  We recommend you use over-the-counter medications for symptom control including Tylenol or ibuprofen for fever, chills or body aches, and cold/cough medication.  Saline mist spray is helpful for removing excess mucus from your nose.  Room humidifiers are helpful to ease breathing at night. You might also find relief of nasal/sinus congestion symptoms by using a nasal decongestant such as Sudafed sinus (pseudoephedrine).  You will need to obtain this medication from behind the pharmacist counter.  Speak to the pharmacist to verify that you are not duplicating medications with other over-the-counter formulations that you may be using.   Follow up here or with your primary care provider if your symptoms are worsening or not improving.    

## 2022-07-17 NOTE — ED Triage Notes (Signed)
Pt. Presents to UC w/ c/o nasal congestion and sinus pressure for the past 3 days.

## 2022-07-19 ENCOUNTER — Ambulatory Visit: Payer: PRIVATE HEALTH INSURANCE | Admitting: Family

## 2022-07-26 ENCOUNTER — Other Ambulatory Visit: Payer: Self-pay | Admitting: Family

## 2022-07-26 DIAGNOSIS — F900 Attention-deficit hyperactivity disorder, predominantly inattentive type: Secondary | ICD-10-CM

## 2022-07-28 MED ORDER — AMPHETAMINE-DEXTROAMPHET ER 20 MG PO CP24: 20.0000 mg | ORAL_CAPSULE | Freq: Every day | ORAL | 0 refills | Status: DC

## 2022-08-31 ENCOUNTER — Other Ambulatory Visit: Payer: Self-pay | Admitting: Primary Care

## 2022-08-31 DIAGNOSIS — E890 Postprocedural hypothyroidism: Secondary | ICD-10-CM

## 2022-09-10 ENCOUNTER — Encounter: Payer: Self-pay | Admitting: Family

## 2022-09-10 ENCOUNTER — Ambulatory Visit (INDEPENDENT_AMBULATORY_CARE_PROVIDER_SITE_OTHER): Payer: PRIVATE HEALTH INSURANCE | Admitting: Family

## 2022-09-10 VITALS — BP 170/110 | HR 85 | Temp 98.3°F | Ht 60.0 in | Wt 155.8 lb

## 2022-09-10 DIAGNOSIS — I10 Essential (primary) hypertension: Secondary | ICD-10-CM | POA: Diagnosis not present

## 2022-09-10 DIAGNOSIS — L259 Unspecified contact dermatitis, unspecified cause: Secondary | ICD-10-CM

## 2022-09-10 DIAGNOSIS — F900 Attention-deficit hyperactivity disorder, predominantly inattentive type: Secondary | ICD-10-CM | POA: Diagnosis not present

## 2022-09-10 DIAGNOSIS — R21 Rash and other nonspecific skin eruption: Secondary | ICD-10-CM | POA: Diagnosis not present

## 2022-09-10 DIAGNOSIS — R011 Cardiac murmur, unspecified: Secondary | ICD-10-CM

## 2022-09-10 DIAGNOSIS — Z853 Personal history of malignant neoplasm of breast: Secondary | ICD-10-CM | POA: Insufficient documentation

## 2022-09-10 MED ORDER — AMPHETAMINE-DEXTROAMPHETAMINE 20 MG PO TABS: 20.0000 mg | ORAL_TABLET | Freq: Every day | ORAL | 0 refills | Status: DC

## 2022-09-10 MED ORDER — AMLODIPINE BESYLATE 5 MG PO TABS: 5.0000 mg | ORAL_TABLET | Freq: Every day | ORAL | 1 refills | Status: DC

## 2022-09-10 MED ORDER — CLOBETASOL PROPIONATE 0.05 % EX SOLN: 1.0000 | Freq: Two times a day (BID) | CUTANEOUS | 0 refills | Status: DC

## 2022-09-10 MED ORDER — TRIAMCINOLONE ACETONIDE 0.1 % EX CREA: 1.0000 | TOPICAL_CREAM | Freq: Two times a day (BID) | CUTANEOUS | 0 refills | Status: DC

## 2022-09-10 MED ORDER — PREDNISONE 20 MG PO TABS: ORAL_TABLET | ORAL | 0 refills | Status: DC

## 2022-09-10 NOTE — Assessment & Plan Note (Signed)
Change adderall from XR to regular  In the future consider 1 pm dosing of 5-10 mg adderall but now desire to get blood pressure under control Pdmp reviewed.

## 2022-09-10 NOTE — Progress Notes (Signed)
Established Patient Office Visit  Subjective:   Patient ID: Renee [name], female    DOB: Jun 11, 1971  Age: 52 y.o. MRN: [id number]  CC:  Chief Complaint  Patient presents with   Rash    Neck, head and back for 1 month.    HPI: Renee [name] is a 52 y.o. female presenting on 09/10/2022 for Rash (Neck, head and back for 1 month.)   About one month ago started with rash, that has increasingly gotten worse. Started on top of left frontal scalp and has started to itch on back of neck and crown of head. Also has an itchy area of mid to upper back.   Has not changed any detergent, shampoo or conditioner. No new medications either.   Tried benadryl without relief . Has put on otc cortisone cream with mild relief.   ADHD: changed back to XR adderall and states no change in focus. Still doesn't last throughout the day, and notices decreased focus in the afternoon which results in less progression during the day.   Elevated bp without dx of htn: checks at work, average 160/100 or higher. No cp palp or sob. Feels cheeks are flush when this occurs.   Wt Readings from Last 3 Encounters:  09/10/22 155 lb 12.8 oz (70.7 kg)  06/17/22 156 lb 8 oz (71 kg)  02/25/22 147 lb 4 oz (66.8 kg)   Temp Readings from Last 3 Encounters:  09/10/22 98.3 F (36.8 C) (Temporal)  07/17/22 97.8 F (36.6 C)  06/17/22 97.8 F (36.6 C)   BP Readings from Last 3 Encounters:  09/10/22 (!) 170/110  07/17/22 (!) 150/94  06/17/22 136/86   Pulse Readings from Last 3 Encounters:  09/10/22 85  07/17/22 98  06/17/22 88        ROS: Negative unless specifically indicated above in HPI.   Relevant past medical history reviewed and updated as indicated.   Allergies and medications reviewed and updated.   Current Outpatient Medications:    amLODipine (NORVASC) 5 MG tablet, Take 1 tablet (5 mg total) by mouth daily., Disp: 90 tablet, Rfl: 1   amphetamine-dextroamphetamine (ADDERALL) 20 MG tablet, Take 1  tablet (20 mg total) by mouth daily before breakfast., Disp: 30 tablet, Rfl: 0   [START ON 10/10/2022] amphetamine-dextroamphetamine (ADDERALL) 20 MG tablet, Take 1 tablet (20 mg total) by mouth daily before breakfast., Disp: 30 tablet, Rfl: 0   [START ON 11/09/2022] amphetamine-dextroamphetamine (ADDERALL) 20 MG tablet, Take 1 tablet (20 mg total) by mouth daily before breakfast., Disp: 30 tablet, Rfl: 0   buPROPion (WELLBUTRIN XL) 300 MG 24 hr tablet, Take 1 tablet (300 mg total) by mouth daily., Disp: 90 tablet, Rfl: 1   clobetasol (TEMOVATE) 0.05 % external solution, Apply 1 Application topically 2 (two) times daily., Disp: 50 mL, Rfl: 0   levothyroxine (SYNTHROID) 100 MCG tablet, TAKE 1 TAB EVERY MORNING ON AN EMPTY STOMACH WITH WATER ONLY. NO FOOD OR OTHER MEDICATIONS X30 MIN, Disp: 90 tablet, Rfl: 0   predniSONE (DELTASONE) 20 MG tablet, Take two tablets po qd for five days, one tablet po qd for five days, then 1/2 tablet po qd for five days, Disp: 18 tablet, Rfl: 0   triamcinolone cream (KENALOG) 0.1 %, Apply 1 Application topically 2 (two) times daily., Disp: 30 g, Rfl: 0  Not on File  Objective:   BP (!) 170/110   Pulse 85   Temp 98.3 F (36.8 C) (Temporal)   Ht 5' (1.524 m)  Wt 155 lb 12.8 oz (70.7 kg)   SpO2 98%   BMI 30.43 kg/m    Physical Exam Constitutional:      General: She is not in acute distress.    Appearance: Normal appearance. She is normal weight. She is not ill-appearing, toxic-appearing or diaphoretic.  HENT:     Head: Normocephalic.  Cardiovascular:     Rate and Rhythm: Normal rate and regular rhythm.  Pulmonary:     Effort: Pulmonary effort is normal.  Musculoskeletal:        General: Normal range of motion.     Right lower leg: No edema.     Left lower leg: No edema.  Skin:    Findings: Rash (papular rash with base of erythema mid to upper back diffuse) present.     Comments: Scalp rash on posterior scalp with annual redness and raised erythema in  patches No silver scaling.   Neurological:     General: No focal deficit present.     Mental Status: She is alert and oriented to person, place, and time. Mental status is at baseline.  Psychiatric:        Mood and Affect: Mood normal.        Behavior: Behavior normal.        Thought Content: Thought content normal.        Judgment: Judgment normal.     Assessment & Plan:  Primary hypertension Assessment & Plan: New diagnosis  Start amlodipine  Pt advised of the following:  Continue medication as prescribed. Monitor blood pressure periodically and/or when you feel symptomatic. Goal is <130/90 on average. Ensure that you have rested for 30 minutes prior to checking your blood pressure. Record your readings and bring them to your next visit if necessary.work on a low sodium diet.   Orders: -     amLODIPine Besylate; Take 1 tablet (5 mg total) by mouth daily.  Dispense: 90 tablet; Refill: 1  Attention deficit hyperactivity disorder (ADHD), predominantly inattentive type Assessment & Plan: Change adderall from XR to regular  In the future consider 1 pm dosing of 5-10 mg adderall but now desire to get blood pressure under control Pdmp reviewed.  Orders: -     Amphetamine-Dextroamphetamine; Take 1 tablet (20 mg total) by mouth daily before breakfast.  Dispense: 30 tablet; Refill: 0 -     Amphetamine-Dextroamphetamine; Take 1 tablet (20 mg total) by mouth daily before breakfast.  Dispense: 30 tablet; Refill: 0 -     Amphetamine-Dextroamphetamine; Take 1 tablet (20 mg total) by mouth daily before breakfast.  Dispense: 30 tablet; Refill: 0  Rash and nonspecific skin eruption Assessment & Plan: Rx prednisone taper  Triamcinolone cream to use topically   For scalp dermatitis trial clobetasol  If not successful trial ketoconazole shampoo  Orders: -     predniSONE; Take two tablets po qd for five days, one tablet po qd for five days, then 1/2 tablet po qd for five days  Dispense: 18  tablet; Refill: 0 -     Triamcinolone Acetonide; Apply 1 Application topically 2 (two) times daily.  Dispense: 30 g; Refill: 0  Contact dermatitis of scalp -     Clobetasol Propionate; Apply 1 Application topically 2 (two) times daily.  Dispense: 50 mL; Refill: 0  Heart murmur Assessment & Plan: Not heard on exam today       Follow up plan: Return in about 3 weeks (around 10/01/2022) for f/u blood pressure.  Eugenia Pancoast, FNP

## 2022-09-10 NOTE — Patient Instructions (Addendum)
  Clobetasol solution to your scalp, nightly for two weeks then twice weekly for maintenance. Take prednisone as prescribed in its entirety.  Try zyrtec nightly.  Aveeno baths as needed.   Start amlodipine for blood pressure control.   ------------------------------------  Start monitoring your blood pressure daily, around the same time of day, for the next 2-3 weeks.  Ensure that you have rested for 30 minutes prior to checking your blood pressure. Record your readings and bring them to your next visit.  ------------------------------------  Change adderall to non extended release, refill sent in .    Regards,   Eugenia Pancoast FNP-C

## 2022-09-10 NOTE — Assessment & Plan Note (Signed)
New diagnosis  Start amlodipine  Pt advised of the following:  Continue medication as prescribed. Monitor blood pressure periodically and/or when you feel symptomatic. Goal is <130/90 on average. Ensure that you have rested for 30 minutes prior to checking your blood pressure. Record your readings and bring them to your next visit if necessary.work on a low sodium diet.

## 2022-09-10 NOTE — Assessment & Plan Note (Signed)
Rx prednisone taper  Triamcinolone cream to use topically   For scalp dermatitis trial clobetasol  If not successful trial ketoconazole shampoo

## 2022-09-10 NOTE — Assessment & Plan Note (Signed)
Not heard on exam today

## 2022-09-14 ENCOUNTER — Encounter: Payer: Self-pay | Admitting: Family

## 2022-09-14 ENCOUNTER — Encounter: Payer: Self-pay | Admitting: Emergency Medicine

## 2022-09-14 ENCOUNTER — Emergency Department: Payer: PRIVATE HEALTH INSURANCE

## 2022-09-14 ENCOUNTER — Emergency Department
Admission: EM | Admit: 2022-09-14 | Discharge: 2022-09-14 | Disposition: A | Discharge status: Home / Self Care | Payer: PRIVATE HEALTH INSURANCE | Attending: Student in an Organized Health Care Education/Training Program | Admitting: Student in an Organized Health Care Education/Training Program

## 2022-09-14 ENCOUNTER — Other Ambulatory Visit: Payer: Self-pay

## 2022-09-14 DIAGNOSIS — Z79899 Other long term (current) drug therapy: Secondary | ICD-10-CM | POA: Insufficient documentation

## 2022-09-14 DIAGNOSIS — I10 Essential (primary) hypertension: Secondary | ICD-10-CM | POA: Insufficient documentation

## 2022-09-14 DIAGNOSIS — L259 Unspecified contact dermatitis, unspecified cause: Secondary | ICD-10-CM | POA: Diagnosis not present

## 2022-09-14 DIAGNOSIS — R519 Headache, unspecified: Secondary | ICD-10-CM | POA: Diagnosis present

## 2022-09-14 LAB — CBC
HCT: 41.6 % (ref 36.0–46.0)
Hemoglobin: 14.3 g/dL (ref 12.0–15.0)
MCH: 34.9 pg — ABNORMAL HIGH (ref 26.0–34.0)
MCHC: 34.4 g/dL (ref 30.0–36.0)
MCV: 101.5 fL — ABNORMAL HIGH (ref 80.0–100.0)
Platelets: 243 10*3/uL (ref 150–400)
RBC: 4.1 MIL/uL (ref 3.87–5.11)
RDW: 12.8 % (ref 11.5–15.5)
WBC: 11.4 10*3/uL — ABNORMAL HIGH (ref 4.0–10.5)
nRBC: 0 % (ref 0.0–0.2)

## 2022-09-14 LAB — BASIC METABOLIC PANEL
Anion gap: 14 (ref 5–15)
BUN: 21 mg/dL — ABNORMAL HIGH (ref 6–20)
CO2: 27 mmol/L (ref 22–32)
Calcium: 9.7 mg/dL (ref 8.9–10.3)
Chloride: 95 mmol/L — ABNORMAL LOW (ref 98–111)
Creatinine, Ser: 0.64 mg/dL (ref 0.44–1.00)
GFR, Estimated: >60 mL/min (ref >60)
Glucose, Bld: 100 mg/dL — ABNORMAL HIGH (ref 70–99)
Potassium: 3.4 mmol/L — ABNORMAL LOW (ref 3.5–5.1)
Sodium: 136 mmol/L (ref 135–145)

## 2022-09-14 MED ORDER — CLONIDINE HCL 0.1 MG PO TABS
0.2000 mg | ORAL_TABLET | Freq: Once | ORAL | Status: AC
  Administered 2022-09-14: 0.2 mg via ORAL
  Filled 2022-09-14: qty 2

## 2022-09-14 MED ORDER — AMLODIPINE BESYLATE 5 MG PO TABS
5.0000 mg | ORAL_TABLET | Freq: Once | ORAL | Status: AC
  Administered 2022-09-14: 5 mg via ORAL
  Filled 2022-09-14: qty 1

## 2022-09-14 NOTE — Telephone Encounter (Signed)
Patient checked her BP about 30 minutes ago at work. She had been setting down for 5 minutes before she checked. She denies any missed doses after starting new medication. Denies any chest pain, palpitations, sob, headache. She has noticed increased thirst. She started on amlodipine 5 mg. Reviewed red words if any she will go to ED or call 911.

## 2022-09-14 NOTE — ED Triage Notes (Signed)
Pt comes with c/o HTN. Pt was just started on BP med on Friday. Pt states some dizziness. Pt BP is 198/113 currently. Pt denies any blurry vision.  Pt states some sob. Pt denies any headache.

## 2022-09-14 NOTE — ED Provider Notes (Signed)
[facility] Provider Note    Event Date/Time   First MD Initiated Contact with Patient 09/14/22 1647     (approximate)   History   Hypertension   HPI  Renee [name] is a 52 y.o. female history of PTSD, depression recent diagnosis of hypertension currently taking prednisone as well as ibuprofen for contact dermatitis presents to the ER due to concern for elevated blood pressure.  Does have mild headache feels like her face is flushed denies any chest pain no shortness of breath.  No numbness or tingling.  No sudden onset or thunderclap headache.  Denies any orthopnea no lower extremity swelling.  She did take 5 mg amlodipine this morning.  Was discussing with her PCP that they are planning to increase it to 10.     Physical Exam   Triage Vital Signs: ED Triage Vitals  Enc Vitals Group     BP 09/14/22 1602 (!) 198/113     Pulse Rate 09/14/22 1602 89     Resp 09/14/22 1602 18     Temp 09/14/22 1602 97.7 F (36.5 C)     Temp Source 09/14/22 1602 Oral     SpO2 09/14/22 1602 96 %     Weight 09/14/22 1648 156 lb 8.4 oz (71 kg)     Height 09/14/22 1648 5' (1.524 m)     Head Circumference --      Peak Flow --      Pain Score 09/14/22 1605 0     Pain Loc --      Pain Edu? --      Excl. in [place]? --     Most recent vital signs: Vitals:   09/14/22 1712 09/14/22 1805  BP: (!) 198/113 (!) 166/93  Pulse:  80  Resp:  18  Temp:    SpO2:  96%     Constitutional: Alert  Eyes: Conjunctivae are normal.  Head: Atraumatic. Nose: No congestion/rhinnorhea. Mouth/Throat: Mucous membranes are moist.   Neck: Painless ROM.  Cardiovascular:   Good peripheral circulation. Respiratory: Normal respiratory effort.  No retractions.  Gastrointestinal: Soft and nontender.  Musculoskeletal:  no deformity Neurologic:  MAE spontaneously. No gross focal neurologic deficits are appreciated.  Skin:  Skin is warm, dry and intact. No rash noted. Psychiatric: Mood and affect  are normal. Speech and behavior are normal.    ED Results / Procedures / Treatments   Labs (all labs ordered are listed, but only abnormal results are displayed) Labs Reviewed  BASIC METABOLIC PANEL - Abnormal; Notable for the following components:      Result Value   Potassium 3.4 (*)    Chloride 95 (*)    Glucose, Bld 100 (*)    BUN 21 (*)    All other components within normal limits  CBC - Abnormal; Notable for the following components:   WBC 11.4 (*)    MCV 101.5 (*)    MCH 34.9 (*)    All other components within normal limits  URINALYSIS, ROUTINE W REFLEX MICROSCOPIC  CBG MONITORING, ED  POC URINE PREG, ED     EKG  ED ECG REPORT I, Merlyn Lot, the attending physician, personally viewed and interpreted this ECG.   Date: 09/14/2022  EKG Time: 16:11  Rate: 90  Rhythm: sinus  Axis: normal  Intervals: normal  ST&T Change: no stemi, no depressions    RADIOLOGY Please see ED Course for my review and interpretation.  I personally reviewed all radiographic images ordered  to evaluate for the above acute complaints and reviewed radiology reports and findings.  These findings were personally discussed with the patient.  Please see medical record for radiology report.    PROCEDURES:  Critical Care performed:   Procedures   MEDICATIONS ORDERED IN ED: Medications  amLODipine (NORVASC) tablet 5 mg (5 mg Oral Given 09/14/22 1712)  cloNIDine (CATAPRES) tablet 0.2 mg (0.2 mg Oral Given 09/14/22 1801)     IMPRESSION / MDM / ASSESSMENT AND PLAN / ED COURSE  I reviewed the triage vital signs and the nursing notes.                              Differential diagnosis includes, but is not limited to, htn, medication effect, CVA, SDH, IPH, tension, hypertensive urgency Patient presenting to the ER for evaluation of symptoms as described above.  Based on symptoms, risk factors and considered above differential, this presenting complaint could reflect a potentially  life-threatening illness therefore the patient will be placed on continuous pulse oximetry and telemetry for monitoring.  Laboratory evaluation will be sent to evaluate for the above complaints.  Patient is complaining of mild headache given her hypertension and age and risk factors will order CT imaging of the head that she has no neurodeficits does not seem consistent with CVA.  Denying any chest pain or pressure.  Her EKG is nonischemic.  Blood work otherwise reassuring will give 5 mg Norvasc.    Clinical Course as of 09/14/22 1832  Tue Sep 14, 2022  1748 Chest x-ray on my review and interpretation without evidence of consolidation. [PR]  1831 Patient reassessed.  She remains well-appearing in no acute distress.  Blood pressure improved with oral medication.  Does admit to quite a bit of stress at work.  We discussed my recommendation that she stop taking NSAIDs and that her prednisone may be contributing to this as well.  She is not having any signs of endorgan damage and does appear stable and appropriate for outpatient follow-up. [PR]    Clinical Course User Index [PR] Merlyn Lot, MD     FINAL CLINICAL IMPRESSION(S) / ED DIAGNOSES   Final diagnoses:  Uncontrolled hypertension     Rx / DC Orders   ED Discharge Orders     None        Note:  This document was prepared using Dragon voice recognition software and may include unintentional dictation errors.    Merlyn Lot, MD 09/14/22 [phone]

## 2022-09-15 ENCOUNTER — Telehealth: Payer: Self-pay

## 2022-09-15 NOTE — Telephone Encounter (Signed)
Transition Care Management Unsuccessful Follow-up Telephone Call  Date of discharge and from where:  [facility] 09/14/22    Attempts:  1st Attempt  Reason for unsuccessful TCM follow-up call:  Left voice message

## 2022-09-16 ENCOUNTER — Encounter: Payer: Self-pay | Admitting: Family

## 2022-09-16 NOTE — Telephone Encounter (Signed)
Transition Care Management Follow-up Telephone Call Date of discharge and from where: 09/14/22 from [facility]  for HTN   How have you been since you were released from the hospital? Feeling better  Any questions or concerns? No  Items Reviewed: Did the pt receive and understand the discharge instructions provided? Yes  Medications obtained and verified? Yes  Other? No  Any new allergies since your discharge? Yes  Dietary orders reviewed? No Do you have support at home? Yes   Home Care and Equipment/Supplies: Were home health services ordered? yes If so, what is the name of the agency?   Has the agency set up a time to come to the patient's home? not applicable Were any new equipment or medical supplies ordered?  No What is the name of the medical supply agency?  Were you able to get the supplies/equipment? not applicable Do you have any questions related to the use of the equipment or supplies? No  Functional Questionnaire: (I = Independent and D = Dependent) ADLs: I  Bathing/Dressing- I  Meal Prep- I  Eating- I  Maintaining continence- I  Transferring/Ambulation- I  Managing Meds- I  Follow up appointments reviewed:  PCP Hospital f/u appt confirmed? Yes  Scheduled to see Tabitha on 09/21/22 @ 7:20am. [facility] f/u appt confirmed? No  Are transportation arrangements needed? No  If their condition worsens, is the pt aware to call PCP or go to the Emergency Dept.? Yes Was the patient provided with contact information for the PCP's office or ED? Yes Was to pt encouraged to call back with questions or concerns? Yes

## 2022-09-17 NOTE — Telephone Encounter (Signed)
Have touched base with patient post discharge.

## 2022-09-21 ENCOUNTER — Ambulatory Visit (INDEPENDENT_AMBULATORY_CARE_PROVIDER_SITE_OTHER): Payer: PRIVATE HEALTH INSURANCE | Admitting: Family

## 2022-09-21 ENCOUNTER — Encounter: Payer: Self-pay | Admitting: Family

## 2022-09-21 VITALS — BP 146/96 | HR 88 | Temp 98.6°F | Ht 60.0 in | Wt 152.0 lb

## 2022-09-21 DIAGNOSIS — I10 Essential (primary) hypertension: Secondary | ICD-10-CM

## 2022-09-21 DIAGNOSIS — Q231 Congenital insufficiency of aortic valve: Secondary | ICD-10-CM | POA: Diagnosis not present

## 2022-09-21 DIAGNOSIS — R011 Cardiac murmur, unspecified: Secondary | ICD-10-CM

## 2022-09-21 DIAGNOSIS — H1033 Unspecified acute conjunctivitis, bilateral: Secondary | ICD-10-CM

## 2022-09-21 MED ORDER — AMLODIPINE BESYLATE 10 MG PO TABS: 10.0000 mg | ORAL_TABLET | Freq: Every day | ORAL | 1 refills | Status: DC

## 2022-09-21 MED ORDER — OFLOXACIN 0.3 % OP SOLN: 1.0000 [drp] | Freq: Four times a day (QID) | OPHTHALMIC | 0 refills | Status: AC

## 2022-09-21 NOTE — Assessment & Plan Note (Signed)
Rx ofloxacin eye solution  rx for ofloxacin sent to pt pharmacy, take as prescribed.  Discussed how to wash eye appropriately with warm warm cloth from inner to outer canthus. Wash bed sheets and pillow cases to prevent re-infection. Frequent handwashing. If no improvement in 24-48 hours with eye drops as prescribed, please call office.

## 2022-09-21 NOTE — Progress Notes (Addendum)
Established Patient Office Visit  Subjective:      CC:  Chief Complaint  Patient presents with   Hypertension    ED follow up    HPI: Renee [name] is a 51 y.o. female presenting on 09/21/2022 for Hypertension (ED follow up) .   New complaints:  Went to ED 2/6 for elevated blood pressure with a mild headache.  Doubled amlodipine to 10 mg once daily from 5 mg. No longer with a headache.   121/87  126/86 109/80  110/72 120/80 yesterday  111/76  About a week ago also started with matting of her eyes in the am. No excess discharge during the day but at night draining.    Social history:  Relevant past medical, surgical, family and social history reviewed and updated as indicated. Interim medical history since our last visit reviewed.  Allergies and medications reviewed and updated.  DATA REVIEWED: CHART IN EPIC     ROS: Negative unless specifically indicated above in HPI.    Current Outpatient Medications:    amLODipine (NORVASC) 10 MG tablet, Take 1 tablet (10 mg total) by mouth daily., Disp: 90 tablet, Rfl: 1   amphetamine-dextroamphetamine (ADDERALL) 20 MG tablet, Take 1 tablet (20 mg total) by mouth daily before breakfast., Disp: 30 tablet, Rfl: 0   [START ON 10/10/2022] amphetamine-dextroamphetamine (ADDERALL) 20 MG tablet, Take 1 tablet (20 mg total) by mouth daily before breakfast., Disp: 30 tablet, Rfl: 0   [START ON 11/09/2022] amphetamine-dextroamphetamine (ADDERALL) 20 MG tablet, Take 1 tablet (20 mg total) by mouth daily before breakfast., Disp: 30 tablet, Rfl: 0   buPROPion (WELLBUTRIN XL) 300 MG 24 hr tablet, Take 1 tablet (300 mg total) by mouth daily., Disp: 90 tablet, Rfl: 1   clobetasol (TEMOVATE) 0.05 % external solution, Apply 1 Application topically 2 (two) times daily., Disp: 50 mL, Rfl: 0   levothyroxine (SYNTHROID) 100 MCG tablet, TAKE 1 TAB EVERY MORNING ON AN EMPTY STOMACH WITH WATER ONLY. NO FOOD OR OTHER MEDICATIONS X30 MIN, Disp: 90  tablet, Rfl: 0   ofloxacin (OCUFLOX) 0.3 % ophthalmic solution, Place 1 drop into both eyes 4 (four) times daily for 7 days., Disp: 1.4 mL, Rfl: 0   predniSONE (DELTASONE) 20 MG tablet, Take two tablets po qd for five days, one tablet po qd for five days, then 1/2 tablet po qd for five days, Disp: 18 tablet, Rfl: 0   triamcinolone cream (KENALOG) 0.1 %, Apply 1 Application topically 2 (two) times daily., Disp: 30 g, Rfl: 0      Objective:    BP (!) 146/96   Pulse 88   Temp 98.6 F (37 C) (Temporal)   Ht 5' (1.524 m)   Wt 152 lb (68.9 kg)   SpO2 98%   BMI 29.69 kg/m   Wt Readings from Last 3 Encounters:  09/21/22 152 lb (68.9 kg)  09/14/22 156 lb 8.4 oz (71 kg)  09/10/22 155 lb 12.8 oz (70.7 kg)    Physical Exam  Physical Exam Constitutional:      General: not in acute distress.    Appearance: Normal appearance. normal weight. is not ill-appearing, toxic-appearing or diaphoretic.  Cardiovascular:     Rate and Rhythm: Normal rate.  Pulmonary:     Effort: Pulmonary effort is normal.  Musculoskeletal:        General: Normal range of motion.  Neurological:     General: No focal deficit present.     Mental Status: alert and oriented to person,  place, and time. Mental status is at baseline.  Psychiatric:        Mood and Affect: Mood normal.        Behavior: Behavior normal.        Thought Content: Thought content normal.        Judgment: Judgment normal.        Assessment & Plan:  Acute bacterial conjunctivitis of both eyes Assessment & Plan: Rx ofloxacin eye solution  rx for ofloxacin sent to pt pharmacy, take as prescribed.  Discussed how to wash eye appropriately with warm warm cloth from inner to outer canthus. Wash bed sheets and pillow cases to prevent re-infection. Frequent handwashing. If no improvement in 24-48 hours with eye drops as prescribed, please call office.    Orders: -     Ofloxacin; Place 1 drop into both eyes 4 (four) times daily for 7 days.   Dispense: 1.4 mL; Refill: 0  Primary hypertension Assessment & Plan: Improving Continue amlodipine 10 mg once daily   Pt advised of the following:  Continue medication as prescribed. Monitor blood pressure periodically and/or when you feel symptomatic. Goal is <130/90 on average. Ensure that you have rested for 30 minutes prior to checking your blood pressure. Record your readings and bring them to your next visit if necessary.work on a low sodium diet.    Orders: -     amLODIPine Besylate; Take 1 tablet (10 mg total) by mouth daily.  Dispense: 90 tablet; Refill: 1  Bicuspid aortic valve  Heart murmur     Return in about 3 months (around 12/20/2022) for f/u blood pressure.  Eugenia Pancoast, MSN, APRN, FNP-C [place]

## 2022-09-21 NOTE — Assessment & Plan Note (Signed)
Improving Continue amlodipine 10 mg once daily   Pt advised of the following:  Continue medication as prescribed. Monitor blood pressure periodically and/or when you feel symptomatic. Goal is <130/90 on average. Ensure that you have rested for 30 minutes prior to checking your blood pressure. Record your readings and bring them to your next visit if necessary.work on a low sodium diet.

## 2022-09-21 NOTE — Patient Instructions (Signed)
Continue amlodipine 10 mg once daily.    ------------------------------------  Start monitoring your blood pressure daily, around the same time of day, for the next 2-3 weeks.  Ensure that you have rested for 30 minutes prior to checking your blood pressure. Record your readings and bring them to your next visit.  ------------------------------------   Regards,   Eugenia Pancoast FNP-C

## 2022-09-22 ENCOUNTER — Encounter: Payer: Self-pay | Admitting: Family

## 2022-09-23 ENCOUNTER — Ambulatory Visit: Payer: No Typology Code available for payment source | Admitting: Family

## 2022-09-23 ENCOUNTER — Encounter: Payer: Self-pay | Admitting: Family

## 2022-09-23 VITALS — BP 116/72 | HR 98 | Temp 97.7°F | Ht 60.0 in | Wt 152.8 lb

## 2022-09-23 DIAGNOSIS — M79671 Pain in right foot: Secondary | ICD-10-CM | POA: Insufficient documentation

## 2022-09-23 DIAGNOSIS — M21612 Bunion of left foot: Secondary | ICD-10-CM | POA: Diagnosis not present

## 2022-09-23 DIAGNOSIS — M21611 Bunion of right foot: Secondary | ICD-10-CM | POA: Insufficient documentation

## 2022-09-23 DIAGNOSIS — I10 Essential (primary) hypertension: Secondary | ICD-10-CM

## 2022-09-23 MED ORDER — AMLODIPINE BESYLATE 5 MG PO TABS: 5.0000 mg | ORAL_TABLET | Freq: Every day | ORAL | Status: DC

## 2022-09-23 MED ORDER — VALSARTAN 80 MG PO TABS: 80.0000 mg | ORAL_TABLET | Freq: Every day | ORAL | 3 refills | Status: DC

## 2022-09-23 NOTE — Patient Instructions (Addendum)
A referral was placed today for a podiatrist.  Please let us know if you have not heard back within 2 weeks about the referral.  ------------------------------------  Decrease amlodipine to 5 mg once daily  Start losartan 50 mg once daily.    ------------------------------------  Start monitoring your blood pressure daily, around the same time of day, for the next 2-3 weeks.  Ensure that you have rested for 30 minutes prior to checking your blood pressure. Record your readings and bring them to your next visit.  ------------------------------------   Regards,   Eugenia Pancoast FNP-C

## 2022-09-23 NOTE — Telephone Encounter (Signed)
Just noticed swelling yesterday, no falls, no headaches. I scheduled appt for today.

## 2022-09-23 NOTE — Assessment & Plan Note (Signed)
Decrease amlodipine to 5 mg see if pedal edema resolves Start valsartan 80 mg once daily  Pt advised of the following:  Continue medication as prescribed. Monitor blood pressure periodically and/or when you feel symptomatic. Goal is <130/90 on average. Ensure that you have rested for 30 minutes prior to checking your blood pressure. Record your readings and bring them to your next visit if necessary.work on a low sodium diet.

## 2022-09-23 NOTE — Progress Notes (Signed)
Established Patient Office Visit  Subjective:   Patient ID: Renee [name], female    DOB: 1970-11-12  Age: 52 y.o. MRN: [id number]  CC:  Chief Complaint  Patient presents with   Joint Swelling    Noticed yesterday that ankles and feet swollen     HPI: Renee [name] is a 52 y.o. female presenting on 09/23/2022 for Joint Swelling (Noticed yesterday that ankles and feet swollen )   HPI  HTN: recently noticed swelling bil lower ankles since increasing amlodipine dosage.     ROS: Negative unless specifically indicated above in HPI.   Relevant past medical history reviewed and updated as indicated.   Allergies and medications reviewed and updated.   Current Outpatient Medications:    amLODipine (NORVASC) 5 MG tablet, Take 1 tablet (5 mg total) by mouth daily., Disp: , Rfl:    amphetamine-dextroamphetamine (ADDERALL) 20 MG tablet, Take 1 tablet (20 mg total) by mouth daily before breakfast., Disp: 30 tablet, Rfl: 0   [START ON 10/10/2022] amphetamine-dextroamphetamine (ADDERALL) 20 MG tablet, Take 1 tablet (20 mg total) by mouth daily before breakfast., Disp: 30 tablet, Rfl: 0   [START ON 11/09/2022] amphetamine-dextroamphetamine (ADDERALL) 20 MG tablet, Take 1 tablet (20 mg total) by mouth daily before breakfast., Disp: 30 tablet, Rfl: 0   buPROPion (WELLBUTRIN XL) 300 MG 24 hr tablet, Take 1 tablet (300 mg total) by mouth daily., Disp: 90 tablet, Rfl: 1   clobetasol (TEMOVATE) 0.05 % external solution, Apply 1 Application topically 2 (two) times daily., Disp: 50 mL, Rfl: 0   levothyroxine (SYNTHROID) 100 MCG tablet, TAKE 1 TAB EVERY MORNING ON AN EMPTY STOMACH WITH WATER ONLY. NO FOOD OR OTHER MEDICATIONS X30 MIN, Disp: 90 tablet, Rfl: 0   ofloxacin (OCUFLOX) 0.3 % ophthalmic solution, Place 1 drop into both eyes 4 (four) times daily for 7 days., Disp: 1.4 mL, Rfl: 0   triamcinolone cream (KENALOG) 0.1 %, Apply 1 Application topically 2 (two) times daily., Disp: 30 g, Rfl: 0    valsartan (DIOVAN) 80 MG tablet, Take 1 tablet (80 mg total) by mouth daily., Disp: 90 tablet, Rfl: 3  Allergies  Allergen Reactions   Amlodipine Other (See Comments)    Bil pedal edema at 10 mg dose    Prednisone Palpitations    Objective:   BP 116/72   Pulse 98   Temp 97.7 F (36.5 C) (Temporal)   Ht 5' (1.524 m)   Wt 152 lb 12.8 oz (69.3 kg)   SpO2 98%   BMI 29.84 kg/m    Physical Exam Constitutional:      General: She is not in acute distress.    Appearance: Normal appearance. She is normal weight. She is not ill-appearing, toxic-appearing or diaphoretic.  HENT:     Head: Normocephalic.  Cardiovascular:     Rate and Rhythm: Normal rate.     Comments: Bil non pitting edema  Pulmonary:     Effort: Pulmonary effort is normal.  Musculoskeletal:        General: Normal range of motion.     Right lower leg: Edema present.     Left lower leg: Edema present.     Left foot: Bunion (with slight erythema tenderness to upper base pad of foot as well) present.  Neurological:     General: No focal deficit present.     Mental Status: She is alert and oriented to person, place, and time. Mental status is at baseline.  Psychiatric:  Mood and Affect: Mood normal.        Behavior: Behavior normal.        Thought Content: Thought content normal.        Judgment: Judgment normal.     Assessment & Plan:  Primary hypertension Assessment & Plan: Decrease amlodipine to 5 mg see if pedal edema resolves Start valsartan 80 mg once daily  Pt advised of the following:  Continue medication as prescribed. Monitor blood pressure periodically and/or when you feel symptomatic. Goal is <130/90 on average. Ensure that you have rested for 30 minutes prior to checking your blood pressure. Record your readings and bring them to your next visit if necessary.work on a low sodium diet.   Orders: -     Valsartan; Take 1 tablet (80 mg total) by mouth daily.  Dispense: 90 tablet; Refill: 3 -      amLODIPine Besylate; Take 1 tablet (5 mg total) by mouth daily.  Bunion, left Assessment & Plan: Flare up  Refer to podiatry for eval/treat Ongoing pain   Orders: -     Ambulatory referral to Podiatry     Follow up plan: Return in about 1 month (around 10/22/2022) for f/u blood pressure, f/u with primary care provider if no improvement.  Eugenia Pancoast, FNP

## 2022-09-23 NOTE — Assessment & Plan Note (Signed)
Flare up  Refer to podiatry for eval/treat Ongoing pain

## 2022-09-25 ENCOUNTER — Other Ambulatory Visit: Payer: Self-pay | Admitting: Family

## 2022-09-25 DIAGNOSIS — E890 Postprocedural hypothyroidism: Secondary | ICD-10-CM

## 2022-09-28 ENCOUNTER — Encounter (HOSPITAL_COMMUNITY): Payer: Self-pay | Admitting: Hematology

## 2022-10-12 ENCOUNTER — Encounter: Payer: Self-pay | Admitting: Podiatry

## 2022-10-12 ENCOUNTER — Ambulatory Visit: Payer: PRIVATE HEALTH INSURANCE | Admitting: Podiatry

## 2022-10-12 ENCOUNTER — Ambulatory Visit (INDEPENDENT_AMBULATORY_CARE_PROVIDER_SITE_OTHER): Payer: PRIVATE HEALTH INSURANCE

## 2022-10-12 VITALS — BP 128/80 | HR 87

## 2022-10-12 DIAGNOSIS — M2042 Other hammer toe(s) (acquired), left foot: Secondary | ICD-10-CM | POA: Diagnosis not present

## 2022-10-12 DIAGNOSIS — M2012 Hallux valgus (acquired), left foot: Secondary | ICD-10-CM | POA: Diagnosis not present

## 2022-10-12 NOTE — Progress Notes (Signed)
Chief Complaint  Patient presents with   Foot Pain    "I have a bunion." N - bunion pain  L - left D - 3 yrs O - gradually worse C - throb, sharp pain A - shoes, pressure T - none    Subjective: 52 y.o. female presenting today as a new patient for evaluation of a symptomatic bunion that is been ongoing for several years to the left foot.  Over the past 3 years the pain has exacerbated and patient states that despite shoe gear modifications and different offloading techniques she continues to have pain and tenderness to the bunion site.  She states that she is finally in a position where she is willing to proceed with surgery to correct for the bunion.  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)   Osteopenia 09/01/2014   PTSD (post-traumatic stress disorder) 08/15/2014   Rupture of posterior cruciate ligament of left knee 04/20/2016   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   Thyroid disease    Past Surgical History:  Procedure Laterality Date   BREAST CAPSULECTOMY WITH IMPLANT EXCHANGE Left 08/19/2011   BREAST RECONSTRUCTION Left 01/15/2011   MINOR IRRIGATION AND DEBRIDEMENT OF WOUND Left 03/05/2011   back and left breast   MODIFIED RADICAL MASTECTOMY Left 10/08/2009   PLACEMENT OF BREAST IMPLANTS Right 08/19/2011   PORT-A-CATH REMOVAL  07/15/2010   PORTACATH PLACEMENT  11/14/2009   with left breast wound revision   Allergies  Allergen Reactions   Amlodipine Other (See Comments)    Bil pedal edema at 10 mg dose     Prednisone Palpitations    Objective: Physical Exam General: The patient is alert and oriented x3 in no acute distress.  Dermatology: Skin is cool, dry and supple bilateral lower extremities. Negative for open lesions or macerations.  Vascular: Palpable pedal pulses bilaterally. No edema or erythema noted. Capillary refill within normal limits.  Neurological: Epicritic and protective threshold grossly intact bilaterally.   Musculoskeletal Exam: Clinical evidence of bunion deformity noted to the left foot.  Prominent medial eminence of the first metatarsal head noted.  He is very symptomatic and tender with palpation.  Lateral deviation of the hallux also noted Hammertoe contracture noted with dorsal MTP contracture as well to the second digit left foot  Radiographic Exam LT foot 10/12/2022: Increased intermetatarsal angle greater than 15 with a hallux abductus angle greater than 30 noted on AP view. Contracture deformity also noted to the interphalangeal joints and MPJs of the digits of the second digit left foot   Assessment: 1. HAV w/ bunion deformity left 2. Hammertoe deformity second left   Plan of Care:  1. Patient was evaluated. X-Rays reviewed. 2.  Unfortunately the patient has been dealing with the symptomatic bunion for several years now.  She has tried different conservative modalities including shoe gear modifications with no relief.  She says that she is finally ready to proceed with surgery 3.  Today  we discussed Lapidus type bunionectomy procedure in detail.  As well as the hammertoe correction surgery.  Risk benefits advantages disadvantages as well as the postoperative recovery course were explained in great detail to the patient.  All patient questions were answered.  No guarantees were expressed or implied. 4.  The patient would like to proceed with surgery.  Authorization for surgery was initiated today.  Surgery will consist of Lapidus type bunionectomy left.   Possible second hammertoe arthroplasty with capsulotomy left.  Possible Weil shortening osteotomy second metatarsal left 5.  Patient lives alone but she states that she will be able to stay with her daughter who lives in [place], [place] for the weekend or at least until she is feeling much better.  She also has short-term disability with work and planning to take the appropriate time off to recover from surgery.   6.  Return to clinic 1 week postop  *Medical charting for retirement facility here in [facility], [facility]  Dr. Edrick Kins, [address]                                        [place], [place] [address]                Office [phone]  Fax [phone]

## 2022-10-18 ENCOUNTER — Telehealth: Payer: Self-pay | Admitting: Urology

## 2022-10-18 NOTE — Telephone Encounter (Addendum)
DOS - 11/18/22  LAPIDUS PROCEDURE INCLUDING BUNIONECTOMY LEFT --- [id number] METATARSAL OSTEOTOMY 2ND LEFT --- [id number] HAMMERTOE REPAIR 2ND LEFT --- [id number] CAPSULOTOMY MPJ RELEASE 2ND LEFT --- [id number]  [facility] EFFECTIVE DATE - 08/09/17  DEDUCTIBLE - $5,000.00 W/ $3,762.76 MET OOP - $6,250.00 W/ $3,918.02 MET COINSURANCE - 40%   SPOKE WITH KAREN WITH [employer] AND SHE STATED THAT FOR CPT CODES 29562, 28308, [id number] AND 13086 NO PRIOR AUTH IS REQUIRED.   REF # [id number]

## 2022-10-19 ENCOUNTER — Ambulatory Visit (INDEPENDENT_AMBULATORY_CARE_PROVIDER_SITE_OTHER): Payer: No Typology Code available for payment source | Admitting: Family

## 2022-10-19 ENCOUNTER — Ambulatory Visit (INDEPENDENT_AMBULATORY_CARE_PROVIDER_SITE_OTHER)
Admission: RE | Admit: 2022-10-19 | Discharge: 2022-10-19 | Disposition: A | Discharge status: Home / Self Care | Payer: PRIVATE HEALTH INSURANCE | Source: Ambulatory Visit | Attending: Family | Admitting: Family

## 2022-10-19 ENCOUNTER — Encounter: Payer: Self-pay | Admitting: Family

## 2022-10-19 VITALS — BP 162/98 | HR 98 | Temp 97.6°F | Ht 60.0 in | Wt 152.0 lb

## 2022-10-19 DIAGNOSIS — R059 Cough, unspecified: Secondary | ICD-10-CM

## 2022-10-19 DIAGNOSIS — J40 Bronchitis, not specified as acute or chronic: Secondary | ICD-10-CM | POA: Insufficient documentation

## 2022-10-19 DIAGNOSIS — J0110 Acute frontal sinusitis, unspecified: Secondary | ICD-10-CM | POA: Diagnosis not present

## 2022-10-19 DIAGNOSIS — M79676 Pain in unspecified toe(s): Secondary | ICD-10-CM

## 2022-10-19 DIAGNOSIS — R062 Wheezing: Secondary | ICD-10-CM | POA: Diagnosis not present

## 2022-10-19 LAB — POCT INFLUENZA A/B
Influenza A, POC: NEGATIVE
Influenza B, POC: NEGATIVE

## 2022-10-19 LAB — POC COVID19 BINAXNOW: SARS Coronavirus 2 Ag: NEGATIVE

## 2022-10-19 MED ORDER — BENZONATATE 200 MG PO CAPS: 200.0000 mg | ORAL_CAPSULE | Freq: Two times a day (BID) | ORAL | 0 refills | Status: DC | PRN

## 2022-10-19 MED ORDER — ALBUTEROL SULFATE HFA 108 (90 BASE) MCG/ACT IN AERS: 2.0000 | INHALATION_SPRAY | Freq: Four times a day (QID) | RESPIRATORY_TRACT | 0 refills | Status: DC | PRN

## 2022-10-19 MED ORDER — AMOXICILLIN-POT CLAVULANATE 875-125 MG PO TABS: 1.0000 | ORAL_TABLET | Freq: Two times a day (BID) | ORAL | 0 refills | Status: DC

## 2022-10-19 MED ORDER — GUAIFENESIN-CODEINE 100-10 MG/5ML PO SYRP: 5.0000 mL | ORAL_SOLUTION | Freq: Three times a day (TID) | ORAL | 0 refills | Status: AC | PRN

## 2022-10-19 MED ORDER — ALBUTEROL SULFATE (2.5 MG/3ML) 0.083% IN NEBU: 2.5000 mg | INHALATION_SOLUTION | Freq: Once | RESPIRATORY_TRACT | Status: AC

## 2022-10-19 NOTE — Assessment & Plan Note (Signed)
rx augmentin 875/125 mg po bid x 10 days Suspected bronchitis with bronchospasm, in office nebulizer treatment.  Rx albuterol HFA prn bronchospasm  Rx tessalon perrles 200 mg tid prn for daytime cough  Gua codeine prn at night time (advised not to take with etoh and or other sedative type medication (pdmp reviewed) Pt advised if any worsening of sx with increasing sob please go to er and or call 911.

## 2022-10-19 NOTE — Progress Notes (Signed)
Established Patient Office Visit  Subjective:   Patient ID: Renee [name], female    DOB: [birth year]  Age: 52 y.o. MRN: [id number]  CC:  Chief Complaint  Patient presents with   Cough    HPI: Renee [name] is a 52 y.o. female presenting on 10/19/2022 for Cough   Cough    About two weeks ago started nasal congestion, cough that is productive, and chest congestion. Had to sleep due to the cough. No fever or chills. No sore throat or eat pain, with sinus pressure in the face.   Otc delsyn with mild relief.  She is wheezing also with some sob when coughing.     ROS: Negative unless specifically indicated above in HPI.   Relevant past medical history reviewed and updated as indicated.      Allergies and medications reviewed and updated.   Current Outpatient Medications:    albuterol (VENTOLIN HFA) 108 (90 Base) MCG/ACT inhaler, Inhale 2 puffs into the lungs every 6 (six) hours as needed for wheezing or shortness of breath., Disp: 8 g, Rfl: 0   amLODipine (NORVASC) 5 MG tablet, Take 1 tablet (5 mg total) by mouth daily., Disp: , Rfl:    amoxicillin-clavulanate (AUGMENTIN) 875-125 MG tablet, Take 1 tablet by mouth 2 (two) times daily., Disp: 20 tablet, Rfl: 0   amphetamine-dextroamphetamine (ADDERALL) 20 MG tablet, Take 1 tablet (20 mg total) by mouth daily before breakfast., Disp: 30 tablet, Rfl: 0   amphetamine-dextroamphetamine (ADDERALL) 20 MG tablet, Take 1 tablet (20 mg total) by mouth daily before breakfast., Disp: 30 tablet, Rfl: 0   [START ON 11/09/2022] amphetamine-dextroamphetamine (ADDERALL) 20 MG tablet, Take 1 tablet (20 mg total) by mouth daily before breakfast., Disp: 30 tablet, Rfl: 0   benzonatate (TESSALON) 200 MG capsule, Take 1 capsule (200 mg total) by mouth 2 (two) times daily as needed for cough., Disp: 20 capsule, Rfl: 0   buPROPion (WELLBUTRIN XL) 300 MG 24 hr tablet, Take 1 tablet (300 mg total) by mouth daily., Disp: 90 tablet, Rfl: 1   clobetasol  (TEMOVATE) 0.05 % external solution, Apply 1 Application topically 2 (two) times daily., Disp: 50 mL, Rfl: 0   guaiFENesin-codeine (ROBITUSSIN AC) 100-10 MG/5ML syrup, Take 5 mLs by mouth 3 (three) times daily as needed for up to 5 days for cough., Disp: 75 mL, Rfl: 0   levothyroxine (SYNTHROID) 100 MCG tablet, TAKE 1 TAB EVERY MORNING ON AN EMPTY STOMACH WITH WATER ONLY. NO FOOD OR OTHER MEDICATIONS X30 MIN, Disp: 90 tablet, Rfl: 0   triamcinolone cream (KENALOG) 0.1 %, Apply 1 Application topically 2 (two) times daily., Disp: 30 g, Rfl: 0   valsartan (DIOVAN) 80 MG tablet, Take 1 tablet (80 mg total) by mouth daily., Disp: 90 tablet, Rfl: 3  Current Facility-Administered Medications:    albuterol (PROVENTIL) (2.5 MG/3ML) 0.083% nebulizer solution 2.5 mg, 2.5 mg, Nebulization, Once, Alyssamarie Mounsey, FNP  Allergies  Allergen Reactions   Amlodipine Other (See Comments)    Bil pedal edema at 10 mg dose    Prednisone Palpitations    Objective:   BP (!) 162/98   Pulse 98   Temp 97.6 F (36.4 C) (Temporal)   Ht 5' (1.524 m)   Wt 152 lb (68.9 kg)   SpO2 98%   BMI 29.69 kg/m    Physical Exam Vitals reviewed.  Constitutional:      General: She is not in acute distress.    Appearance: Normal appearance. She is obese.  She is not ill-appearing, toxic-appearing or diaphoretic.  HENT:     Head: Normocephalic.     Right Ear: Tympanic membrane normal.     Left Ear: Tympanic membrane normal.     Nose: Nose normal.     Mouth/Throat:     Mouth: Mucous membranes are dry.     Pharynx: No oropharyngeal exudate or posterior oropharyngeal erythema.  Eyes:     Extraocular Movements: Extraocular movements intact.     Pupils: Pupils are equal, round, and reactive to light.  Cardiovascular:     Rate and Rhythm: Normal rate and regular rhythm.     Pulses: Normal pulses.     Heart sounds: Normal heart sounds.  Pulmonary:     Effort: Pulmonary effort is normal.     Breath sounds: Examination of  the right-upper field reveals decreased breath sounds and wheezing. Decreased breath sounds and wheezing present.  Musculoskeletal:     Cervical back: Normal range of motion.  Neurological:     General: No focal deficit present.     Mental Status: She is alert and oriented to person, place, and time. Mental status is at baseline.  Psychiatric:        Mood and Affect: Mood normal.        Behavior: Behavior normal.        Thought Content: Thought content normal.        Judgment: Judgment normal.     Assessment & Plan:  Cough, unspecified type -     POC COVID-19 BinaxNow -     POCT Influenza A/B -     Amoxicillin-Pot Clavulanate; Take 1 tablet by mouth 2 (two) times daily.  Dispense: 20 tablet; Refill: 0 -     guaiFENesin-Codeine; Take 5 mLs by mouth 3 (three) times daily as needed for up to 5 days for cough.  Dispense: 75 mL; Refill: 0  Wheezing -     Albuterol Sulfate -     Albuterol Sulfate HFA; Inhale 2 puffs into the lungs every 6 (six) hours as needed for wheezing or shortness of breath.  Dispense: 8 g; Refill: 0 -     Benzonatate; Take 1 capsule (200 mg total) by mouth 2 (two) times daily as needed for cough.  Dispense: 20 capsule; Refill: 0 -     guaiFENesin-Codeine; Take 5 mLs by mouth 3 (three) times daily as needed for up to 5 days for cough.  Dispense: 75 mL; Refill: 0  Bronchitis Assessment & Plan: rx augmentin 875/125 mg po bid x 10 days Suspected bronchitis with bronchospasm, in office nebulizer treatment.  Rx albuterol HFA prn bronchospasm  Rx tessalon perrles 200 mg tid prn for daytime cough  Gua codeine prn at night time (advised not to take with etoh and or other sedative type medication (pdmp reviewed) Pt advised if any worsening of sx with increasing sob please go to er and or call 911.   Orders: -     Amoxicillin-Pot Clavulanate; Take 1 tablet by mouth 2 (two) times daily.  Dispense: 20 tablet; Refill: 0  Acute non-recurrent frontal sinusitis -      Amoxicillin-Pot Clavulanate; Take 1 tablet by mouth 2 (two) times daily.  Dispense: 20 tablet; Refill: 0     Follow up plan: Return if symptoms worsen or fail to improve.  Eugenia Pancoast, FNP

## 2022-10-19 NOTE — Patient Instructions (Signed)
Antibiotic sent to preferred pharmacy (Augmentin) Albuterol inhaler to use as needed for shortness of breath/wheezing.  Guaf codeine cough syrup for night time only because can make you drowsy.  Tessalon perrles for daytime cough.   Please increase oral fluids, steamy hot shower/humidifier prn.  Please follow up if no improvement in 2-3 days.   It was a pleasure seeing you today! Please do not hesitate to reach out with any questions and or concerns.  Regards,   Eugenia Pancoast

## 2022-10-20 ENCOUNTER — Encounter: Payer: Self-pay | Admitting: Family

## 2022-10-20 DIAGNOSIS — J9801 Acute bronchospasm: Secondary | ICD-10-CM

## 2022-10-21 MED ORDER — FLUTICASONE-SALMETEROL 250-50 MCG/ACT IN AEPB: 1.0000 | INHALATION_SPRAY | Freq: Two times a day (BID) | RESPIRATORY_TRACT | 0 refills | Status: DC

## 2022-10-22 NOTE — Telephone Encounter (Signed)
Error

## 2022-10-25 ENCOUNTER — Encounter: Payer: Self-pay | Admitting: Family

## 2022-10-25 ENCOUNTER — Encounter (HOSPITAL_COMMUNITY): Payer: Self-pay | Admitting: Hematology

## 2022-10-25 ENCOUNTER — Ambulatory Visit (INDEPENDENT_AMBULATORY_CARE_PROVIDER_SITE_OTHER): Payer: No Typology Code available for payment source | Admitting: Family

## 2022-10-25 VITALS — BP 128/84 | HR 72 | Temp 97.6°F | Ht 60.0 in | Wt 152.8 lb

## 2022-10-25 DIAGNOSIS — J40 Bronchitis, not specified as acute or chronic: Secondary | ICD-10-CM

## 2022-10-25 MED ORDER — LEVOFLOXACIN 500 MG PO TABS: 500.0000 mg | ORAL_TABLET | Freq: Every day | ORAL | 0 refills | Status: AC

## 2022-10-25 NOTE — Progress Notes (Signed)
Established Patient Office Visit  Subjective:   Patient ID: Renee [name], female    DOB: [birth year]  Age: 52 y.o. MRN: [id number]  CC:  Chief Complaint  Patient presents with   Cough    HPI: Renee [name] is a 52 y.o. female presenting on 10/25/2022 for Cough    Cough    Initially seen 10/19/22 for wheezing and cough. Given RX augmentin, guauc codeine cough syrup, albuterol. CXR negative for acute findings.   Reached out, and stated no improvement, so I sent in advair to start daily.   Was taking Claritin and flonase but stopped because it felt like too much.   Today states left ear feels clogged up, and throat hurts on right side with slight wheezing.  Hard to cough because back hurts so much from coughing.         ROS: Negative unless specifically indicated above in HPI.   Relevant past medical history reviewed and updated as indicated.   Allergies and medications reviewed and updated.   Current Outpatient Medications:    albuterol (VENTOLIN HFA) 108 (90 Base) MCG/ACT inhaler, Inhale 2 puffs into the lungs every 6 (six) hours as needed for wheezing or shortness of breath., Disp: 8 g, Rfl: 0   amLODipine (NORVASC) 5 MG tablet, Take 1 tablet (5 mg total) by mouth daily., Disp: , Rfl:    amoxicillin-clavulanate (AUGMENTIN) 875-125 MG tablet, Take 1 tablet by mouth 2 (two) times daily., Disp: 20 tablet, Rfl: 0   amphetamine-dextroamphetamine (ADDERALL) 20 MG tablet, Take 1 tablet (20 mg total) by mouth daily before breakfast., Disp: 30 tablet, Rfl: 0   amphetamine-dextroamphetamine (ADDERALL) 20 MG tablet, Take 1 tablet (20 mg total) by mouth daily before breakfast., Disp: 30 tablet, Rfl: 0   [START ON 11/09/2022] amphetamine-dextroamphetamine (ADDERALL) 20 MG tablet, Take 1 tablet (20 mg total) by mouth daily before breakfast., Disp: 30 tablet, Rfl: 0   benzonatate (TESSALON) 200 MG capsule, Take 1 capsule (200 mg total) by mouth 2 (two) times daily as needed for  cough., Disp: 20 capsule, Rfl: 0   buPROPion (WELLBUTRIN XL) 300 MG 24 hr tablet, Take 1 tablet (300 mg total) by mouth daily., Disp: 90 tablet, Rfl: 1   clobetasol (TEMOVATE) 0.05 % external solution, Apply 1 Application topically 2 (two) times daily., Disp: 50 mL, Rfl: 0   fluticasone-salmeterol (ADVAIR) 250-50 MCG/ACT AEPB, Inhale 1 puff into the lungs in the morning and at bedtime., Disp: 1 each, Rfl: 0   levofloxacin (LEVAQUIN) 500 MG tablet, Take 1 tablet (500 mg total) by mouth daily for 7 days., Disp: 7 tablet, Rfl: 0   levothyroxine (SYNTHROID) 100 MCG tablet, TAKE 1 TAB EVERY MORNING ON AN EMPTY STOMACH WITH WATER ONLY. NO FOOD OR OTHER MEDICATIONS X30 MIN, Disp: 90 tablet, Rfl: 0   triamcinolone cream (KENALOG) 0.1 %, Apply 1 Application topically 2 (two) times daily., Disp: 30 g, Rfl: 0   valsartan (DIOVAN) 80 MG tablet, Take 1 tablet (80 mg total) by mouth daily., Disp: 90 tablet, Rfl: 3  Current Facility-Administered Medications:    albuterol (PROVENTIL) (2.5 MG/3ML) 0.083% nebulizer solution 2.5 mg, 2.5 mg, Nebulization, Once, Sheppard Luckenbach, FNP  Allergies  Allergen Reactions   Amlodipine Other (See Comments)    Bil pedal edema at 10 mg dose    Prednisone Palpitations    Objective:   BP 128/84   Pulse 72   Temp 97.6 F (36.4 C) (Temporal)   Ht 5' (1.524 m)  Wt 152 lb 12.8 oz (69.3 kg)   SpO2 98%   BMI 29.84 kg/m    Physical Exam Vitals reviewed.  Constitutional:      General: She is not in acute distress.    Appearance: Normal appearance. She is normal weight. She is not ill-appearing, toxic-appearing or diaphoretic.  HENT:     Head: Normocephalic.     Right Ear: Tympanic membrane normal.     Left Ear: Tympanic membrane normal.     Nose: Nose normal.     Mouth/Throat:     Mouth: Mucous membranes are dry.     Pharynx: No oropharyngeal exudate or posterior oropharyngeal erythema.  Eyes:     Extraocular Movements: Extraocular movements intact.     Pupils:  Pupils are equal, round, and reactive to light.  Cardiovascular:     Rate and Rhythm: Normal rate and regular rhythm.     Pulses: Normal pulses.     Heart sounds: Normal heart sounds.  Pulmonary:     Effort: Pulmonary effort is normal.     Breath sounds: Decreased air movement present. Examination of the left-lower field reveals wheezing. Wheezing present.  Musculoskeletal:     Cervical back: Normal range of motion.  Neurological:     General: No focal deficit present.     Mental Status: She is alert and oriented to person, place, and time. Mental status is at baseline.  Psychiatric:        Mood and Affect: Mood normal.        Behavior: Behavior normal.        Thought Content: Thought content normal.        Judgment: Judgment normal.     Assessment & Plan:  Bronchitis Assessment & Plan: Treatment failure with augmentin Start levaquin 500 mg once daily for seven days Start advair rx  Resume claritin and flonase.  Take antibiotic as prescribed. Increase oral fluids. Pt to f/u if sx worsen and or fail to improve in 2-3 days.   Orders: -     levoFLOXacin; Take 1 tablet (500 mg total) by mouth daily for 7 days.  Dispense: 7 tablet; Refill: 0     Follow up plan: No follow-ups on file.  Renee Pancoast, FNP

## 2022-10-25 NOTE — Patient Instructions (Signed)
  Sending in new antbx called levaquin.  Start advair daily as prescribed. Restart allergy medication ( flonase and claritin)  Regards,   Courtney Bellizzi FNP-C

## 2022-10-25 NOTE — Assessment & Plan Note (Signed)
Treatment failure with augmentin Start levaquin 500 mg once daily for seven days Start advair rx  Resume claritin and flonase.  Take antibiotic as prescribed. Increase oral fluids. Pt to f/u if sx worsen and or fail to improve in 2-3 days.

## 2022-10-28 ENCOUNTER — Ambulatory Visit (INDEPENDENT_AMBULATORY_CARE_PROVIDER_SITE_OTHER): Payer: No Typology Code available for payment source | Admitting: Family

## 2022-10-28 ENCOUNTER — Encounter: Payer: Self-pay | Admitting: Family

## 2022-10-28 ENCOUNTER — Encounter (HOSPITAL_COMMUNITY): Payer: Self-pay | Admitting: Hematology

## 2022-10-28 VITALS — BP 142/82 | HR 92 | Temp 97.6°F | Ht 60.0 in | Wt 154.8 lb

## 2022-10-28 DIAGNOSIS — J40 Bronchitis, not specified as acute or chronic: Secondary | ICD-10-CM | POA: Diagnosis not present

## 2022-10-28 DIAGNOSIS — R051 Acute cough: Secondary | ICD-10-CM

## 2022-10-28 DIAGNOSIS — R0989 Other specified symptoms and signs involving the circulatory and respiratory systems: Secondary | ICD-10-CM

## 2022-10-28 DIAGNOSIS — R12 Heartburn: Secondary | ICD-10-CM | POA: Diagnosis not present

## 2022-10-28 MED ORDER — OMEPRAZOLE 20 MG PO CPDR: 20.0000 mg | DELAYED_RELEASE_CAPSULE | Freq: Every day | ORAL | 3 refills | Status: DC

## 2022-10-28 MED ORDER — GUAIFENESIN-CODEINE 100-10 MG/5ML PO SYRP: 5.0000 mL | ORAL_SOLUTION | Freq: Three times a day (TID) | ORAL | 0 refills | Status: AC | PRN

## 2022-10-28 MED ORDER — MONTELUKAST SODIUM 10 MG PO TABS: 10.0000 mg | ORAL_TABLET | Freq: Every day | ORAL | 0 refills | Status: DC

## 2022-10-28 NOTE — Progress Notes (Signed)
Established Patient Office Visit  Subjective:   Patient ID: Renee [name], female    DOB: Jan 25, 1971  Age: 52 y.o. MRN: [id number]  CC:  Chief Complaint  Patient presents with   Bronchitis    FOLLOW UP    HPI: Renee [name] is a 52 y.o. female presenting on 10/28/2022 for Bronchitis (FOLLOW UP)   HPI  Comes in today with ongoing cough that is now non-productive.  Did start advair three days ago with slight improvement in symptoms, does use albuterol about twice daily. Does feel sob most of the time especially when coughing. Some slight chest congestion.   Did have slight heartburn the other day. Doesn't take anything regularly for this.  Since starting levaquin on 3/18 and has helped her cough up some stuff with mild improvement. No fever or chills.   Did have cough medication for nighttime but has since run out.      ROS: Negative unless specifically indicated above in HPI.   Relevant past medical history reviewed and updated as indicated.   Allergies and medications reviewed and updated.   Current Outpatient Medications:    albuterol (VENTOLIN HFA) 108 (90 Base) MCG/ACT inhaler, Inhale 2 puffs into the lungs every 6 (six) hours as needed for wheezing or shortness of breath., Disp: 8 g, Rfl: 0   amLODipine (NORVASC) 5 MG tablet, Take 1 tablet (5 mg total) by mouth daily., Disp: , Rfl:    amphetamine-dextroamphetamine (ADDERALL) 20 MG tablet, Take 1 tablet (20 mg total) by mouth daily before breakfast., Disp: 30 tablet, Rfl: 0   amphetamine-dextroamphetamine (ADDERALL) 20 MG tablet, Take 1 tablet (20 mg total) by mouth daily before breakfast., Disp: 30 tablet, Rfl: 0   [START ON 11/09/2022] amphetamine-dextroamphetamine (ADDERALL) 20 MG tablet, Take 1 tablet (20 mg total) by mouth daily before breakfast., Disp: 30 tablet, Rfl: 0   benzonatate (TESSALON) 200 MG capsule, Take 1 capsule (200 mg total) by mouth 2 (two) times daily as needed for cough., Disp: 20 capsule, Rfl:  0   buPROPion (WELLBUTRIN XL) 300 MG 24 hr tablet, Take 1 tablet (300 mg total) by mouth daily., Disp: 90 tablet, Rfl: 1   clobetasol (TEMOVATE) 0.05 % external solution, Apply 1 Application topically 2 (two) times daily., Disp: 50 mL, Rfl: 0   fluticasone-salmeterol (ADVAIR) 250-50 MCG/ACT AEPB, Inhale 1 puff into the lungs in the morning and at bedtime., Disp: 1 each, Rfl: 0   guaiFENesin-codeine (ROBITUSSIN AC) 100-10 MG/5ML syrup, Take 5 mLs by mouth 3 (three) times daily as needed for up to 5 days for cough or congestion., Disp: 75 mL, Rfl: 0   levofloxacin (LEVAQUIN) 500 MG tablet, Take 1 tablet (500 mg total) by mouth daily for 7 days., Disp: 7 tablet, Rfl: 0   levothyroxine (SYNTHROID) 100 MCG tablet, TAKE 1 TAB EVERY MORNING ON AN EMPTY STOMACH WITH WATER ONLY. NO FOOD OR OTHER MEDICATIONS X30 MIN, Disp: 90 tablet, Rfl: 0   montelukast (SINGULAIR) 10 MG tablet, Take 1 tablet (10 mg total) by mouth at bedtime., Disp: 90 tablet, Rfl: 0   omeprazole (PRILOSEC) 20 MG capsule, Take 1 capsule (20 mg total) by mouth daily., Disp: 30 capsule, Rfl: 3   triamcinolone cream (KENALOG) 0.1 %, Apply 1 Application topically 2 (two) times daily., Disp: 30 g, Rfl: 0   valsartan (DIOVAN) 80 MG tablet, Take 1 tablet (80 mg total) by mouth daily., Disp: 90 tablet, Rfl: 3  Current Facility-Administered Medications:    albuterol (PROVENTIL) (2.5  MG/3ML) 0.083% nebulizer solution 2.5 mg, 2.5 mg, Nebulization, Once, Amalea Ottey, FNP  Allergies  Allergen Reactions   Amlodipine Other (See Comments)    Bil pedal edema at 10 mg dose    Prednisone Palpitations    Objective:   BP (!) 142/82   Pulse 92   Temp 97.6 F (36.4 C) (Temporal)   Ht 5' (1.524 m)   Wt 154 lb 12.8 oz (70.2 kg)   SpO2 98%   BMI 30.23 kg/m    Physical Exam Constitutional:      General: She is not in acute distress.    Appearance: Normal appearance. She is normal weight. She is not ill-appearing, toxic-appearing or diaphoretic.   HENT:     Head: Normocephalic.     Right Ear: Tympanic membrane normal.     Left Ear: Tympanic membrane normal.     Nose: Nose normal.     Mouth/Throat:     Mouth: Mucous membranes are dry.     Pharynx: No oropharyngeal exudate or posterior oropharyngeal erythema.  Eyes:     Extraocular Movements: Extraocular movements intact.     Pupils: Pupils are equal, round, and reactive to light.  Cardiovascular:     Rate and Rhythm: Normal rate and regular rhythm.     Pulses: Normal pulses.     Heart sounds: Normal heart sounds.  Pulmonary:     Effort: Pulmonary effort is normal.     Breath sounds: Normal breath sounds.  Musculoskeletal:     Cervical back: Normal range of motion.  Neurological:     General: No focal deficit present.     Mental Status: She is alert and oriented to person, place, and time. Mental status is at baseline.  Psychiatric:        Mood and Affect: Mood normal.        Behavior: Behavior normal.        Thought Content: Thought content normal.        Judgment: Judgment normal.     Assessment & Plan:  Heartburn -     Omeprazole; Take 1 capsule (20 mg total) by mouth daily.  Dispense: 30 capsule; Refill: 3  Acute cough -     guaiFENesin-Codeine; Take 5 mLs by mouth 3 (three) times daily as needed for up to 5 days for cough or congestion.  Dispense: 75 mL; Refill: 0  Chest congestion -     Montelukast Sodium; Take 1 tablet (10 mg total) by mouth at bedtime.  Dispense: 90 tablet; Refill: 0  Bronchitis Assessment & Plan: Cough improving slightly Refill guai codeine for nightime  Start trial singulair to open up airways Trial omperazole as may have GERD component worsening cough Complete levaquin duration of therapy      Follow up plan: No follow-ups on file.  Eugenia Pancoast, FNP

## 2022-10-28 NOTE — Patient Instructions (Addendum)
We are going to add on omeprazole for heartburn.  Continue advair and levaquin.  Night time cough medication as needed. Delsyn during the day.  Start singulair 10 mg nightly new RX .  Can use nebulizer as needed.    Regards,   Eugenia Pancoast FNP-C

## 2022-11-01 NOTE — Assessment & Plan Note (Signed)
Cough improving slightly Refill guai codeine for nightime  Start trial singulair to open up airways Trial omperazole as may have GERD component worsening cough Complete levaquin duration of therapy

## 2022-11-09 ENCOUNTER — Encounter (HOSPITAL_COMMUNITY): Payer: Self-pay | Admitting: Hematology

## 2022-11-10 ENCOUNTER — Other Ambulatory Visit: Payer: Self-pay | Admitting: Family

## 2022-11-10 DIAGNOSIS — F900 Attention-deficit hyperactivity disorder, predominantly inattentive type: Secondary | ICD-10-CM

## 2022-11-15 NOTE — Telephone Encounter (Signed)
Please advise pt to reach out to pharmacy  Should already have refills

## 2022-11-15 NOTE — Telephone Encounter (Signed)
Spoke with the pharmacy and was advised that this medication is on a [employer] and exercise psychologist, as the reason why she could not get this filled. Another type of medication will need to be sent in.

## 2022-11-16 ENCOUNTER — Other Ambulatory Visit: Payer: Self-pay | Admitting: Family

## 2022-11-16 DIAGNOSIS — F900 Attention-deficit hyperactivity disorder, predominantly inattentive type: Secondary | ICD-10-CM

## 2022-11-16 NOTE — Telephone Encounter (Signed)
Please advise pt and inquire if she has ever been on any other medication for Add.  Examples are ritalin and or vyvanse, concerta or strattera.

## 2022-11-17 NOTE — Telephone Encounter (Signed)
Patient has taken Ritalin when she was younger.

## 2022-11-18 ENCOUNTER — Telehealth: Payer: Self-pay

## 2022-11-18 MED ORDER — METHYLPHENIDATE HCL 20 MG PO TABS
20.0000 mg | ORAL_TABLET | Freq: Every day | ORAL | 0 refills | Status: DC
Start: 2022-11-18 — End: 2023-02-09

## 2022-11-18 NOTE — Telephone Encounter (Signed)
PA request received via CMM for Methylphenidate HCl 20MG  tablets  PA has been submitted to [employer] and is pending additional questions/determination   Key: [id number]

## 2022-11-19 ENCOUNTER — Other Ambulatory Visit: Payer: Self-pay | Admitting: Family

## 2022-11-19 DIAGNOSIS — R12 Heartburn: Secondary | ICD-10-CM

## 2022-11-25 ENCOUNTER — Other Ambulatory Visit: Payer: Self-pay | Admitting: Podiatry

## 2022-11-25 DIAGNOSIS — M2012 Hallux valgus (acquired), left foot: Secondary | ICD-10-CM

## 2022-11-25 DIAGNOSIS — M21542 Acquired clubfoot, left foot: Secondary | ICD-10-CM

## 2022-11-25 MED ORDER — OXYCODONE-ACETAMINOPHEN 5-325 MG PO TABS: 1.0000 | ORAL_TABLET | ORAL | 0 refills | Status: DC | PRN

## 2022-11-25 MED ORDER — IBUPROFEN 800 MG PO TABS: 800.0000 mg | ORAL_TABLET | Freq: Three times a day (TID) | ORAL | 1 refills | Status: AC

## 2022-11-25 NOTE — Progress Notes (Signed)
PRN postop 

## 2022-11-26 ENCOUNTER — Encounter: Payer: PRIVATE HEALTH INSURANCE | Admitting: Podiatry

## 2022-11-26 ENCOUNTER — Other Ambulatory Visit: Payer: Self-pay | Admitting: Podiatry

## 2022-11-26 ENCOUNTER — Telehealth: Payer: Self-pay | Admitting: *Deleted

## 2022-11-26 MED ORDER — OXYCODONE-ACETAMINOPHEN 10-325 MG PO TABS: 1.0000 | ORAL_TABLET | ORAL | 0 refills | Status: DC | PRN

## 2022-11-26 NOTE — Progress Notes (Signed)
PRN severe postop pain

## 2022-11-26 NOTE — Progress Notes (Signed)
Spoke with patient.  Severe postop pain.  Remove cam boot.  Loosen Ace wrap.  DC Percocet 5/325 mg.  Prescription for Percocet 10/3 2 5  mg every 4 hours as needed.  Continue Motrin 800 mg 3 times daily. F/u next scheduled appointment  Felecia Shelling, DPM [facility]  Dr. Felecia Shelling, DPM    [address] [place], [place] [address]                Office [phone]  Fax [phone]

## 2022-11-26 NOTE — Telephone Encounter (Signed)
Patient is icing and elevating as instructed, taking pain medicine/ibuprofen, still having pain and aching some of the time,explained to patient to try placing ice pack behind the knee,is doing that with little relief, encouraged her to loosened the ace wrap inside the boot and place back in boot to see if this helps, will try but wanted to know if there was anything else she can do?

## 2022-11-30 ENCOUNTER — Ambulatory Visit (INDEPENDENT_AMBULATORY_CARE_PROVIDER_SITE_OTHER): Payer: PRIVATE HEALTH INSURANCE | Admitting: Podiatry

## 2022-11-30 ENCOUNTER — Ambulatory Visit (INDEPENDENT_AMBULATORY_CARE_PROVIDER_SITE_OTHER): Payer: PRIVATE HEALTH INSURANCE

## 2022-11-30 DIAGNOSIS — Z9889 Other specified postprocedural states: Secondary | ICD-10-CM

## 2022-11-30 DIAGNOSIS — M2012 Hallux valgus (acquired), left foot: Secondary | ICD-10-CM

## 2022-11-30 MED ORDER — DOXYCYCLINE HYCLATE 100 MG PO TABS
100.0000 mg | ORAL_TABLET | Freq: Two times a day (BID) | ORAL | 0 refills | Status: DC
Start: 2022-11-30 — End: 2023-02-09

## 2022-11-30 NOTE — Progress Notes (Signed)
   Chief Complaint  Patient presents with   Bunions    POV # 1 DOS - 11/25/22 --- LAPIDUS BUNIONECTOMY LEFT, POSSIBLE HAMMERTOE REPAIR WITH CAPSULOTOMY 2ND DIGIT LEFT, POSSIBLE WEIL SHORTENING OSTEOTOMY 2ND METATARSAL LEFT, Patient denies any pain, NO N/V/F/C/SOB, some swelling on the top of the foot and toes.    Subjective:  Patient presents today status post Lapidus bunionectomy with hammertoe Weil osteotomy.  DOS: 11/25/2022.  Patient doing well.  No longer taking any pain medications.  WBAT in the cam boot as instructed.  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)   Osteopenia 09/01/2014   PTSD (post-traumatic stress disorder) 08/15/2014   Rupture of posterior cruciate ligament of left knee 04/20/2016   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   Thyroid disease     Past Surgical History:  Procedure Laterality Date   BREAST CAPSULECTOMY WITH IMPLANT EXCHANGE Left 08/19/2011   BREAST RECONSTRUCTION Left 01/15/2011   MINOR IRRIGATION AND DEBRIDEMENT OF WOUND Left 03/05/2011   back and left breast   MODIFIED RADICAL MASTECTOMY Left 10/08/2009   PLACEMENT OF BREAST IMPLANTS Right 08/19/2011   PORT-A-CATH REMOVAL  07/15/2010   PORTACATH PLACEMENT  11/14/2009   with left breast wound revision    Allergies  Allergen Reactions   Amlodipine Other (See Comments)    Bil pedal edema at 10 mg dose    Prednisone Palpitations    Objective/Physical Exam Neurovascular status intact.  Incision  well coapted with sutures intact. No sign of infectious process noted. No dehiscence. No active bleeding noted.  Moderate edema noted to the surgical extremity.  There are some slight erythema around the dorsum of the foot which may be secondary to irritation from the dressings.  Radiographic Exam LT foot 11/30/2022:  Orthopedic hardware and osteotomies sites appear to be stable with routine healing.  Good realignment of the first ray and alignment of the second ray as well.  Assessment: 1. s/p Lapidus bunionectomy and Weil shortening osteotomy second metatarsal left. DOS: 11/25/2022  -Patient evaluated.  X-rays reviewed -Due to the slight erythema over the dorsum of the foot I did call in a prescription for doxycycline 100 mg 2 times daily #20 -Dressings changed.  Clean dry and intact x 1 week -Continue minimal WBAT cam boot -Return to clinic 1 week   Felecia Shelling, DPM [facility]  Dr. Felecia Shelling, DPM    [address] [place], [place] [address]                Office [phone]  Fax [phone]

## 2022-12-03 ENCOUNTER — Encounter: Payer: PRIVATE HEALTH INSURANCE | Admitting: Podiatry

## 2022-12-07 ENCOUNTER — Other Ambulatory Visit: Payer: Self-pay | Admitting: Podiatry

## 2022-12-07 DIAGNOSIS — M2012 Hallux valgus (acquired), left foot: Secondary | ICD-10-CM

## 2022-12-07 DIAGNOSIS — Z9889 Other specified postprocedural states: Secondary | ICD-10-CM

## 2022-12-09 ENCOUNTER — Other Ambulatory Visit (HOSPITAL_COMMUNITY): Payer: Self-pay

## 2022-12-09 ENCOUNTER — Encounter (HOSPITAL_COMMUNITY): Payer: Self-pay | Admitting: Hematology

## 2022-12-09 NOTE — Telephone Encounter (Signed)
Unable to process PA on CMM. Submitted PA via [email or web address].  Prior Auth (EOC) ID: [id number]

## 2022-12-10 ENCOUNTER — Encounter: Payer: PRIVATE HEALTH INSURANCE | Admitting: Podiatry

## 2022-12-14 ENCOUNTER — Ambulatory Visit (INDEPENDENT_AMBULATORY_CARE_PROVIDER_SITE_OTHER): Payer: PRIVATE HEALTH INSURANCE | Admitting: Podiatry

## 2022-12-14 DIAGNOSIS — Z9889 Other specified postprocedural states: Secondary | ICD-10-CM

## 2022-12-14 NOTE — Progress Notes (Signed)
   Chief Complaint  Patient presents with   Routine Post Op    POV # 2 DOS - 11/25/22 --- LAPIDUS BUNIONECTOMY LEFT, POSSIBLE HAMMERTOE REPAIR WITH CAPSULOTOMY 2ND DIGIT LEFT, POSSIBLE WEIL SHORTENING OSTEOTOMY 2ND METATARSAL LEFT    Subjective:  Patient presents today status post Lapidus bunionectomy with hammertoe Weil osteotomy.  DOS: 11/25/2022.  Patient doing well.  No pain.  She is not taking any pain medication.  She continues to be WBAT in the cam boot as instructed.  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)   Osteopenia 09/01/2014   PTSD (post-traumatic stress disorder) 08/15/2014   Rupture of posterior cruciate ligament of left knee 04/20/2016   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   Thyroid disease     Past Surgical History:  Procedure Laterality Date   BREAST CAPSULECTOMY WITH IMPLANT EXCHANGE Left 08/19/2011   BREAST RECONSTRUCTION Left 01/15/2011   MINOR IRRIGATION AND DEBRIDEMENT OF WOUND Left 03/05/2011   back and left breast   MODIFIED RADICAL MASTECTOMY Left 10/08/2009   PLACEMENT OF BREAST IMPLANTS Right 08/19/2011   PORT-A-CATH REMOVAL  07/15/2010   PORTACATH PLACEMENT  11/14/2009   with left breast wound revision    Allergies  Allergen Reactions   Amlodipine Other (See Comments)    Bil pedal edema at 10 mg dose    Prednisone Palpitations    Objective/Physical Exam Neurovascular status intact.  Incision well coapted  with sutures intact. No sign of infectious process noted. No dehiscence. No active bleeding noted.  Moderate edema noted to the surgical extremity.  Erythema appears to be resolved over the dorsum of the foot  Radiographic Exam LT foot 11/30/2022:  Orthopedic hardware and osteotomies sites appear to be stable with routine healing.  Good realignment of the first ray and alignment of the second ray as well.  Assessment: 1. s/p Lapidus bunionectomy and Weil shortening osteotomy second metatarsal left. DOS: 11/25/2022  -Patient evaluated.  Sutures removed - Patient completed the oral doxycycline -Patient may begin washing and showering getting the foot wet.  Recommend triple antibiotic and a nonadherent gauze pad with an Ace wrap daily.  Supplies provided -Continue WBAT cam boot -Return to clinic 2 weeks follow-up x-ray   Felecia Shelling, DPM [facility]  Dr. Felecia Shelling, DPM    [address], [place] [address]                Office [phone]  Fax [phone]    [facility]

## 2022-12-17 ENCOUNTER — Encounter: Payer: PRIVATE HEALTH INSURANCE | Admitting: Podiatry

## 2022-12-19 ENCOUNTER — Other Ambulatory Visit: Payer: Self-pay | Admitting: Family

## 2022-12-19 DIAGNOSIS — I10 Essential (primary) hypertension: Secondary | ICD-10-CM

## 2022-12-24 ENCOUNTER — Ambulatory Visit: Payer: PRIVATE HEALTH INSURANCE | Admitting: Podiatry

## 2022-12-24 ENCOUNTER — Encounter: Payer: Self-pay | Admitting: Podiatry

## 2022-12-24 ENCOUNTER — Ambulatory Visit (INDEPENDENT_AMBULATORY_CARE_PROVIDER_SITE_OTHER): Payer: PRIVATE HEALTH INSURANCE

## 2022-12-24 DIAGNOSIS — M2012 Hallux valgus (acquired), left foot: Secondary | ICD-10-CM

## 2022-12-24 DIAGNOSIS — Z9889 Other specified postprocedural states: Secondary | ICD-10-CM

## 2022-12-24 NOTE — Progress Notes (Signed)
   Chief Complaint  Patient presents with   Routine Post Op      POV # 3 DOS - 11/25/22 --- LAPIDUS BUNIONECTOMY LEFT, POSSIBLE HAMMERTOE REPAIR WITH CAPSULOTOMY 2ND DIGIT LEFT, POSSIBLE WEIL SHORTENING OSTEOTOMY 2ND METATARSAL LEFT         Subjective:  Patient presents today status post Lapidus bunionectomy with hammertoe Weil osteotomy.  DOS: 11/25/2022.  Patient doing well.  Again no pain.  Patient does admit to walking around the house barefoot.  She has noticed increased swelling due to her increased activity.  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)   Osteopenia 09/01/2014   PTSD (post-traumatic stress disorder) 08/15/2014   Rupture of posterior cruciate ligament of left knee 04/20/2016   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   Thyroid disease     Past Surgical History:  Procedure Laterality Date   BREAST CAPSULECTOMY WITH IMPLANT EXCHANGE Left 08/19/2011   BREAST RECONSTRUCTION Left 01/15/2011   MINOR IRRIGATION AND DEBRIDEMENT OF WOUND Left 03/05/2011   back and left breast   MODIFIED RADICAL MASTECTOMY Left 10/08/2009   PLACEMENT OF BREAST IMPLANTS Right 08/19/2011   PORT-A-CATH REMOVAL  07/15/2010   PORTACATH PLACEMENT  11/14/2009   with left breast wound revision    Allergies  Allergen Reactions   Amlodipine Other (See Comments)    Bil pedal edema at 10 mg dose    Prednisone Palpitations    Objective/Physical Exam Neurovascular status intact.  Incision well coapted and healed with some  superficial abrasion around the incision site.  Again this is very superficial and appears stable and should heal routinely with appropriate care  Radiographic Exam LT foot 12/24/2022:  Essentially unchanged.  Orthopedic hardware and osteotomies sites appear to be stable with routine healing.  Good realignment of the first ray and alignment of the second ray as well.  Assessment: 1. s/p Lapidus bunionectomy and Weil shortening osteotomy second metatarsal left. DOS: 11/25/2022  -Patient evaluated.  -Compression ankle sleeves dispensed.  Wear daily to help reduce the edema -Advised against going barefoot.  Recommend the patient make a slow transition out of the cam walker into good supportive tennis shoes over the following 3-4 weeks -Continue to refrain from work. -Recommend triple antibiotic and a light dressing to the incision site to prevent any irritation from shoes or the compression sleeve  -Return to clinic 6 weeks follow-up x-ray and reevaluation  Felecia Shelling, DPM [facility]  Dr. Felecia Shelling, DPM    [address] [place] [address]                Office [phone]  Fax [phone]    [facility]

## 2022-12-27 ENCOUNTER — Encounter (HOSPITAL_COMMUNITY): Payer: Self-pay | Admitting: Hematology

## 2022-12-30 ENCOUNTER — Telehealth: Payer: Self-pay | Admitting: *Deleted

## 2022-12-30 NOTE — Telephone Encounter (Signed)
"  I brought leave paperwork by here last week.  I've been calling to see if they are ready but no one has called me back."  I will send Marylene Land a message and have her call you back.  "That's fine."

## 2023-01-18 ENCOUNTER — Encounter: Payer: Self-pay | Admitting: Podiatry

## 2023-01-20 ENCOUNTER — Encounter: Payer: Self-pay | Admitting: Podiatry

## 2023-01-23 ENCOUNTER — Other Ambulatory Visit: Payer: Self-pay | Admitting: Family

## 2023-01-23 DIAGNOSIS — R0989 Other specified symptoms and signs involving the circulatory and respiratory systems: Secondary | ICD-10-CM

## 2023-01-28 ENCOUNTER — Encounter: Payer: Self-pay | Admitting: Podiatry

## 2023-01-28 ENCOUNTER — Ambulatory Visit (INDEPENDENT_AMBULATORY_CARE_PROVIDER_SITE_OTHER): Payer: PRIVATE HEALTH INSURANCE

## 2023-01-28 ENCOUNTER — Ambulatory Visit (INDEPENDENT_AMBULATORY_CARE_PROVIDER_SITE_OTHER): Payer: PRIVATE HEALTH INSURANCE | Admitting: Podiatry

## 2023-01-28 VITALS — BP 134/88 | HR 89

## 2023-01-28 DIAGNOSIS — Z9889 Other specified postprocedural states: Secondary | ICD-10-CM

## 2023-01-28 NOTE — Progress Notes (Signed)
   No chief complaint on file.   Subjective:  Patient presents today status post Lapidus bunionectomy with hammertoe Weil osteotomy.  DOS: 11/25/2022.  Patient doing well.  No pain.  She is in tennis shoes.  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)   Osteopenia 09/01/2014   PTSD (post-traumatic stress disorder) 08/15/2014   Rupture of posterior cruciate ligament of left knee 04/20/2016   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   Thyroid disease     Past Surgical History:  Procedure Laterality Date   BREAST CAPSULECTOMY WITH IMPLANT EXCHANGE Left 08/19/2011   BREAST RECONSTRUCTION Left 01/15/2011   MINOR IRRIGATION AND DEBRIDEMENT OF WOUND Left 03/05/2011   back and left breast   MODIFIED RADICAL MASTECTOMY Left 10/08/2009   PLACEMENT OF BREAST IMPLANTS Right 08/19/2011   PORT-A-CATH REMOVAL  07/15/2010   PORTACATH PLACEMENT  11/14/2009   with left breast wound revision    Allergies  Allergen Reactions   Amlodipine Other (See Comments)    Bil pedal edema at 10 mg dose    Prednisone Palpitations    Objective/Physical Exam Neurovascular status intact.  Incision nicely healed.  There continues to be moderate edema which is expected given the patient's activity level.  Radiographic Exam LT foot 01/27/2021 for:  Orthopedic hardware and osteotomies sites appear to be stable with routine healing.  Good realignment of the first ray and alignment of the second ray as well.  There is some radiolucency to  the head of the first metatarsal concerning for possible stress reaction fracture at the head of the metatarsal. Simply observe for now  Assessment: 1. s/p Lapidus bunionectomy and Weil shortening osteotomy second metatarsal left. DOS: 11/25/2022  -Patient evaluated.  - Patient has been full activity with no pain or tenderness associated to the foot. -Recommend good supportive tennis shoes and sneakers -Recommend compression ankle sleeve daily -Return to clinic as needed  Felecia Shelling, DPM [facility]  Dr. Felecia Shelling, DPM    [address], [place] [address]                Office [phone]  Fax [phone]    [facility]

## 2023-02-02 ENCOUNTER — Other Ambulatory Visit: Payer: Self-pay | Admitting: Family

## 2023-02-02 DIAGNOSIS — F900 Attention-deficit hyperactivity disorder, predominantly inattentive type: Secondary | ICD-10-CM

## 2023-02-03 ENCOUNTER — Other Ambulatory Visit: Payer: Self-pay | Admitting: Family

## 2023-02-03 DIAGNOSIS — F900 Attention-deficit hyperactivity disorder, predominantly inattentive type: Secondary | ICD-10-CM

## 2023-02-03 NOTE — Telephone Encounter (Signed)
Pt has to have been seen for office visit in the last three months legally in the state of [place] to refill ADD medication due to controlled substance. Pt to make appt to get refill, we can do virtual to make it easier if she prefers.

## 2023-02-04 ENCOUNTER — Encounter: Payer: PRIVATE HEALTH INSURANCE | Admitting: Podiatry

## 2023-02-07 NOTE — Telephone Encounter (Signed)
Called and spoke with patient, scheduled virtual f/u appt 07/03 10:40

## 2023-02-09 ENCOUNTER — Encounter: Payer: Self-pay | Admitting: Family

## 2023-02-09 ENCOUNTER — Telehealth (INDEPENDENT_AMBULATORY_CARE_PROVIDER_SITE_OTHER): Payer: PRIVATE HEALTH INSURANCE | Admitting: Family

## 2023-02-09 VITALS — Ht 60.0 in | Wt 154.0 lb

## 2023-02-09 DIAGNOSIS — F3341 Major depressive disorder, recurrent, in partial remission: Secondary | ICD-10-CM

## 2023-02-09 DIAGNOSIS — F324 Major depressive disorder, single episode, in partial remission: Secondary | ICD-10-CM

## 2023-02-09 DIAGNOSIS — F900 Attention-deficit hyperactivity disorder, predominantly inattentive type: Secondary | ICD-10-CM | POA: Diagnosis not present

## 2023-02-09 DIAGNOSIS — F331 Major depressive disorder, recurrent, moderate: Secondary | ICD-10-CM

## 2023-02-09 MED ORDER — METHYLPHENIDATE HCL 20 MG PO TABS
20.0000 mg | ORAL_TABLET | Freq: Every day | ORAL | 0 refills | Status: DC
Start: 2023-05-12 — End: 2023-07-13

## 2023-02-09 MED ORDER — METHYLPHENIDATE HCL 20 MG PO TABS
20.0000 mg | ORAL_TABLET | Freq: Every day | ORAL | 0 refills | Status: DC
Start: 2023-02-09 — End: 2023-09-02

## 2023-02-09 MED ORDER — METHYLPHENIDATE HCL 20 MG PO TABS: 20.0000 mg | ORAL_TABLET | Freq: Every day | ORAL | 0 refills | Status: DC

## 2023-02-09 MED ORDER — BUPROPION HCL ER (XL) 300 MG PO TB24: 300.0000 mg | ORAL_TABLET | Freq: Every day | ORAL | 3 refills | Status: DC

## 2023-02-09 NOTE — Progress Notes (Addendum)
Virtual Visit via Video note  I connected with Renee [name] on 02/17/23 at home by video and verified that I am speaking with the correct person using two identifiers.The provider, Mort Sawyers, FNP is located in their office at time of visit.  I discussed the limitations, risks, security and privacy concerns of performing an evaluation and management service by video and the availability of in person appointments. I also discussed with the patient that there may be a patient responsible charge related to this service. The patient expressed understanding and agreed to proceed.  Subjective: PCP: Mort Sawyers, FNP  Chief Complaint  Patient presents with   Medical Management of Chronic Issues    HPI  ADD: doing well on ritalin 20 mg once daily.  Helping focus at work and stay on track with her job tasks . Last time filled was in April.  No cp palp or sob  Tolerating well.      ROS: Per HPI  Current Outpatient Medications:    amLODipine (NORVASC) 5 MG tablet, TAKE 1 TABLET (5 MG TOTAL) BY MOUTH DAILY., Disp: 90 tablet, Rfl: 3   clobetasol (TEMOVATE) 0.05 % external solution, Apply 1 Application topically 2 (two) times daily., Disp: 50 mL, Rfl: 0   ibuprofen (ADVIL) 800 MG tablet, Take 1 tablet (800 mg total) by mouth 3 (three) times daily., Disp: 90 tablet, Rfl: 1   levothyroxine (SYNTHROID) 100 MCG tablet, TAKE 1 TAB EVERY MORNING ON AN EMPTY STOMACH WITH WATER ONLY. NO FOOD OR OTHER MEDICATIONS X30 MIN, Disp: 90 tablet, Rfl: 0   montelukast (SINGULAIR) 10 MG tablet, TAKE 1 TABLET BY MOUTH EVERYDAY AT BEDTIME, Disp: 90 tablet, Rfl: 3   omeprazole (PRILOSEC) 20 MG capsule, TAKE 1 CAPSULE BY MOUTH EVERY DAY, Disp: 90 capsule, Rfl: 1   valsartan (DIOVAN) 80 MG tablet, Take 1 tablet (80 mg total) by mouth daily., Disp: 90 tablet, Rfl: 3   buPROPion (WELLBUTRIN XL) 300 MG 24 hr tablet, Take 1 tablet (300 mg total) by mouth daily., Disp: 90 tablet, Rfl: 3   methylphenidate  (RITALIN) 20 MG tablet, Take 1 tablet (20 mg total) by mouth daily., Disp: 30 tablet, Rfl: 0   [START ON 03/12/2023] methylphenidate (RITALIN) 20 MG tablet, Take 1 tablet (20 mg total) by mouth daily., Disp: 30 tablet, Rfl: 0   [START ON 05/12/2023] methylphenidate (RITALIN) 20 MG tablet, Take 1 tablet (20 mg total) by mouth daily., Disp: 30 tablet, Rfl: 0  Current Facility-Administered Medications:    albuterol (PROVENTIL) (2.5 MG/3ML) 0.083% nebulizer solution 2.5 mg, 2.5 mg, Nebulization, Once, Cledith Abdou, FNP  Observations/Objective: Physical Exam Constitutional:      General: She is not in acute distress.    Appearance: Normal appearance. She is not ill-appearing.  Pulmonary:     Effort: Pulmonary effort is normal.  Neurological:     General: No focal deficit present.     Mental Status: She is alert and oriented to person, place, and time.  Psychiatric:        Mood and Affect: Mood normal.        Behavior: Behavior normal.        Thought Content: Thought content normal.     Assessment and Plan: Attention deficit hyperactivity disorder (ADHD), predominantly inattentive type Assessment & Plan: Stable Refill ritalin 20 mg once daily  Pdmp reviewed.    Orders: -     Methylphenidate HCl; Take 1 tablet (20 mg total) by mouth daily.  Dispense: 30 tablet;  Refill: 0 -     Methylphenidate HCl; Take 1 tablet (20 mg total) by mouth daily.  Dispense: 30 tablet; Refill: 0 -     Methylphenidate HCl; Take 1 tablet (20 mg total) by mouth daily.  Dispense: 30 tablet; Refill: 0  Recurrent major depressive disorder, in partial remission (HCC) -     buPROPion HCl ER (XL); Take 1 tablet (300 mg total) by mouth daily.  Dispense: 90 tablet; Refill: 3    Follow Up Instructions: Return in about 3 months (around 05/12/2023) for f/u ADD medication.   I discussed the assessment and treatment plan with the patient. The patient was provided an opportunity to ask questions and all were answered.  The patient agreed with the plan and demonstrated an understanding of the instructions.   The patient was advised to call back or seek an in-person evaluation if the symptoms worsen or if the condition fails to improve as anticipated.  The above assessment and management plan was discussed with the patient. The patient verbalized understanding of and has agreed to the management plan. Patient is aware to call the clinic if symptoms persist or worsen. Patient is aware when to return to the clinic for a follow-up visit. Patient educated on when it is appropriate to go to the emergency department.     Mort Sawyers, MSN, APRN, FNP-C [facility]

## 2023-02-09 NOTE — Assessment & Plan Note (Signed)
Stable Refill ritalin 20 mg once daily  Pdmp reviewed.

## 2023-02-09 NOTE — Assessment & Plan Note (Signed)
Refill sent for wellbutrin 300 mg XL once daily

## 2023-02-17 DIAGNOSIS — F3341 Major depressive disorder, recurrent, in partial remission: Secondary | ICD-10-CM | POA: Insufficient documentation

## 2023-03-09 ENCOUNTER — Other Ambulatory Visit: Payer: Self-pay | Admitting: Family

## 2023-03-09 DIAGNOSIS — E890 Postprocedural hypothyroidism: Secondary | ICD-10-CM

## 2023-04-18 ENCOUNTER — Other Ambulatory Visit: Payer: Self-pay | Admitting: Family

## 2023-04-18 DIAGNOSIS — R21 Rash and other nonspecific skin eruption: Secondary | ICD-10-CM

## 2023-07-13 ENCOUNTER — Ambulatory Visit: Payer: No Typology Code available for payment source | Admitting: General Practice

## 2023-07-13 ENCOUNTER — Encounter: Payer: Self-pay | Admitting: General Practice

## 2023-07-13 VITALS — BP 138/86 | HR 85 | Temp 99.5°F | Ht 60.0 in | Wt 155.0 lb

## 2023-07-13 DIAGNOSIS — J0110 Acute frontal sinusitis, unspecified: Secondary | ICD-10-CM

## 2023-07-13 DIAGNOSIS — R051 Acute cough: Secondary | ICD-10-CM

## 2023-07-13 LAB — POC INFLUENZA A&B (BINAX/QUICKVUE)
Influenza A, POC: NEGATIVE
Influenza B, POC: NEGATIVE

## 2023-07-13 MED ORDER — AMOXICILLIN-POT CLAVULANATE 875-125 MG PO TABS
1.0000 | ORAL_TABLET | Freq: Two times a day (BID) | ORAL | 0 refills | Status: DC
Start: 2023-07-13 — End: 2023-09-01

## 2023-07-13 NOTE — Progress Notes (Signed)
Established Patient Office Visit  Subjective   Patient ID: Renee [name], female    DOB: 1971/05/27  Age: 52 y.o. MRN: [id number]  Chief Complaint  Patient presents with   Headache    Sore throat, cough, body aches, sinus pressure x 2 days. Taking tylenol cold and cough and a nasal saline rinse. Took covid test today and was negative.     Headache  Associated symptoms include coughing and a sore throat. Pertinent negatives include no dizziness, ear pain, fever, nausea or vomiting.   Renee [name] is a 52 year old female, patient of Tabitha Dugal NP, presents today for an acute visit to discuss sore throat, cough, body aches, sinus pressure for 2 days.  She has sinus pain and pressure maxillary and frontal bilaterally along with.  She states that she has been blowing her nose a lot and coughing up green phlegm.  She does sound hoarse today.  She has not checked her fevers at home. she did do a COVID test today which was negative she has tried Tylenol Cold and cough along with saline nasal spray, both with minimal relief.  She works at a nursing home.  She has not had the flu shot this season.  No history of smoking or asthma.       07/13/2023    2:48 PM 09/23/2022   12:24 PM 09/21/2022    7:25 AM  Depression screen PHQ 2/9  Decreased Interest 0 0 0  Down, Depressed, Hopeless 0 0 0  PHQ - 2 Score 0 0 0  Altered sleeping 1 0 2  Tired, decreased energy 1 0 2  Change in appetite 1 0 2  Feeling bad or failure about yourself  0 0 0  Trouble concentrating 0 0 0  Moving slowly or fidgety/restless 0 0 0  Suicidal thoughts 0 0 0  PHQ-9 Score 3 0 6  Difficult doing work/chores Not difficult at all Not difficult at all Somewhat difficult       07/13/2023    2:48 PM 09/23/2022   12:24 PM 09/21/2022    7:25 AM 09/10/2022    9:56 AM  GAD 7 : Generalized Anxiety Score  Nervous, Anxious, on Edge 0 1 1 1   Control/stop worrying 0 1 1 0  Worry too much - different things 0 1 1 0  Trouble relaxing  1 1 1  0  Restless 0 1 1 0  Easily annoyed or irritable 0 1 1 0  Afraid - awful might happen 0 0 1 0  Total GAD 7 Score 1 6 7 1   Anxiety Difficulty Not difficult at all Not difficult at all Somewhat difficult Not difficult at all      Review of Systems  Constitutional:  Positive for chills and malaise/fatigue. Negative for fever.  HENT:  Positive for congestion, sinus pain and sore throat. Negative for ear pain.   Respiratory:  Positive for cough and wheezing.   Cardiovascular:  Negative for chest pain.  Gastrointestinal:  Negative for nausea and vomiting.  Neurological:  Positive for headaches. Negative for dizziness.      Objective:     BP 138/86 (BP Location: Left Arm, Patient Position: Sitting, Cuff Size: Normal)   Pulse 85   Temp 99.5 F (37.5 C) (Oral)   Ht 5' (1.524 m)   Wt 155 lb (70.3 kg)   SpO2 96%   BMI 30.27 kg/m     Physical Exam Vitals and nursing note reviewed.  Constitutional:  Appearance: Normal appearance.  HENT:     Right Ear: Tympanic membrane, ear canal and external ear normal.     Left Ear: Tympanic membrane, ear canal and external ear normal.     Nose: Congestion and rhinorrhea present.     Right Sinus: Maxillary sinus tenderness and frontal sinus tenderness present.     Left Sinus: Maxillary sinus tenderness and frontal sinus tenderness present.     Mouth/Throat:     Mouth: Mucous membranes are moist.  Cardiovascular:     Rate and Rhythm: Normal rate and regular rhythm.     Pulses: Normal pulses.     Heart sounds: Normal heart sounds.  Pulmonary:     Effort: Pulmonary effort is normal.     Breath sounds: Normal breath sounds.  Skin:    General: Skin is warm.  Neurological:     General: No focal deficit present.     Mental Status: She is alert and oriented to person, place, and time.  Psychiatric:        Mood and Affect: Mood normal.        Behavior: Behavior normal.      Results for orders placed or performed in visit on  07/13/23  POC Influenza A&B (Binax test)  Result Value Ref Range   Influenza A, POC Negative Negative   Influenza B, POC Negative Negative       The ASCVD Risk score (Arnett DK, et al., 2019) failed to calculate for the following reasons:   The valid HDL cholesterol range is 20 to 100 mg/dL    Assessment & Plan:  Acute cough -     POC Influenza A&B(BINAX/QUICKVUE)  Acute non-recurrent frontal sinusitis Assessment & Plan: Flu A & B negative.  Given presentation and symptoms of patient, will go ahead and treat for bacterial sinusitis.   Start Augmentin antibiotics. Take 1 tablet by mouth twice daily for 7 days.  Note given for work.   Recommendations given to the patient for OTC medications for other symptoms management.   She will update or call if symptoms do not improve or worsen.   Orders: -     Amoxicillin-Pot Clavulanate; Take 1 tablet by mouth 2 (two) times daily.  Dispense: 20 tablet; Refill: 0     Return if symptoms worsen or fail to improve.    Modesto Charon, NP

## 2023-07-13 NOTE — Assessment & Plan Note (Addendum)
Flu A & B negative.  Given presentation and symptoms of patient, will go ahead and treat for bacterial sinusitis.   Start Augmentin antibiotics. Take 1 tablet by mouth twice daily for 7 days.  Note given for work.   Recommendations given to the patient for OTC medications for other symptoms management.   She will update or call if symptoms do not improve or worsen.

## 2023-07-13 NOTE — Patient Instructions (Signed)
Start Augmentin antibiotics. Take 1 tablet by mouth twice daily for 7 days.  You can try a few things over the counter to help with your symptoms including:  Cough: Delsym or Robitussin (get the off brand, works just as well) Chest Congestion: Mucinex (plain) Nasal Congestion/Ear Pressure/Sinus Pressure: Try using Flonase (fluticasone) nasal spray. Instill 1 spray in each nostril twice daily. This can be purchased over the counter. Body aches, fevers, headache: Ibuprofen (not to exceed 2400 mg in 24 hours) or Acetaminophen-Tylenol (not to exceed 3000 mg in 24 hours) Runny Nose/Throat Drainage/Sneezing/Itchy or Watery Eyes: An antihistamine such as Zyrtec, Claritin, Xyzal, Allegra  Please update if your symptoms do not improve and worsen.   It was a pleasure meeting you!

## 2023-07-23 ENCOUNTER — Other Ambulatory Visit: Payer: Self-pay | Admitting: Family

## 2023-07-23 DIAGNOSIS — R12 Heartburn: Secondary | ICD-10-CM

## 2023-08-16 ENCOUNTER — Other Ambulatory Visit: Payer: Self-pay | Admitting: Family

## 2023-08-16 DIAGNOSIS — E890 Postprocedural hypothyroidism: Secondary | ICD-10-CM

## 2023-08-16 DIAGNOSIS — F900 Attention-deficit hyperactivity disorder, predominantly inattentive type: Secondary | ICD-10-CM

## 2023-08-17 MED ORDER — LEVOTHYROXINE SODIUM 100 MCG PO TABS
ORAL_TABLET | ORAL | 0 refills | Status: DC
Start: 2023-08-17 — End: 2023-09-01

## 2023-08-17 NOTE — Telephone Encounter (Signed)
 Pt needs appt for ritalin I can refill thyroid med.  Legally state of [place] and per the [place] pt needs to have been seen within the last three months for a stimulant medication refill since it is a scheduled 2

## 2023-08-28 ENCOUNTER — Other Ambulatory Visit: Payer: Self-pay | Admitting: Family

## 2023-08-28 DIAGNOSIS — F900 Attention-deficit hyperactivity disorder, predominantly inattentive type: Secondary | ICD-10-CM

## 2023-08-28 DIAGNOSIS — E890 Postprocedural hypothyroidism: Secondary | ICD-10-CM

## 2023-08-28 DIAGNOSIS — L259 Unspecified contact dermatitis, unspecified cause: Secondary | ICD-10-CM

## 2023-08-29 ENCOUNTER — Encounter: Payer: Self-pay | Admitting: Family

## 2023-08-30 ENCOUNTER — Encounter (HOSPITAL_COMMUNITY): Payer: Self-pay | Admitting: Hematology

## 2023-09-01 ENCOUNTER — Ambulatory Visit: Payer: BC Managed Care – PPO | Admitting: Family

## 2023-09-01 ENCOUNTER — Encounter: Payer: Self-pay | Admitting: Family

## 2023-09-01 ENCOUNTER — Other Ambulatory Visit: Payer: Self-pay | Admitting: Family

## 2023-09-01 VITALS — BP 134/86 | HR 72 | Temp 98.3°F | Ht 60.0 in | Wt 156.2 lb

## 2023-09-01 DIAGNOSIS — E559 Vitamin D deficiency, unspecified: Secondary | ICD-10-CM

## 2023-09-01 DIAGNOSIS — E890 Postprocedural hypothyroidism: Secondary | ICD-10-CM | POA: Diagnosis not present

## 2023-09-01 DIAGNOSIS — I10 Essential (primary) hypertension: Secondary | ICD-10-CM

## 2023-09-01 DIAGNOSIS — Z79899 Other long term (current) drug therapy: Secondary | ICD-10-CM

## 2023-09-01 DIAGNOSIS — R41840 Attention and concentration deficit: Secondary | ICD-10-CM

## 2023-09-01 DIAGNOSIS — F900 Attention-deficit hyperactivity disorder, predominantly inattentive type: Secondary | ICD-10-CM

## 2023-09-01 DIAGNOSIS — E538 Deficiency of other specified B group vitamins: Secondary | ICD-10-CM | POA: Diagnosis not present

## 2023-09-01 DIAGNOSIS — Z853 Personal history of malignant neoplasm of breast: Secondary | ICD-10-CM

## 2023-09-01 DIAGNOSIS — E782 Mixed hyperlipidemia: Secondary | ICD-10-CM | POA: Diagnosis not present

## 2023-09-01 DIAGNOSIS — Z8669 Personal history of other diseases of the nervous system and sense organs: Secondary | ICD-10-CM

## 2023-09-01 LAB — LIPID PANEL
Cholesterol: 258 mg/dL — ABNORMAL HIGH (ref 0–200)
HDL: 101.5 mg/dL (ref >39.00)
LDL Cholesterol: 141 mg/dL — ABNORMAL HIGH (ref 0–99)
NonHDL: 156.52
Total CHOL/HDL Ratio: 3
Triglycerides: 80 mg/dL (ref 0.0–149.0)
VLDL: 16 mg/dL (ref 0.0–40.0)

## 2023-09-01 LAB — VITAMIN D 25 HYDROXY (VIT D DEFICIENCY, FRACTURES): VITD: 44.49 ng/mL (ref 30.00–100.00)

## 2023-09-01 LAB — BASIC METABOLIC PANEL
BUN: 17 mg/dL (ref 6–23)
CO2: 27 meq/L (ref 19–32)
Calcium: 9.6 mg/dL (ref 8.4–10.5)
Chloride: 101 meq/L (ref 96–112)
Creatinine, Ser: 0.85 mg/dL (ref 0.40–1.20)
GFR: 77.95 mL/min (ref >60.00)
Glucose, Bld: 86 mg/dL (ref 70–99)
Potassium: 4.8 meq/L (ref 3.5–5.1)
Sodium: 139 meq/L (ref 135–145)

## 2023-09-01 LAB — CBC
HCT: 42.2 % (ref 36.0–46.0)
Hemoglobin: 14.2 g/dL (ref 12.0–15.0)
MCHC: 33.6 g/dL (ref 30.0–36.0)
MCV: 107.1 fL — ABNORMAL HIGH (ref 78.0–100.0)
Platelets: 225 10*3/uL (ref 150.0–400.0)
RBC: 3.94 Mil/uL (ref 3.87–5.11)
RDW: 13.5 % (ref 11.5–15.5)
WBC: 4.9 10*3/uL (ref 4.0–10.5)

## 2023-09-01 LAB — TSH: TSH: 9.37 u[IU]/mL — ABNORMAL HIGH (ref 0.35–5.50)

## 2023-09-01 LAB — VITAMIN B12: Vitamin B-12: >1537 pg/mL — ABNORMAL HIGH (ref 211–911)

## 2023-09-01 MED ORDER — LEVOTHYROXINE SODIUM 125 MCG PO TABS: 125.0000 ug | ORAL_TABLET | Freq: Every day | ORAL | 3 refills | Status: DC

## 2023-09-01 NOTE — Progress Notes (Signed)
Established Patient Office Visit  Subjective:   Patient ID: Renee [name], female    DOB: Feb 16, 1971  Age: 53 y.o. MRN: [id number]  CC:  Chief Complaint  Patient presents with   Medical Management of Chronic Issues    HPI: Renee [name] is a 53 y.o. female presenting on 09/01/2023 for Medical Management of Chronic Issues  ADD: take ritalin occasionally while at work helps her to concentrate and focus. No cp palp and or sob. Needing updated opioid contract and UDS  Hypothyroid: on levothyroxine 100 mcg once daily , tsh has not been ordered since 2023.   Wt Readings from Last 3 Encounters:  09/01/23 156 lb 3.2 oz (70.9 kg)  07/13/23 155 lb (70.3 kg)  02/09/23 154 lb (69.9 kg)        ROS: Negative unless specifically indicated above in HPI.   Relevant past medical history reviewed and updated as indicated.   Allergies and medications reviewed and updated.   Current Outpatient Medications:    amLODipine (NORVASC) 5 MG tablet, TAKE 1 TABLET (5 MG TOTAL) BY MOUTH DAILY., Disp: 90 tablet, Rfl: 3   buPROPion (WELLBUTRIN XL) 300 MG 24 hr tablet, Take 1 tablet (300 mg total) by mouth daily., Disp: 90 tablet, Rfl: 3   clobetasol (TEMOVATE) 0.05 % external solution, APPLY TO AFFECTED AREA TWICE A DAY, Disp: 50 mL, Rfl: 0   ibuprofen (ADVIL) 800 MG tablet, Take 1 tablet (800 mg total) by mouth 3 (three) times daily., Disp: 90 tablet, Rfl: 1   levothyroxine (SYNTHROID) 100 MCG tablet, TAKE 1 TAB EVERY MORNING ON AN EMPTY STOMACH WITH WATER ONLY. NO FOOD OR OTHER MEDICATIONS X30 MIN, Disp: 90 tablet, Rfl: 0   methylphenidate (RITALIN) 20 MG tablet, Take 1 tablet (20 mg total) by mouth daily., Disp: 30 tablet, Rfl: 0   omeprazole (PRILOSEC) 20 MG capsule, TAKE 1 CAPSULE BY MOUTH EVERY DAY, Disp: 90 capsule, Rfl: 1   valsartan (DIOVAN) 80 MG tablet, Take 1 tablet (80 mg total) by mouth daily., Disp: 90 tablet, Rfl: 3  Current Facility-Administered Medications:    albuterol (PROVENTIL)  (2.5 MG/3ML) 0.083% nebulizer solution 2.5 mg, 2.5 mg, Nebulization, Once, Arieonna Medine, FNP  Allergies  Allergen Reactions   Amlodipine Other (See Comments)    Bil pedal edema at 10 mg dose    Prednisone Palpitations    Objective:   BP 134/86 (BP Location: Right Arm, Patient Position: Sitting, Cuff Size: Normal)   Pulse 72   Temp 98.3 F (36.8 C) (Temporal)   Ht 5' (1.524 m)   Wt 156 lb 3.2 oz (70.9 kg)   BMI 30.51 kg/m    Physical Exam Constitutional:      General: She is not in acute distress.    Appearance: Normal appearance. She is normal weight. She is not ill-appearing, toxic-appearing or diaphoretic.  HENT:     Head: Normocephalic.  Cardiovascular:     Rate and Rhythm: Normal rate and regular rhythm.  Pulmonary:     Effort: Pulmonary effort is normal.  Musculoskeletal:        General: Normal range of motion.  Neurological:     General: No focal deficit present.     Mental Status: She is alert and oriented to person, place, and time. Mental status is at baseline.  Psychiatric:        Mood and Affect: Mood normal.        Behavior: Behavior normal.        Thought  Content: Thought content normal.        Judgment: Judgment normal.     Assessment & Plan:  Mixed hyperlipidemia -     Lipid panel  Postablative hypothyroidism -     TSH  Primary hypertension -     Basic metabolic panel  Vitamin D deficiency -     VITAMIN D 25 Hydroxy (Vit-D Deficiency, Fractures)  Low serum vitamin B12 -     CBC -     Vitamin B12  High risk medication use -     DRUG MONITORING, PANEL 8 WITH CONFIRMATION, URINE     Follow up plan: No follow-ups on file.  Mort Sawyers, FNP

## 2023-09-02 DIAGNOSIS — E538 Deficiency of other specified B group vitamins: Secondary | ICD-10-CM | POA: Insufficient documentation

## 2023-09-02 DIAGNOSIS — Z853 Personal history of malignant neoplasm of breast: Secondary | ICD-10-CM | POA: Insufficient documentation

## 2023-09-02 DIAGNOSIS — Z8669 Personal history of other diseases of the nervous system and sense organs: Secondary | ICD-10-CM | POA: Insufficient documentation

## 2023-09-02 MED ORDER — METHYLPHENIDATE HCL 20 MG PO TABS: 20.0000 mg | ORAL_TABLET | Freq: Every day | ORAL | 0 refills | Status: DC

## 2023-09-02 NOTE — Assessment & Plan Note (Signed)
Pdmp reviewed UDS and drug contract updated.  Refill ritalin 20 mg

## 2023-09-02 NOTE — Assessment & Plan Note (Signed)
Symptomatic  Ordering tsh pending results  Continue levothyroxine 100 mcg once daily

## 2023-09-02 NOTE — Assessment & Plan Note (Signed)
Ordered lipid panel, pending results. Work on low cholesterol diet and exercise as tolerated

## 2023-09-02 NOTE — Assessment & Plan Note (Signed)
No episodes in over a decade Did d/w pt on wellbutrin, that we should be cautious as can decrease seizure threshold pt has been on this for years, prior to establishing with me and she is aware of risks and ok with continuing regimen as it has been working for her with anxiety

## 2023-09-03 LAB — DM TEMPLATE

## 2023-09-03 LAB — DRUG MONITORING, PANEL 8 WITH CONFIRMATION, URINE
6 Acetylmorphine: NEGATIVE ng/mL (ref <10)
Alcohol Metabolites: POSITIVE ng/mL — AB (ref <500)
Amphetamines: NEGATIVE ng/mL (ref <500)
Benzodiazepines: NEGATIVE ng/mL (ref <100)
Buprenorphine, Urine: NEGATIVE ng/mL (ref <5)
Cocaine Metabolite: NEGATIVE ng/mL (ref <150)
Creatinine: 134.7 mg/dL (ref >=20.0)
Ethyl Glucuronide (ETG): >100000 ng/mL — ABNORMAL HIGH (ref <500)
Ethyl Sulfate (ETS): 51952 ng/mL — ABNORMAL HIGH (ref <100)
MDMA: NEGATIVE ng/mL (ref <500)
Marijuana Metabolite: NEGATIVE ng/mL (ref <20)
Opiates: NEGATIVE ng/mL (ref <100)
Oxidant: NEGATIVE ug/mL (ref <200)
Oxycodone: NEGATIVE ng/mL (ref <100)
pH: 6.2 (ref 4.5–9.0)

## 2023-09-21 ENCOUNTER — Encounter (HOSPITAL_COMMUNITY): Payer: Self-pay | Admitting: Hematology

## 2023-09-21 ENCOUNTER — Encounter: Payer: Self-pay | Admitting: Family

## 2023-09-21 ENCOUNTER — Ambulatory Visit: Payer: BC Managed Care – PPO | Admitting: Family

## 2023-09-21 VITALS — BP 180/99 | HR 96 | Temp 98.0°F | Ht 60.0 in | Wt 154.2 lb

## 2023-09-21 DIAGNOSIS — I10 Essential (primary) hypertension: Secondary | ICD-10-CM

## 2023-09-21 DIAGNOSIS — F324 Major depressive disorder, single episode, in partial remission: Secondary | ICD-10-CM

## 2023-09-21 DIAGNOSIS — F411 Generalized anxiety disorder: Secondary | ICD-10-CM | POA: Insufficient documentation

## 2023-09-21 DIAGNOSIS — E890 Postprocedural hypothyroidism: Secondary | ICD-10-CM

## 2023-09-21 LAB — TSH: TSH: 0.28 u[IU]/mL — ABNORMAL LOW (ref 0.35–5.50)

## 2023-09-21 MED ORDER — SERTRALINE HCL 50 MG PO TABS: 50.0000 mg | ORAL_TABLET | Freq: Every day | ORAL | 2 refills | Status: DC

## 2023-09-21 MED ORDER — VALSARTAN 160 MG PO TABS: 160.0000 mg | ORAL_TABLET | Freq: Every day | ORAL | 3 refills | Status: DC

## 2023-09-21 MED ORDER — HYDROXYZINE PAMOATE 25 MG PO CAPS: 25.0000 mg | ORAL_CAPSULE | Freq: Three times a day (TID) | ORAL | 0 refills | Status: AC | PRN

## 2023-09-21 NOTE — Patient Instructions (Addendum)
  Increase valsartan to 160 mg once daily.    ------------------------------------ Hydroxyxine as needed for anxiety attacks.   Start sertraline 50 mg for anxiety and depression. Take 1/2 tablet by mouth once daily for about one week, then increase to 1 full tablet thereafter.   Taking the medicine as directed and not missing any doses is one of the best things you can do to treat your anxiety/depression.  Here are some things to keep in mind:  Side effects (stomach upset, some increased anxiety) may happen before you notice a benefit.  These side effects typically go away over time. Changes to your dose of medicine or a change in medication all together is sometimes necessary Many people will notice an improvement within two weeks but the full effect of the medication can take up to 4-6 weeks Stopping the medication when you start feeling better often results in a return of symptoms. Most people need to be on medication at least 6-12 months If you start having thoughts of hurting yourself or others after starting this medicine, please call me immediately.    ------------------------------------

## 2023-09-21 NOTE — Assessment & Plan Note (Signed)
Repeat tsh today  Pending results  R/o hyperactive, over suppressed with increased agitation and restlessness  Continue levothyroxine 125 mcg once daily

## 2023-09-21 NOTE — Assessment & Plan Note (Signed)
Increase valsartan to 160 mg once daily Continue amlodipine 5 mg (can not increase as pedal edema with 10 mg dose) Strict monitoring of blood pressure Monitor for s/s stroke

## 2023-09-21 NOTE — Assessment & Plan Note (Signed)
Hydroxyxine prn  Start sertraline 50 mg once daily  Continue wellbutrin 300 mg xl once daily  Referral placed for psychology

## 2023-09-21 NOTE — Progress Notes (Signed)
Established Patient Office Visit  Subjective:   Patient ID: Renee [name], female    DOB: 29-Apr-1971  Age: 53 y.o. MRN: [id number]  CC:  Chief Complaint  Patient presents with   Medical Management of Chronic Issues    HPI: Renee [name] is a 53 y.o. female presenting on 09/21/2023 for Medical Management of Chronic Issues  HTN: has been checking blood pressure and finding it as high as 178/110,  198/110. In the am will be more likely in the normal range but the rest of the day increases. No cp palp and or sob. She is on valsartan 80 mg once daily. Denies blurry vision and or headache. Does notice when she comes home from work the blood pressure comes down.   Anxiety: increasing stress at work and very anxious. Very restless. Anxious over her blood pressure. On wellbutrin 300 mg. Work has been a poor environment and is increasing her stress as well.   Hypothyroid: taking levothyroxine 125 mcg once daily taking separated from medication. Recent increase to 125 from tsh being elevated last lab draw.   ADD: has not been taking ritalin because she is worried about her blood pressure so has held off for right now.   EKG 09/15/22 NSR      ROS: Negative unless specifically indicated above in HPI.   Relevant past medical history reviewed and updated as indicated.   Allergies and medications reviewed and updated.   Current Outpatient Medications:    buPROPion (WELLBUTRIN XL) 300 MG 24 hr tablet, Take 1 tablet (300 mg total) by mouth daily., Disp: 90 tablet, Rfl: 3   clobetasol (TEMOVATE) 0.05 % external solution, APPLY TO AFFECTED AREA TWICE A DAY, Disp: 50 mL, Rfl: 0   hydrOXYzine (VISTARIL) 25 MG capsule, Take 1 capsule (25 mg total) by mouth every 8 (eight) hours as needed for anxiety., Disp: 30 capsule, Rfl: 0   ibuprofen (ADVIL) 800 MG tablet, Take 1 tablet (800 mg total) by mouth 3 (three) times daily., Disp: 90 tablet, Rfl: 1   levothyroxine (SYNTHROID) 125 MCG tablet, Take 1  tablet (125 mcg total) by mouth daily., Disp: 90 tablet, Rfl: 3   omeprazole (PRILOSEC) 20 MG capsule, TAKE 1 CAPSULE BY MOUTH EVERY DAY, Disp: 90 capsule, Rfl: 1   sertraline (ZOLOFT) 50 MG tablet, Take 1 tablet (50 mg total) by mouth daily., Disp: 30 tablet, Rfl: 2   valsartan (DIOVAN) 160 MG tablet, Take 1 tablet (160 mg total) by mouth daily., Disp: 90 tablet, Rfl: 3   methylphenidate (RITALIN) 20 MG tablet, Take 1 tablet (20 mg total) by mouth daily. (Patient not taking: Reported on 09/21/2023), Disp: 30 tablet, Rfl: 0  Current Facility-Administered Medications:    albuterol (PROVENTIL) (2.5 MG/3ML) 0.083% nebulizer solution 2.5 mg, 2.5 mg, Nebulization, Once, Julaine Zimny, FNP  Allergies  Allergen Reactions   Amlodipine Other (See Comments)    Bil pedal edema at 10 mg dose    Prednisone Palpitations    Objective:   BP (!) 180/99   Pulse 96   Temp 98 F (36.7 C) (Temporal)   Ht 5' (1.524 m)   Wt 154 lb 3.2 oz (69.9 kg)   SpO2 98%   BMI 30.12 kg/m    Physical Exam Vitals reviewed.  Constitutional:      General: She is not in acute distress.    Appearance: Normal appearance. She is normal weight. She is not ill-appearing, toxic-appearing or diaphoretic.  HENT:     Head: Normocephalic.  Cardiovascular:     Rate and Rhythm: Normal rate and regular rhythm.  Pulmonary:     Effort: Pulmonary effort is normal.  Musculoskeletal:        General: Normal range of motion.  Neurological:     General: No focal deficit present.     Mental Status: She is alert and oriented to person, place, and time. Mental status is at baseline.  Psychiatric:        Attention and Perception: Attention and perception normal.        Mood and Affect: Mood is anxious.        Speech: Speech normal.        Behavior: Behavior normal.        Thought Content: Thought content normal. Thought content does not include homicidal or suicidal ideation. Thought content does not include homicidal or suicidal  plan.        Cognition and Memory: Cognition and memory normal.        Judgment: Judgment normal.     Assessment & Plan:  Primary hypertension Assessment & Plan: Increase valsartan to 160 mg once daily Continue amlodipine 5 mg (can not increase as pedal edema with 10 mg dose) Strict monitoring of blood pressure Monitor for s/s stroke   Orders: -     Valsartan; Take 1 tablet (160 mg total) by mouth daily.  Dispense: 90 tablet; Refill: 3 -     Microalbumin / creatinine urine ratio  Postablative hypothyroidism Assessment & Plan: Repeat tsh today  Pending results  R/o hyperactive, over suppressed with increased agitation and restlessness  Continue levothyroxine 125 mcg once daily   Orders: -     TSH  Depression, major, single episode, in partial remission (HCC) -     hydrOXYzine Pamoate; Take 1 capsule (25 mg total) by mouth every 8 (eight) hours as needed for anxiety.  Dispense: 30 capsule; Refill: 0 -     Sertraline HCl; Take 1 tablet (50 mg total) by mouth daily.  Dispense: 30 tablet; Refill: 2 -     Ambulatory referral to Psychology  Anxiety state Assessment & Plan: Hydroxyxine prn  Start sertraline 50 mg once daily  Continue wellbutrin 300 mg xl once daily  Referral placed for psychology    Orders: -     hydrOXYzine Pamoate; Take 1 capsule (25 mg total) by mouth every 8 (eight) hours as needed for anxiety.  Dispense: 30 capsule; Refill: 0 -     Sertraline HCl; Take 1 tablet (50 mg total) by mouth daily.  Dispense: 30 tablet; Refill: 2 -     Ambulatory referral to Psychology     Follow up plan: Return in about 2 weeks (around 10/05/2023) for f/u anxiety.  Mort Sawyers, FNP

## 2023-09-22 ENCOUNTER — Other Ambulatory Visit: Payer: Self-pay | Admitting: Family

## 2023-09-22 ENCOUNTER — Encounter: Payer: Self-pay | Admitting: Family

## 2023-09-22 DIAGNOSIS — E890 Postprocedural hypothyroidism: Secondary | ICD-10-CM

## 2023-09-22 LAB — MICROALBUMIN / CREATININE URINE RATIO
Creatinine,U: 31.9 mg/dL
Microalb Creat Ratio: 22 mg/g (ref 0.0–30.0)
Microalb, Ur: <0.7 mg/dL (ref 0.0–1.9)

## 2023-10-04 ENCOUNTER — Telehealth: Payer: Self-pay | Admitting: Family

## 2023-10-04 ENCOUNTER — Telehealth: Payer: BC Managed Care – PPO | Admitting: Physician Assistant

## 2023-10-04 DIAGNOSIS — A084 Viral intestinal infection, unspecified: Secondary | ICD-10-CM

## 2023-10-04 MED ORDER — ONDANSETRON 4 MG PO TBDP: 4.0000 mg | ORAL_TABLET | Freq: Three times a day (TID) | ORAL | 0 refills | Status: DC | PRN

## 2023-10-04 NOTE — Progress Notes (Signed)

## 2023-10-04 NOTE — Telephone Encounter (Signed)
Agree with precautions given to pt  Agree with nurse assessment in plan.  Thank you for speaking with them.

## 2023-10-04 NOTE — Telephone Encounter (Signed)
 Copied from CRM [id number]. Topic: Appointments - Appointment Scheduling >> Oct 04, 2023 10:06 AM Tiffany H wrote: Patient called to advise that she has been throwing up and having diarrhea. Patient lost control of her bowels while at work and wanted to come home immediately. No Acute visits available. Patient will go to UC.   Please follow up with patient.     This RN called and spoke with patient.  Patient is made aware that she has an additional message in MyChart from e-visit for Nausea/Vomiting.  This RN went over the message from Danielys Madry [facility] and advised patient about Zofran sent to her pharmacy.  Patient advised of Care Advice as well within this message.  She advised that she was going to try taking the Zofran and following the Care Advice.  She is very appreciative of getting something for nausea and the advice from her PCP office.  She is advised that if anything worsens to either call us back or go to the emergency room.  Patient verbalized understanding.

## 2023-10-13 ENCOUNTER — Other Ambulatory Visit: Payer: Self-pay | Admitting: Family

## 2023-10-13 DIAGNOSIS — F900 Attention-deficit hyperactivity disorder, predominantly inattentive type: Secondary | ICD-10-CM

## 2023-10-14 ENCOUNTER — Other Ambulatory Visit: Payer: Self-pay | Admitting: Family

## 2023-10-14 DIAGNOSIS — F324 Major depressive disorder, single episode, in partial remission: Secondary | ICD-10-CM

## 2023-10-14 DIAGNOSIS — F411 Generalized anxiety disorder: Secondary | ICD-10-CM

## 2023-10-19 MED ORDER — METHYLPHENIDATE HCL 20 MG PO TABS
20.0000 mg | ORAL_TABLET | Freq: Every day | ORAL | 0 refills | Status: DC
Start: 2023-10-19 — End: 2024-02-13

## 2023-12-11 ENCOUNTER — Other Ambulatory Visit: Payer: Self-pay | Admitting: Family

## 2023-12-11 DIAGNOSIS — L259 Unspecified contact dermatitis, unspecified cause: Secondary | ICD-10-CM

## 2023-12-11 DIAGNOSIS — I10 Essential (primary) hypertension: Secondary | ICD-10-CM

## 2023-12-25 ENCOUNTER — Other Ambulatory Visit: Payer: Self-pay | Admitting: Family

## 2023-12-25 DIAGNOSIS — I10 Essential (primary) hypertension: Secondary | ICD-10-CM

## 2023-12-25 DIAGNOSIS — L259 Unspecified contact dermatitis, unspecified cause: Secondary | ICD-10-CM

## 2023-12-29 ENCOUNTER — Other Ambulatory Visit: Payer: Self-pay | Admitting: Family

## 2023-12-29 DIAGNOSIS — I10 Essential (primary) hypertension: Secondary | ICD-10-CM

## 2024-01-12 ENCOUNTER — Encounter: Payer: Self-pay | Admitting: Family

## 2024-01-12 DIAGNOSIS — I10 Essential (primary) hypertension: Secondary | ICD-10-CM

## 2024-01-16 MED ORDER — AMLODIPINE BESYLATE 5 MG PO TABS: 5.0000 mg | ORAL_TABLET | Freq: Every day | ORAL | 0 refills | Status: DC

## 2024-01-19 ENCOUNTER — Ambulatory Visit: Admitting: Family

## 2024-02-02 ENCOUNTER — Ambulatory Visit: Admitting: Family

## 2024-02-02 ENCOUNTER — Ambulatory Visit: Payer: Self-pay | Admitting: Family

## 2024-02-02 ENCOUNTER — Encounter: Payer: Self-pay | Admitting: Family

## 2024-02-02 VITALS — BP 116/72 | HR 79 | Temp 98.2°F | Ht 60.0 in | Wt 164.1 lb

## 2024-02-02 DIAGNOSIS — R1032 Left lower quadrant pain: Secondary | ICD-10-CM | POA: Diagnosis not present

## 2024-02-02 DIAGNOSIS — K6289 Other specified diseases of anus and rectum: Secondary | ICD-10-CM

## 2024-02-02 DIAGNOSIS — E890 Postprocedural hypothyroidism: Secondary | ICD-10-CM | POA: Diagnosis not present

## 2024-02-02 DIAGNOSIS — F900 Attention-deficit hyperactivity disorder, predominantly inattentive type: Secondary | ICD-10-CM

## 2024-02-02 DIAGNOSIS — F411 Generalized anxiety disorder: Secondary | ICD-10-CM

## 2024-02-02 DIAGNOSIS — R41840 Attention and concentration deficit: Secondary | ICD-10-CM

## 2024-02-02 DIAGNOSIS — R197 Diarrhea, unspecified: Secondary | ICD-10-CM

## 2024-02-02 DIAGNOSIS — F324 Major depressive disorder, single episode, in partial remission: Secondary | ICD-10-CM

## 2024-02-02 LAB — COMPREHENSIVE METABOLIC PANEL WITH GFR
ALT: 15 U/L (ref 0–35)
AST: 20 U/L (ref 0–37)
Albumin: 4.1 g/dL (ref 3.5–5.2)
Alkaline Phosphatase: 89 U/L (ref 39–117)
BUN: 16 mg/dL (ref 6–23)
CO2: 28 meq/L (ref 19–32)
Calcium: 9 mg/dL (ref 8.4–10.5)
Chloride: 101 meq/L (ref 96–112)
Creatinine, Ser: 0.91 mg/dL (ref 0.40–1.20)
GFR: 71.61 mL/min (ref >60.00)
Glucose, Bld: 80 mg/dL (ref 70–99)
Potassium: 4.6 meq/L (ref 3.5–5.1)
Sodium: 139 meq/L (ref 135–145)
Total Bilirubin: 0.4 mg/dL (ref 0.2–1.2)
Total Protein: 6.7 g/dL (ref 6.0–8.3)

## 2024-02-02 LAB — TSH: TSH: 2.3 u[IU]/mL (ref 0.35–5.50)

## 2024-02-02 LAB — CBC WITH DIFFERENTIAL/PLATELET
Basophils Absolute: 0 10*3/uL (ref 0.0–0.1)
Basophils Relative: 1.1 % (ref 0.0–3.0)
Eosinophils Absolute: 0.1 10*3/uL (ref 0.0–0.7)
Eosinophils Relative: 1.6 % (ref 0.0–5.0)
HCT: 41.1 % (ref 36.0–46.0)
Hemoglobin: 13.7 g/dL (ref 12.0–15.0)
Lymphocytes Relative: 25.6 % (ref 12.0–46.0)
Lymphs Abs: 1.2 10*3/uL (ref 0.7–4.0)
MCHC: 33.3 g/dL (ref 30.0–36.0)
MCV: 101.1 fl — ABNORMAL HIGH (ref 78.0–100.0)
Monocytes Absolute: 0.5 10*3/uL (ref 0.1–1.0)
Monocytes Relative: 10.9 % (ref 3.0–12.0)
Neutro Abs: 2.7 10*3/uL (ref 1.4–7.7)
Neutrophils Relative %: 60.8 % (ref 43.0–77.0)
Platelets: 251 10*3/uL (ref 150.0–400.0)
RBC: 4.07 Mil/uL (ref 3.87–5.11)
RDW: 12.7 % (ref 11.5–15.5)
WBC: 4.5 10*3/uL (ref 4.0–10.5)

## 2024-02-02 MED ORDER — AZITHROMYCIN 250 MG PO TABS: ORAL_TABLET | ORAL | 0 refills | Status: AC

## 2024-02-02 MED ORDER — SYNTHROID 125 MCG PO TABS: ORAL_TABLET | ORAL | Status: DC

## 2024-02-02 MED ORDER — LISDEXAMFETAMINE DIMESYLATE 20 MG PO CAPS: 20.0000 mg | ORAL_CAPSULE | Freq: Every day | ORAL | 0 refills | Status: DC

## 2024-02-02 MED ORDER — HYDROCORTISONE 2.5 % EX CREA
TOPICAL_CREAM | Freq: Two times a day (BID) | CUTANEOUS | 0 refills | Status: DC
Start: 2024-02-02 — End: 2024-02-13

## 2024-02-02 NOTE — Progress Notes (Signed)
 Established Patient Office Visit  Subjective:   Patient ID: Renee [name], female    DOB: 1970-11-24  Age: 53 y.o. MRN: [id number]  CC:  Chief Complaint  Patient presents with   Diarrhea    C/o diarrhea for 17 days. Denies nausea/vomiting. Tried OTC Imodium.    HPI: Renee [name] is a 53 y.o. female presenting on 02/02/2024 for Diarrhea (C/o diarrhea for 17 days. Denies nausea/vomiting. Tried OTC Imodium.)  Diarrhea ongoing for 19 days. Has been taking immodium but only temporary relief. She did go to the beach 6/6 and this is when it started, second day there. Has dwindled down to about two times but within the week prior was going multiple times a day. There is some mucous in the stool no blood seen. She is not reporting any abdominal pain. No n/v  Lab Results  Component Value Date   TSH 0.28 (L) 09/21/2023   Wt Readings from Last 3 Encounters:  02/02/24 164 lb 2 oz (74.4 kg)  09/21/23 154 lb 3.2 oz (69.9 kg)  09/01/23 156 lb 3.2 oz (70.9 kg)   Weight gain, has gained ten pounds since February. She did have abn tsh 09/2023 and was due for repeat tsh but she is overdue. She is on levothyroxine  125 mcg once daily x six days off the 7th. Separate from food and or other medications.   ADD: was taking ritalin  but it made her blood pressure rise. She has had adderall in the past was not helping.       ROS: Negative unless specifically indicated above in HPI.   Relevant past medical history reviewed and updated as indicated.   Allergies and medications reviewed and updated.   Current Outpatient Medications:    amLODipine  (NORVASC ) 5 MG tablet, Take 1 tablet (5 mg total) by mouth daily., Disp: 90 tablet, Rfl: 0   azithromycin  (ZITHROMAX ) 250 MG tablet, Take 2 tablets on day 1, then 1 tablet daily on days 2 through 5, Disp: 6 tablet, Rfl: 0   buPROPion  (WELLBUTRIN  XL) 300 MG 24 hr tablet, Take 1 tablet (300 mg total) by mouth daily., Disp: 90 tablet, Rfl: 3   hydrocortisone  2.5 % cream, Apply topically 2 (two) times daily., Disp: 30 g, Rfl: 0   hydrOXYzine  (VISTARIL ) 25 MG capsule, Take 1 capsule (25 mg total) by mouth every 8 (eight) hours as needed for anxiety., Disp: 30 capsule, Rfl: 0   ibuprofen  (ADVIL ) 800 MG tablet, Take 1 tablet (800 mg total) by mouth 3 (three) times daily., Disp: 90 tablet, Rfl: 1   lisdexamfetamine (VYVANSE) 20 MG capsule, Take 1 capsule (20 mg total) by mouth daily., Disp: 30 capsule, Rfl: 0   methylphenidate  (RITALIN ) 20 MG tablet, Take 1 tablet (20 mg total) by mouth daily., Disp: 30 tablet, Rfl: 0   omeprazole  (PRILOSEC) 20 MG capsule, TAKE 1 CAPSULE BY MOUTH EVERY DAY, Disp: 90 capsule, Rfl: 1   SYNTHROID  125 MCG tablet, Take one po every day for six days off the seventh day, Disp: , Rfl:    valsartan  (DIOVAN ) 160 MG tablet, Take 1 tablet (160 mg total) by mouth daily., Disp: 90 tablet, Rfl: 3  Current Facility-Administered Medications:    albuterol  (PROVENTIL ) (2.5 MG/3ML) 0.083% nebulizer solution 2.5 mg, 2.5 mg, Nebulization, Once, Yuya Vanwingerden, FNP  Allergies  Allergen Reactions   Amlodipine  Other (See Comments)    Bil pedal edema at 10 mg dose    Prednisone  Palpitations    Objective:  BP 116/72   Pulse 79   Temp 98.2 F (36.8 C) (Oral)   Ht 5' (1.524 m)   Wt 164 lb 2 oz (74.4 kg)   SpO2 94%   BMI 32.05 kg/m    Physical Exam Constitutional:      General: She is not in acute distress.    Appearance: Normal appearance. She is normal weight. She is not ill-appearing, toxic-appearing or diaphoretic.  HENT:     Head: Normocephalic.   Cardiovascular:     Rate and Rhythm: Normal rate and regular rhythm.  Pulmonary:     Effort: Pulmonary effort is normal.  Abdominal:     General: Bowel sounds are increased.     Palpations: Abdomen is soft.     Tenderness: There is abdominal tenderness in the left lower quadrant. There is rebound. There is no guarding.     Hernia: No hernia is present.   Musculoskeletal:         General: Normal range of motion.   Neurological:     General: No focal deficit present.     Mental Status: She is alert and oriented to person, place, and time. Mental status is at baseline.   Psychiatric:        Mood and Affect: Mood normal.        Behavior: Behavior normal.        Thought Content: Thought content normal.        Judgment: Judgment normal.   Left lower abdominal quadrant 8/10   Assessment & Plan:  Diarrhea of presumed infectious origin Assessment & Plan: Rx zpack for possible ecoli, campylobacter  Stool panel today pending results.  Proceed with bland diet.  Orders: -     Azithromycin ; Take 2 tablets on day 1, then 1 tablet daily on days 2 through 5  Dispense: 6 tablet; Refill: 0 -     C. difficile GDH and Toxin A/B; Future -     Gastrointestinal Pathogen Pnl RT, PCR; Future -     Giardia antigen; Future  Postablative hypothyroidism Assessment & Plan: Symptomatic with weight gain.  Repeat tsh today   Orders: -     TSH -     Synthroid ; Take one po every day for six days off the seventh day  Attention or concentration deficit Assessment & Plan: Trial vyvanse Failed adderall and ritalin .  Non opioid contract renewed. UDS utd  Orders: -     Lisdexamfetamine Dimesylate; Take 1 capsule (20 mg total) by mouth daily.  Dispense: 30 capsule; Refill: 0  Attention deficit hyperactivity disorder (ADHD), predominantly inattentive type Assessment & Plan: Trial vyvanse Failed adderall and ritalin .  Non opioid contract renewed. UDS utd   Left lower quadrant abdominal pain Assessment & Plan: Cbc cmp and u/s ordered pending results.  Suspect gaseous however will consider CT abd pelvis if no improvement   Orders: -     CBC with Differential/Platelet -     Comprehensive metabolic panel with GFR -     Urinalysis w microscopic + reflex cultur  Rectal irritation -     Hydrocortisone; Apply topically 2 (two) times daily.  Dispense: 30 g; Refill: 0      Follow up plan: Return in about 3 months (around 05/04/2024) for f/u ADD medication.  Renee Patrick, FNP

## 2024-02-02 NOTE — Assessment & Plan Note (Signed)
 Trial vyvanse Failed adderall and ritalin .  Non opioid contract renewed. UDS utd

## 2024-02-02 NOTE — Assessment & Plan Note (Signed)
 Symptomatic with weight gain.  Repeat tsh today

## 2024-02-02 NOTE — Assessment & Plan Note (Signed)
 Rx zpack for possible ecoli, campylobacter  Stool panel today pending results.  Proceed with bland diet.

## 2024-02-02 NOTE — Assessment & Plan Note (Signed)
 Cbc cmp and u/s ordered pending results.  Suspect gaseous however will consider CT abd pelvis if no improvement

## 2024-02-04 LAB — URINE CULTURE
MICRO NUMBER:: 16632516
SPECIMEN QUALITY:: ADEQUATE

## 2024-02-04 LAB — URINALYSIS W MICROSCOPIC + REFLEX CULTURE
Bacteria, UA: NONE SEEN /HPF
Bilirubin Urine: NEGATIVE
Glucose, UA: NEGATIVE
Hgb urine dipstick: NEGATIVE
Hyaline Cast: NONE SEEN /LPF
Nitrites, Initial: NEGATIVE
Protein, ur: NEGATIVE
Specific Gravity, Urine: 1.025 (ref 1.001–1.035)
Squamous Epithelial / HPF: NONE SEEN /HPF (ref <=5)
WBC, UA: >=60 /HPF — AB (ref 0–5)
pH: 5.5 (ref 5.0–8.0)

## 2024-02-04 LAB — CULTURE INDICATED

## 2024-02-13 ENCOUNTER — Telehealth: Payer: Self-pay

## 2024-02-13 ENCOUNTER — Encounter (HOSPITAL_COMMUNITY): Payer: Self-pay | Admitting: Hematology

## 2024-02-13 ENCOUNTER — Other Ambulatory Visit (HOSPITAL_COMMUNITY): Payer: Self-pay

## 2024-02-13 ENCOUNTER — Other Ambulatory Visit: Payer: Self-pay

## 2024-02-13 DIAGNOSIS — R197 Diarrhea, unspecified: Secondary | ICD-10-CM

## 2024-02-13 MED ORDER — SYNTHROID 125 MCG PO TABS
ORAL_TABLET | ORAL | 2 refills | Status: AC
Start: 2024-02-13 — End: ?

## 2024-02-13 MED ORDER — HYDROCORTISONE 2.5 % EX CREA: TOPICAL_CREAM | Freq: Two times a day (BID) | CUTANEOUS | 0 refills | Status: DC

## 2024-02-13 NOTE — Telephone Encounter (Signed)
 Pharmacy Patient Advocate Encounter   Received notification from Patient Advice Request messages that prior authorization for LISDEXAMFETAMINE 20 MG CAPSULE is required/requested.   Insurance verification completed.   The patient is insured through [facility] .   Per test claim: PA required; PA submitted to above mentioned insurance via Prompt PA Key/confirmation #/[id number] Status is pending

## 2024-02-13 NOTE — Telephone Encounter (Signed)
 Can we start a prior auth for vyvanse ? Pt has tried and failed ritalin  and adderall

## 2024-02-14 LAB — C. DIFFICILE GDH AND TOXIN A/B
GDH ANTIGEN: NOT DETECTED
MICRO NUMBER:: 16665412
SPECIMEN QUALITY:: ADEQUATE
TOXIN A AND B: NOT DETECTED

## 2024-02-15 ENCOUNTER — Other Ambulatory Visit (HOSPITAL_COMMUNITY): Payer: Self-pay

## 2024-02-15 ENCOUNTER — Other Ambulatory Visit: Payer: Self-pay | Admitting: Family

## 2024-02-15 LAB — GASTROINTESTINAL PATHOGEN PNL
CampyloBacter Group: NOT DETECTED
Norovirus GI/GII: NOT DETECTED
Rotavirus A: NOT DETECTED
Salmonella species: NOT DETECTED
Shiga Toxin 1: NOT DETECTED
Shiga Toxin 2: NOT DETECTED
Shigella Species: NOT DETECTED
Vibrio Group: NOT DETECTED
Yersinia enterocolitica: NOT DETECTED

## 2024-02-15 LAB — GIARDIA ANTIGEN
MICRO NUMBER:: 16666894
RESULT:: NOT DETECTED
SPECIMEN QUALITY:: ADEQUATE

## 2024-02-15 MED ORDER — METRONIDAZOLE 500 MG PO TABS: 500.0000 mg | ORAL_TABLET | Freq: Two times a day (BID) | ORAL | 0 refills | Status: AC

## 2024-02-15 NOTE — Addendum Note (Signed)
 Addended by: CORWIN ANTU on: 02/15/2024 01:14 PM   Modules accepted: Orders

## 2024-02-15 NOTE — Telephone Encounter (Signed)
Left VM letting pharmacy know PA was approved

## 2024-02-15 NOTE — Telephone Encounter (Signed)
 Pharmacy Patient Advocate Encounter  Received notification from [employer] that Prior Authorization for LISDEXAMFETAMINE 20 MG CAPSULE  has been APPROVED from 02/14/24 to 02/12/26. Ran test claim, Copay is $10. This test claim was processed through [facility]- copay amounts may vary at other pharmacies due to pharmacy/plan contracts, or as the patient moves through the different stages of their insurance plan.   PA #/Case ID/Reference #: [id number]

## 2024-02-19 ENCOUNTER — Other Ambulatory Visit: Payer: Self-pay | Admitting: Family

## 2024-02-19 DIAGNOSIS — I10 Essential (primary) hypertension: Secondary | ICD-10-CM

## 2024-02-24 ENCOUNTER — Other Ambulatory Visit (HOSPITAL_COMMUNITY): Payer: Self-pay

## 2024-03-20 ENCOUNTER — Other Ambulatory Visit: Payer: Self-pay | Admitting: Family

## 2024-03-20 DIAGNOSIS — F3341 Major depressive disorder, recurrent, in partial remission: Secondary | ICD-10-CM

## 2024-03-21 ENCOUNTER — Other Ambulatory Visit: Payer: Self-pay | Admitting: Family

## 2024-03-21 DIAGNOSIS — I10 Essential (primary) hypertension: Secondary | ICD-10-CM

## 2024-03-22 ENCOUNTER — Encounter: Payer: Self-pay | Admitting: Family

## 2024-03-22 DIAGNOSIS — R41840 Attention and concentration deficit: Secondary | ICD-10-CM

## 2024-03-22 MED ORDER — LISDEXAMFETAMINE DIMESYLATE 20 MG PO CAPS: 20.0000 mg | ORAL_CAPSULE | Freq: Every day | ORAL | 0 refills | Status: DC

## 2024-04-27 ENCOUNTER — Encounter: Payer: Self-pay | Admitting: Family

## 2024-04-27 DIAGNOSIS — R41840 Attention and concentration deficit: Secondary | ICD-10-CM

## 2024-04-30 MED ORDER — LISDEXAMFETAMINE DIMESYLATE 20 MG PO CAPS
20.0000 mg | ORAL_CAPSULE | Freq: Every day | ORAL | 0 refills | Status: DC
Start: 2024-04-30 — End: 2024-06-29

## 2024-05-15 ENCOUNTER — Other Ambulatory Visit: Payer: Self-pay | Admitting: Family

## 2024-05-15 DIAGNOSIS — E890 Postprocedural hypothyroidism: Secondary | ICD-10-CM

## 2024-05-27 ENCOUNTER — Other Ambulatory Visit: Payer: Self-pay | Admitting: Family

## 2024-05-27 DIAGNOSIS — F3341 Major depressive disorder, recurrent, in partial remission: Secondary | ICD-10-CM

## 2024-06-29 ENCOUNTER — Ambulatory Visit: Admitting: Family

## 2024-06-29 ENCOUNTER — Encounter: Payer: Self-pay | Admitting: Family

## 2024-06-29 VITALS — BP 124/74 | HR 78 | Temp 98.0°F | Wt 174.6 lb

## 2024-06-29 DIAGNOSIS — L03818 Cellulitis of other sites: Secondary | ICD-10-CM

## 2024-06-29 DIAGNOSIS — R21 Rash and other nonspecific skin eruption: Secondary | ICD-10-CM

## 2024-06-29 DIAGNOSIS — E782 Mixed hyperlipidemia: Secondary | ICD-10-CM

## 2024-06-29 DIAGNOSIS — E538 Deficiency of other specified B group vitamins: Secondary | ICD-10-CM | POA: Diagnosis not present

## 2024-06-29 DIAGNOSIS — I10 Essential (primary) hypertension: Secondary | ICD-10-CM

## 2024-06-29 DIAGNOSIS — L249 Irritant contact dermatitis, unspecified cause: Secondary | ICD-10-CM

## 2024-06-29 DIAGNOSIS — E890 Postprocedural hypothyroidism: Secondary | ICD-10-CM

## 2024-06-29 DIAGNOSIS — R41840 Attention and concentration deficit: Secondary | ICD-10-CM

## 2024-06-29 LAB — T4, FREE: Free T4: 1.13 ng/dL (ref 0.60–1.60)

## 2024-06-29 LAB — CBC WITH DIFFERENTIAL/PLATELET
Basophils Absolute: 0.1 K/uL (ref 0.0–0.1)
Basophils Relative: 1 % (ref 0.0–3.0)
Eosinophils Absolute: 0.1 K/uL (ref 0.0–0.7)
Eosinophils Relative: 1.6 % (ref 0.0–5.0)
HCT: 40.2 % (ref 36.0–46.0)
Hemoglobin: 13.6 g/dL (ref 12.0–15.0)
Lymphocytes Relative: 21.2 % (ref 12.0–46.0)
Lymphs Abs: 1.1 K/uL (ref 0.7–4.0)
MCHC: 33.9 g/dL (ref 30.0–36.0)
MCV: 102.5 fl — ABNORMAL HIGH (ref 78.0–100.0)
Monocytes Absolute: 0.6 K/uL (ref 0.1–1.0)
Monocytes Relative: 11.7 % (ref 3.0–12.0)
Neutro Abs: 3.4 K/uL (ref 1.4–7.7)
Neutrophils Relative %: 64.5 % (ref 43.0–77.0)
Platelets: 227 K/uL (ref 150.0–400.0)
RBC: 3.92 Mil/uL (ref 3.87–5.11)
RDW: 13.9 % (ref 11.5–15.5)
WBC: 5.3 K/uL (ref 4.0–10.5)

## 2024-06-29 LAB — SEDIMENTATION RATE: Sed Rate: 21 mm/h (ref 0–30)

## 2024-06-29 LAB — VITAMIN B12: Vitamin B-12: 257 pg/mL (ref 211–911)

## 2024-06-29 LAB — LIPID PANEL
Cholesterol: 248 mg/dL — ABNORMAL HIGH (ref 0–200)
HDL: 64.8 mg/dL (ref >39.00)
LDL Cholesterol: 152 mg/dL — ABNORMAL HIGH (ref 0–99)
NonHDL: 183.49
Total CHOL/HDL Ratio: 4
Triglycerides: 158 mg/dL — ABNORMAL HIGH (ref 0.0–149.0)
VLDL: 31.6 mg/dL (ref 0.0–40.0)

## 2024-06-29 LAB — C-REACTIVE PROTEIN: CRP: <0.5 mg/dL (ref 0.5–20.0)

## 2024-06-29 LAB — TSH: TSH: 1.59 u[IU]/mL (ref 0.35–5.50)

## 2024-06-29 MED ORDER — CLOBETASOL PROPIONATE 0.05 % EX CREA: 1.0000 | TOPICAL_CREAM | Freq: Two times a day (BID) | CUTANEOUS | 0 refills | Status: DC

## 2024-06-29 MED ORDER — LISDEXAMFETAMINE DIMESYLATE 30 MG PO CAPS: 30.0000 mg | ORAL_CAPSULE | Freq: Every day | ORAL | 0 refills | Status: AC

## 2024-06-29 MED ORDER — DOXYCYCLINE HYCLATE 100 MG PO TABS: 100.0000 mg | ORAL_TABLET | Freq: Two times a day (BID) | ORAL | 0 refills | Status: AC

## 2024-06-29 NOTE — Progress Notes (Signed)
 Established Patient Office Visit  Subjective:      CC:  Chief Complaint  Patient presents with   Medical Management of Chronic Issues    HPI: Renee [name] is a 53 y.o. female presenting on 06/29/2024 for Medical Management of Chronic Issues .  Discussed the use of AI scribe software for clinical note transcription with the patient, who gave verbal consent to proceed.  History of Present Illness Renee [name] is a 53 year old female who presents with a persistent rash unresponsive to antifungal treatments.  The rash began approximately one month ago, initially appearing on the chest and wrists, and has since spread to the thighs, feet, and neck. It is characterized by itchiness, blistering, and occasional scabbing due to scratching. The rash predominantly affects skin folds and areas prone to sweating, although she reports no recent excessive sweating.  She has attempted various treatments including antifungal creams, athlete's foot medication, and a powder recommended by a wound nurse, all without relief. A cornstarch bath provided temporary improvement on her thighs. Despite these efforts, there has been no significant improvement.  There have been no recent changes in medications, soaps, or detergents. She has a known allergy to prednisone , which causes palpitations, but has not started any new medications around the time the rash began. No new joint pains or other systemic symptoms are present.           Social history:  Relevant past medical, surgical, family and social history reviewed and updated as indicated. Interim medical history since our last visit reviewed.  Allergies and medications reviewed and updated.  DATA REVIEWED: CHART IN EPIC     ROS: Negative unless specifically indicated above in HPI.    Current Outpatient Medications:    amLODipine  (NORVASC ) 5 MG tablet, TAKE 1 TABLET (5 MG TOTAL) BY MOUTH DAILY., Disp: 90 tablet, Rfl: 0   buPROPion   (WELLBUTRIN  XL) 300 MG 24 hr tablet, TAKE 1 TABLET BY MOUTH EVERY DAY, Disp: 90 tablet, Rfl: 2   clobetasol  cream (TEMOVATE ) 0.05 %, Apply 1 Application topically 2 (two) times daily., Disp: 30 g, Rfl: 0   doxycycline  (VIBRA -TABS) 100 MG tablet, Take 1 tablet (100 mg total) by mouth 2 (two) times daily for 10 days., Disp: 20 tablet, Rfl: 0   hydrOXYzine  (VISTARIL ) 25 MG capsule, Take 1 capsule (25 mg total) by mouth every 8 (eight) hours as needed for anxiety., Disp: 30 capsule, Rfl: 0   ibuprofen  (ADVIL ) 800 MG tablet, Take 1 tablet (800 mg total) by mouth 3 (three) times daily., Disp: 90 tablet, Rfl: 1   lisdexamfetamine (VYVANSE ) 30 MG capsule, Take 1 capsule (30 mg total) by mouth daily., Disp: 30 capsule, Rfl: 0   [START ON 07/20/2024] lisdexamfetamine (VYVANSE ) 30 MG capsule, Take 1 capsule (30 mg total) by mouth daily., Disp: 30 capsule, Rfl: 0   [START ON 08/10/2024] lisdexamfetamine (VYVANSE ) 30 MG capsule, Take 1 capsule (30 mg total) by mouth daily., Disp: 30 capsule, Rfl: 0   omeprazole  (PRILOSEC) 20 MG capsule, TAKE 1 CAPSULE BY MOUTH EVERY DAY, Disp: 90 capsule, Rfl: 1   SYNTHROID  125 MCG tablet, Take one po every day for six days off the seventh day, Disp: 30 tablet, Rfl: 2   valsartan  (DIOVAN ) 80 MG tablet, TAKE 1 TABLET BY MOUTH EVERY DAY, Disp: 90 tablet, Rfl: 0  Current Facility-Administered Medications:    albuterol  (PROVENTIL ) (2.5 MG/3ML) 0.083% nebulizer solution 2.5 mg, 2.5 mg, Nebulization, Once, Corwin Antu, FNP  Objective:        BP 124/74 (BP Location: Right Arm, Patient Position: Sitting, Cuff Size: Normal)   Pulse 78   Temp 98 F (36.7 C) (Temporal)   Wt 174 lb 9.6 oz (79.2 kg)   SpO2 98%   BMI 34.10 kg/m   Physical Exam SKIN: Rash in skin folds, itchy, unresponsive to antifungals, suggestive of staphylococcal infection or contact dermatitis.  Wt Readings from Last 3 Encounters:  06/29/24 174 lb 9.6 oz (79.2 kg)  02/02/24 164 lb 2 oz (74.4  kg)  09/21/23 154 lb 3.2 oz (69.9 kg)    Physical Exam Constitutional:      General: She is not in acute distress.    Appearance: Normal appearance. She is normal weight. She is not ill-appearing, toxic-appearing or diaphoretic.  HENT:     Head: Normocephalic.  Cardiovascular:     Rate and Rhythm: Normal rate and regular rhythm.  Pulmonary:     Effort: Pulmonary effort is normal.  Musculoskeletal:        General: Normal range of motion.  Skin:    Comments: Raised papular lesions cluttered in various places on the body to include mid to lateral back, inferior breasts and between breasts, inner thigh bil, and anterior foot right side.   Very few with scaly texture. One cluster appears to be vesicular.   Neurological:     General: No focal deficit present.     Mental Status: She is alert and oriented to person, place, and time. Mental status is at baseline.  Psychiatric:        Mood and Affect: Mood normal.        Behavior: Behavior normal.        Thought Content: Thought content normal.        Judgment: Judgment normal.            Results   Assessment & Plan:   Assessment and Plan Assessment & Plan Progressive pruritic rash with secondary infection Rash present for approximately one month, initially suspected to be fungal but unresponsive to antifungal treatments. Rash is spreading and located in skin folds, including wrists and chest. Differential diagnosis includes yeast infection, contact dermatitis, staph infection, or allergic dermatitis. Rash is pruritic and has led to secondary infection due to scratching. No improvement with previous treatments including antifungal creams and powders. Consideration of autoimmune causes, though less likely. No family history of autoimmune conditions. No joint pain or other systemic symptoms reported. - Prescribed clobetasol  topical steroid for rash. - Prescribed doxycycline  for potential bacterial infection. - Referred to  dermatology for further evaluation if rash does not improve. - Ordered labs including CBC, thyroid  function tests, and sed rate to evaluate for underlying causes.  Postablative hypothyroidism Managed with Synthroid . - Continue Synthroid  125 mcg daily.  Essential hypertension Blood pressure is well-controlled on current regimen of amlodipine  and valsartan . - Continue amlodipine  5 mg daily. - Continue valsartan  80 mg daily.  Attention-deficit hyperactivity disorder, predominantly inattentive type Currently managed with Vyvanse  20 mg daily. Reports some benefit but not optimal control. - Increased Vyvanse  to 30 mg daily.  General Health Maintenance Routine health maintenance discussed, including breast self-exams and mammograms. - Ensure regular breast self-exams and mammograms are up to date.        Return in about 3 months (around 09/29/2024) for f/u ADD medication.     Ginger Patrick, MSN, APRN, FNP-C [facility]

## 2024-07-02 LAB — ANA W/REFLEX: ANA Titer 1: NEGATIVE

## 2024-07-04 ENCOUNTER — Ambulatory Visit: Payer: Self-pay | Admitting: Family

## 2024-07-13 ENCOUNTER — Encounter: Payer: Self-pay | Admitting: Family

## 2024-07-13 DIAGNOSIS — L249 Irritant contact dermatitis, unspecified cause: Secondary | ICD-10-CM

## 2024-07-13 DIAGNOSIS — I10 Essential (primary) hypertension: Secondary | ICD-10-CM

## 2024-07-13 MED ORDER — CLOBETASOL PROPIONATE 0.05 % EX CREA: 1.0000 | TOPICAL_CREAM | Freq: Two times a day (BID) | CUTANEOUS | 0 refills | Status: AC

## 2024-07-16 ENCOUNTER — Ambulatory Visit

## 2024-07-16 DIAGNOSIS — L404 Guttate psoriasis: Secondary | ICD-10-CM | POA: Diagnosis not present

## 2024-07-16 MED ORDER — TRIAMCINOLONE ACETONIDE 0.1 % EX OINT: TOPICAL_OINTMENT | CUTANEOUS | 2 refills | Status: AC

## 2024-07-16 MED ORDER — CLOBETASOL PROPIONATE 0.05 % EX OINT: TOPICAL_OINTMENT | CUTANEOUS | 2 refills | Status: AC

## 2024-07-16 NOTE — Patient Instructions (Signed)

## 2024-07-16 NOTE — Progress Notes (Signed)
    Subjective   Renee [name] is a 53 y.o. female who presents for the following: Contact dermatitis. Patient is new patient  Today patient reports: Patient states she has been breaking out all over body itchy comes and goes (wrists, back, back of legs). Occurring over a month, PCP prescribed doxycycline  and clobetasol . She is satisfied with the prescriptions.   Review of Systems:    No other skin or systemic complaints except as noted in HPI or Assessment and Plan.  The following portions of the chart were reviewed this encounter and updated as appropriate: medications, allergies, medical history  Relevant Medical History:  n/a   Objective  (SKPE) Well appearing patient in no apparent distress; mood and affect are within normal limits. 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 Condition is symptomatic and bothersome to patient. Patient is flaring and not currently at treatment goal.  - Educated, discussed chronic nature and waxing/waning. - Discussed association with psoriatic arthritis, monitor for increasing joint pain/stiffness, red/hot swollen fingers or joints; discussed if develops joint involvement will need referral to Rheumatology and possible escalation of therapy - Clobetasol  ointment 0.05% twice daily for 2 weeks to thick plaques. Can similarly retreat for flares. - start triamcinolone  ointment 0.1% twice daily to affected areas of skin - for more mild areas  Discussed side effect of potent topical steroids including atrophy,  dyspigmentation, striae, telangectasia, folliculitis, loss of skin pigment, hair growth, tachyphylaxis, risk of systemic absorption with missuse. - Discussed  can consider initiation of biologic or otezla/sotyktu at next visit if not improved - Denies any joint pain    Level of service outlined above   Patient instructions (SKPI)   Procedures, orders, diagnosis for this visit:    There are no diagnoses linked to this encounter.  Return to clinic: Return if symptoms worsen or fail to improve, for w/ Dr. Raymund.  I, Almetta Nora, RMA, am acting as scribe for Lauraine JAYSON Raymund, MD .   Documentation: I have reviewed the above documentation for accuracy and completeness, and I agree with the above.  Lauraine JAYSON Raymund, MD

## 2024-07-22 ENCOUNTER — Other Ambulatory Visit: Payer: Self-pay | Admitting: Family

## 2024-08-08 MED ORDER — AMLODIPINE BESYLATE 5 MG PO TABS: 5.0000 mg | ORAL_TABLET | Freq: Every day | ORAL | 0 refills | Status: AC

## 2024-08-08 NOTE — Addendum Note (Signed)
 Addended by: SEBASTIAN DANNA GRADE on: 08/08/2024 08:24 AM   Modules accepted: Orders

## 2024-08-13 ENCOUNTER — Encounter (HOSPITAL_COMMUNITY): Payer: Self-pay

## 2024-08-13 ENCOUNTER — Ambulatory Visit (HOSPITAL_COMMUNITY)
Admission: RE | Admit: 2024-08-13 | Discharge: 2024-08-13 | Disposition: A | Discharge status: Home / Self Care | Source: Ambulatory Visit | Attending: Family Medicine | Admitting: Family Medicine

## 2024-08-13 VITALS — BP 129/85 | HR 95 | Temp 98.5°F | Resp 18

## 2024-08-13 DIAGNOSIS — S46912A Strain of unspecified muscle, fascia and tendon at shoulder and upper arm level, left arm, initial encounter: Secondary | ICD-10-CM

## 2024-08-13 DIAGNOSIS — M25512 Pain in left shoulder: Secondary | ICD-10-CM | POA: Diagnosis not present

## 2024-08-13 MED ORDER — CYCLOBENZAPRINE HCL 10 MG PO TABS: 10.0000 mg | ORAL_TABLET | Freq: Two times a day (BID) | ORAL | 0 refills | Status: AC | PRN

## 2024-08-13 MED ORDER — LIDOCAINE 5 % EX PTCH: 1.0000 | MEDICATED_PATCH | CUTANEOUS | 0 refills | Status: AC

## 2024-08-13 NOTE — ED Provider Notes (Signed)
 " [facility]    CSN: [id number] Arrival date & time: 08/13/24  1835      History   Chief Complaint Chief Complaint  Patient presents with   Shoulder Pain    Ongoing for 2 weeks - Entered by patient    HPI Renee [name] is a 54 y.o. female.   This 54 year old female is being seen for left sided shoulder pain radiating down arm since riding go-carts 2 weeks ago.  She denies known injury, trauma.  She says after taking 1 turn riding go carts, she noticed left shoulder pain.  She reports intermittent tingling in her hand.  She denies neck pain.  She denies headache, dizziness.  She denies chest pain, shortness of breath.  She has been taking Aleve  without significant relief of symptoms.  She has been applying heat for the last 2 weeks and just recently began alternating heat and ice which she says is effective in improving her symptoms.   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)   Osteopenia 09/01/2014   PTSD (post-traumatic stress disorder) 08/15/2014   Rupture of posterior cruciate ligament of left knee 04/20/2016   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   Thyroid  disease     Patient Active Problem List   Diagnosis Date Noted   Diarrhea of presumed infectious origin 02/02/2024   Anxiety state 09/21/2023   History of breast cancer 09/02/2023   Low serum vitamin B12 09/02/2023   History of epilepsy 09/02/2023   Recurrent  major depressive disorder, in partial remission 02/17/2023   Bicuspid aortic valve 09/21/2022   History of malignant neoplasm of breast 09/10/2022   Primary hypertension 09/10/2022   Contact dermatitis of scalp 09/10/2022   Heart murmur 06/25/2022   ASCUS of cervix with negative high risk HPV 06/25/2022   History of left breast cancer 06/17/2022   History of Graves' disease 06/17/2022   Vitamin D  deficiency 06/17/2022   Bunion, left 06/17/2022   Atherosclerosis of aorta 07/17/2018   Monoallelic mutation of PALB2 gene    Macrocytosis without anemia 09/27/2015   ADD (attention deficit disorder) 11/07/2014   Osteopenia 09/01/2014   Depression, major, single episode, in partial remission 08/15/2014   Posttraumatic stress disorder 08/15/2014   Acquired absence of nipple 02/01/2012   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    Status post breast reconstruction 05/07/2011   Malignant neoplasm of breast (HCC) 05/07/2011   Hypothyroidism 02/24/2009   Hyperlipidemia 04/01/2008    Past Surgical History:  Procedure Laterality Date   BREAST CAPSULECTOMY WITH IMPLANT EXCHANGE Left 08/19/2011   BREAST RECONSTRUCTION Left 01/15/2011   MINOR IRRIGATION AND DEBRIDEMENT OF WOUND Left 03/05/2011   back and left breast   MODIFIED RADICAL MASTECTOMY Left 10/08/2009   PLACEMENT OF BREAST IMPLANTS Right 08/19/2011   PORT-A-CATH REMOVAL  07/15/2010   PORTACATH PLACEMENT  11/14/2009   with left breast wound revision    OB History   No obstetric history on file.      Home Medications    Prior to Admission medications  Medication Sig Start Date End Date Taking? Authorizing Provider  amLODipine  (NORVASC ) 5 MG tablet Take 1 tablet (5 mg total) by mouth daily. 08/08/24   Dugal, Tabitha, FNP  buPROPion  (WELLBUTRIN  XL) 300 MG 24 hr tablet TAKE 1 TABLET BY MOUTH EVERY DAY 05/28/24   Dugal, Tabitha, FNP  clobetasol  cream (TEMOVATE ) 0.05 % Apply 1 Application topically 2 (two) times daily.  07/13/24   Dugal, Tabitha, FNP  clobetasol  ointment (TEMOVATE ) 0.05 % Apply 1 gram topically to affected area of skin twice daily. Stop once resolved and restart as needed for flares. Avoid use on face, armpits, groin unless otherwise indicated. 07/16/24   Raymund Lauraine BROCKS, MD  hydrOXYzine  (VISTARIL ) 25 MG capsule Take 1 capsule (25 mg total) by mouth every 8 (eight) hours as needed for anxiety. 09/21/23   Dugal, Tabitha, FNP  ibuprofen  (ADVIL ) 800 MG tablet Take 1 tablet (800 mg total) by mouth 3 (three) times daily. 11/25/22   Janit Thresa HERO, DPM  lisdexamfetamine  (VYVANSE ) 30 MG capsule Take 1 capsule (30 mg total) by mouth daily. 06/29/24   Dugal, Tabitha, FNP  lisdexamfetamine  (VYVANSE ) 30 MG capsule Take 1 capsule (30 mg total) by mouth daily. 07/20/24   Dugal, Tabitha, FNP  lisdexamfetamine  (VYVANSE ) 30 MG capsule Take 1 capsule (30 mg total) by mouth daily. 08/10/24   Corwin Antu, FNP  omeprazole  (PRILOSEC) 20 MG capsule TAKE 1 CAPSULE BY MOUTH EVERY DAY 07/25/23   Corwin Antu, FNP  SYNTHROID  125 MCG tablet Take one po every day for six days off the seventh day 02/13/24   Corwin Antu, FNP  triamcinolone  ointment (KENALOG ) 0.1 % Apply 7 grams twice daily to affected areas of skin. Stop once resolved and restart as needed for flares. Avoid use on face, armpits, groin unless otherwise indicated. 07/16/24   Raymund Lauraine BROCKS, MD  valsartan  (DIOVAN ) 80 MG tablet TAKE 1 TABLET BY MOUTH EVERY DAY 07/23/24   Corwin Antu, FNP    Family History Family History  Problem Relation Age of Onset   Hypertension Mother    Heart disease Mother    COPD Mother    Other Mother        Cow valve   Diabetes Father    Stroke Father 65   Hypertension Father    Melanoma Father    Breast cancer Sister 67       currently 39; PALB2 mutation/ deceased   Hypertension Brother    Hypertension Maternal Grandmother    Diabetes Paternal Grandmother    Lung cancer Paternal Grandfather        deceased 85s; smoker     Social History Social History[1]   Allergies   Amlodipine  and Prednisone    Review of Systems Review of Systems  Constitutional:  Positive for activity change. Negative for chills and fever.  Respiratory:  Negative for shortness of breath.   Cardiovascular:  Negative for chest pain.  Musculoskeletal:  Positive for arthralgias and myalgias. Negative for back pain, neck pain and neck stiffness.  Skin:  Negative for color change and rash.  Neurological:  Positive for numbness (Intermittent numbness and tingling left hand). Negative for dizziness, weakness and headaches.  All other systems reviewed and are negative.    Physical Exam Triage Vital Signs ED Triage Vitals  Encounter Vitals Group  BP 08/13/24 1916 129/85     Girls Systolic BP Percentile --      Girls Diastolic BP Percentile --      Boys Systolic BP Percentile --      Boys Diastolic BP Percentile --      Pulse Rate 08/13/24 1916 (!) 109     Resp 08/13/24 1916 18     Temp 08/13/24 1916 98.5 F (36.9 C)     Temp Source 08/13/24 1916 Oral     SpO2 08/13/24 1916 96 %     Weight --      Height --      Head Circumference --      Peak Flow --      Pain Score 08/13/24 1915 5     Pain Loc --      Pain Education --      Exclude from Growth Chart --    No data found.  Updated Vital Signs BP 129/85 (BP Location: Left Arm)   Pulse (!) 109   Temp 98.5 F (36.9 C) (Oral)   Resp 18   SpO2 96%   Visual Acuity Right Eye Distance:   Left Eye Distance:   Bilateral Distance:    Right Eye Near:   Left Eye Near:    Bilateral Near:     Physical Exam Vitals and nursing note reviewed.  Constitutional:      General: She is not in acute distress.    Appearance: She is well-developed.     Comments: Pleasant female appearing stated age found sitting in chair in no acute distress.  HENT:     Head: Normocephalic and atraumatic.  Eyes:     Conjunctiva/sclera: Conjunctivae normal.  Cardiovascular:     Rate and  Rhythm: Normal rate and regular rhythm.     Heart sounds: Normal heart sounds. No murmur heard. Pulmonary:     Effort: Pulmonary effort is normal. No respiratory distress.     Breath sounds: Normal breath sounds.  Abdominal:     Palpations: Abdomen is soft.     Tenderness: There is no abdominal tenderness.  Musculoskeletal:     Left shoulder: Tenderness present. Decreased range of motion. Normal strength. Normal pulse.     Cervical back: Neck supple.  Skin:    General: Skin is warm and dry.     Capillary Refill: Capillary refill takes less than 2 seconds.  Neurological:     Mental Status: She is alert.     Motor: No weakness.  Psychiatric:        Mood and Affect: Mood normal.      UC Treatments / Results  Labs (all labs ordered are listed, but only abnormal results are displayed) Labs Reviewed - No data to display  EKG   Radiology No results found.  Procedures Procedures (including critical care time)  Medications Ordered in UC Medications - No data to display  Initial Impression / Assessment and Plan / UC Course  I have reviewed the triage vital signs and the nursing notes.  Pertinent labs & imaging results that were available during my care of the patient were reviewed by me and considered in my medical decision making (see chart for details).     Vitals and triage reviewed, patient is hemodynamically stable.  Presentation consistent with shoulder strain.  She is placed in a shoulder sling for comfort and support.  She is given prescription for cyclobenzaprine , lidocaine  patches.  Advised to alternate heat and ice.  Orthopedic follow-up  information given.  Plan of care, follow-up care, return precautions given, no questions at this time.  She declines a work note. Final Clinical Impressions(s) / UC Diagnoses   Final diagnoses:  None   Discharge Instructions   None    ED Prescriptions   None    PDMP not reviewed this encounter.    [1]  Social  History Tobacco Use   Smoking status: Former    Current packs/day: 0.00    Average packs/day: 0.5 packs/day for 14.0 years (7.0 ttl pk-yrs)    Types: Cigarettes    Start date: 09/22/1996    Quit date: 09/22/2010    Years since quitting: 13.9   Smokeless tobacco: Never  Vaping Use   Vaping status: Never Used  Substance Use Topics   Alcohol use: Not Currently    Comment: rare use   Drug use: No     Lennice Jon BROCKS, FNP 08/13/24 2006  "

## 2024-08-13 NOTE — Discharge Instructions (Addendum)
 Your shoulder pain is likely due to a muscle strain which will improve on its own with time.   We have placed you in a shoulder sling for comfort and support.  You may take tylenol  and/or ibuprofen  as needed for aches and pains.  Take muscle relaxer as needed for muscle spasm, mostly take this at bedtime as this medicine can cause drowsiness.  Apply lidocaine  patch topically once a day, remove after 12 hours.  Alternate heat t and ice o the pulled muscle 20 minutes on 20 minutes off as needed.  Perform gentle exercises and stretches to area of tenderness.  I would like for you to rest, however I do not want you to avoid moving the area. Movement and stretching will help with healing.  Orthopedic follow-up if needed: [facility] [address] [place], [place]  [address] [phone]  Red flag symptoms to watch out for are numbness/tingling, weakness, and/or worsening pain that does not respond well to medicines.  Follow-up with your primary care provider or return to urgent care if your symptoms do not improve in the next 3 to 4 days with medications and interventions recommended today. If your symptoms are severe (red flag), please go to the emergency room.

## 2024-08-13 NOTE — ED Triage Notes (Signed)
 Pt c/o lt shoulder pain radiating down arm with hand numbness x2wks after riding go carts. States taken aleve  with no relief.
